# Patient Record
Sex: Female | Born: 1950 | Race: White | Hispanic: No | Marital: Married | Smoking: Never smoker
Health system: Southern US, Community
[De-identification: ages and names within clinical notes are randomized; demographics above are authoritative.]

## PROBLEM LIST (undated history)

## (undated) DIAGNOSIS — F329 Major depressive disorder, single episode, unspecified: Secondary | ICD-10-CM

## (undated) DIAGNOSIS — J45909 Unspecified asthma, uncomplicated: Secondary | ICD-10-CM

## (undated) DIAGNOSIS — N952 Postmenopausal atrophic vaginitis: Secondary | ICD-10-CM

## (undated) DIAGNOSIS — M5136 Other intervertebral disc degeneration, lumbar region: Secondary | ICD-10-CM

## (undated) DIAGNOSIS — F32A Depression, unspecified: Secondary | ICD-10-CM

## (undated) DIAGNOSIS — R32 Unspecified urinary incontinence: Secondary | ICD-10-CM

## (undated) DIAGNOSIS — J309 Allergic rhinitis, unspecified: Secondary | ICD-10-CM

## (undated) DIAGNOSIS — N289 Disorder of kidney and ureter, unspecified: Secondary | ICD-10-CM

## (undated) DIAGNOSIS — M5412 Radiculopathy, cervical region: Secondary | ICD-10-CM

## (undated) DIAGNOSIS — F419 Anxiety disorder, unspecified: Secondary | ICD-10-CM

## (undated) DIAGNOSIS — S83249A Other tear of medial meniscus, current injury, unspecified knee, initial encounter: Secondary | ICD-10-CM

## (undated) DIAGNOSIS — G4700 Insomnia, unspecified: Secondary | ICD-10-CM

## (undated) DIAGNOSIS — M51369 Other intervertebral disc degeneration, lumbar region without mention of lumbar back pain or lower extremity pain: Secondary | ICD-10-CM

## (undated) DIAGNOSIS — H269 Unspecified cataract: Secondary | ICD-10-CM

## (undated) HISTORY — DX: Unspecified cataract: H26.9

## (undated) HISTORY — DX: Insomnia, unspecified: G47.00

## (undated) HISTORY — PX: HAMMER TOE SURGERY: SHX385

## (undated) HISTORY — PX: BUNIONECTOMY: SHX129

## (undated) HISTORY — DX: Radiculopathy, cervical region: M54.12

## (undated) HISTORY — PX: CATARACT EXTRACTION: SUR2

## (undated) HISTORY — DX: Unspecified asthma, uncomplicated: J45.909

## (undated) HISTORY — DX: Major depressive disorder, single episode, unspecified: F32.9

## (undated) HISTORY — DX: Other tear of medial meniscus, current injury, unspecified knee, initial encounter: S83.249A

## (undated) HISTORY — DX: Postmenopausal atrophic vaginitis: N95.2

## (undated) HISTORY — PX: LAMINECTOMY: SHX219

## (undated) HISTORY — PX: PARTIAL HYSTERECTOMY: SHX80

## (undated) HISTORY — DX: Depression, unspecified: F32.A

## (undated) HISTORY — DX: Unspecified urinary incontinence: R32

## (undated) HISTORY — PX: MANDIBLE FRACTURE SURGERY: SHX706

## (undated) HISTORY — DX: Allergic rhinitis, unspecified: J30.9

## (undated) HISTORY — DX: Anxiety disorder, unspecified: F41.9

## (undated) HISTORY — PX: BLADDER SUSPENSION: SHX72

---

## 2004-04-01 ENCOUNTER — Other Ambulatory Visit: Payer: Self-pay

## 2004-04-25 ENCOUNTER — Other Ambulatory Visit: Payer: Self-pay

## 2005-03-10 ENCOUNTER — Ambulatory Visit: Payer: Self-pay | Admitting: Unknown Physician Specialty

## 2005-03-11 ENCOUNTER — Ambulatory Visit: Payer: Self-pay | Admitting: Unknown Physician Specialty

## 2005-06-30 ENCOUNTER — Emergency Department: Payer: Self-pay | Admitting: Emergency Medicine

## 2006-06-08 ENCOUNTER — Ambulatory Visit: Payer: Self-pay | Admitting: Unknown Physician Specialty

## 2006-10-22 ENCOUNTER — Other Ambulatory Visit: Payer: Self-pay

## 2006-10-22 ENCOUNTER — Inpatient Hospital Stay: Payer: Self-pay

## 2010-04-17 ENCOUNTER — Ambulatory Visit: Payer: Self-pay | Admitting: Family Medicine

## 2010-04-24 ENCOUNTER — Ambulatory Visit: Payer: Self-pay | Admitting: Nephrology

## 2011-07-11 ENCOUNTER — Ambulatory Visit: Payer: Self-pay | Admitting: Internal Medicine

## 2011-07-11 ENCOUNTER — Observation Stay: Payer: Self-pay | Admitting: Internal Medicine

## 2011-08-13 LAB — HM PAP SMEAR

## 2013-08-26 LAB — HM COLONOSCOPY

## 2013-09-04 ENCOUNTER — Ambulatory Visit: Payer: Self-pay | Admitting: Gastroenterology

## 2013-10-30 LAB — HM MAMMOGRAPHY

## 2014-01-10 ENCOUNTER — Ambulatory Visit: Payer: Self-pay | Admitting: Family Medicine

## 2014-01-16 ENCOUNTER — Ambulatory Visit: Payer: Self-pay | Admitting: Family Medicine

## 2014-02-05 ENCOUNTER — Ambulatory Visit: Payer: Self-pay | Admitting: Family Medicine

## 2014-03-01 ENCOUNTER — Other Ambulatory Visit: Payer: Self-pay | Admitting: Podiatry

## 2014-05-04 ENCOUNTER — Emergency Department: Payer: Self-pay | Admitting: Emergency Medicine

## 2014-05-04 LAB — COMPREHENSIVE METABOLIC PANEL
ALT: 17 U/L (ref 12–78)
Albumin: 4.1 g/dL (ref 3.4–5.0)
Alkaline Phosphatase: 58 U/L
Anion Gap: 8 (ref 7–16)
BUN: 13 mg/dL (ref 7–18)
Bilirubin,Total: 0.6 mg/dL (ref 0.2–1.0)
CHLORIDE: 101 mmol/L (ref 98–107)
CO2: 29 mmol/L (ref 21–32)
Calcium, Total: 9 mg/dL (ref 8.5–10.1)
Creatinine: 0.92 mg/dL (ref 0.60–1.30)
EGFR (Non-African Amer.): >60
Glucose: 88 mg/dL (ref 65–99)
OSMOLALITY: 275 (ref 275–301)
POTASSIUM: 3.9 mmol/L (ref 3.5–5.1)
SGOT(AST): 22 U/L (ref 15–37)
SODIUM: 138 mmol/L (ref 136–145)
TOTAL PROTEIN: 7.3 g/dL (ref 6.4–8.2)

## 2014-05-04 LAB — SALICYLATE LEVEL: Salicylates, Serum: <1.7 mg/dL

## 2014-05-04 LAB — TSH: Thyroid Stimulating Horm: 2.99 u[IU]/mL

## 2014-05-04 LAB — DRUG SCREEN, URINE

## 2014-05-04 LAB — CBC
HCT: 44.6 % (ref 35.0–47.0)
HGB: 14.2 g/dL (ref 12.0–16.0)
MCH: 26.9 pg (ref 26.0–34.0)
MCHC: 31.8 g/dL — AB (ref 32.0–36.0)
MCV: 85 fL (ref 80–100)
Platelet: 273 10*3/uL (ref 150–440)
RBC: 5.25 10*6/uL — AB (ref 3.80–5.20)
RDW: 14.3 % (ref 11.5–14.5)
WBC: 7 10*3/uL (ref 3.6–11.0)

## 2014-05-04 LAB — ETHANOL: Ethanol: <3 mg/dL

## 2014-05-04 LAB — ACETAMINOPHEN LEVEL

## 2014-09-13 ENCOUNTER — Emergency Department: Payer: Self-pay | Admitting: Emergency Medicine

## 2015-02-16 NOTE — Consult Note (Signed)
PATIENT NAME:  Sharon Collins, Sharon Collins MR#:  [id number] DATE OF BIRTH:  1950/12/28  DATE OF CONSULTATION:  05/04/2014  REFERRING PHYSICIAN:   CONSULTING PHYSICIAN:  Gonzella Lex, MD  IDENTIFYING INFORMATION AND REASON FOR CONSULTATION: A 64 year old woman came to the Emergency Room directly from her doctor's office after having a panic attack. Consultation for assessment of need of psychiatric intervention.   HISTORY OF PRESENT ILLNESS:  Patient interviewed in the Emergency Room. Apparently, she had gone to her primary care doctor that day and while in the office had a panic attack. Evidently, it was pretty dramatic and the doctor was disturbed and sent her to the Emergency Room. The patient says that she is having panic attacks maybe about 1 time a week, which is more frequent than usual. Describes them as being a feeling that she cannot breathe, extreme anxiety, go on for several minutes. Of note, the patient says that she has been out of her Klonopin for about 3-6 days before having this panic attack. Otherwise, she has been continuing to take her usual medicine. The patient reports she has no specific new stress. Chronically worries and feels anxious about her family in a general way. She is denying any suicidal or homicidal ideation whatsoever when I saw her.   PAST PSYCHIATRIC HISTORY: No previous hospitalizations. No history of suicide attempts. She has been treated with medication through Primary Care largely for her anxiety and mood problems and has been fairly stable on them.   FAMILY HISTORY: Father committed suicide.   PAST MEDICAL HISTORY: The patient has seasonal allergies, has some chronic arthritic pain.   SOCIAL HISTORY: Lives with her family. Seems to have a fairly large extended family, including adult children and her brother in the area.   CURRENT MEDICATIONS: Clonazepam 0.5 mg 1 to 2 a day p.r.n., but she has been out of it for a few days; Lamictal 200 mg per day; estradiol 1 mg  per day; Zoloft 50 mg per day; Singulair 10 mg at night; p.r.n. oxycodone for her chronic pain.   SUBSTANCE ABUSE HISTORY: Denies drinking. Denies any abuse of her prescription medicine.   ALLERGIES: CODEINE.   REVIEW OF SYSTEMS:  Currently states she is mildly anxious.  Denies suicidal ideation. Denies hallucinations.  Denies having a panic attack currently. No specific physical problems.  Chronic back and joint pain.   MENTAL STATUS EXAMINATION:  A patient who looks her stated age, cooperative and pleasant with the interview. Good eye contact. Psychomotor activity normal. Speech normal rate, tone, and volume. Affect mildly anxious but reactive. Mood stated as being okay. Thoughts are lucid. No loosening of associations or delusions noted. Denies auditory or visual hallucinations. Denies suicidal or homicidal ideation. Able to recall 3/3 objects immediately and at 1 minute. Alert and oriented x 4. Judgment and insight appear to be adequate.   VITAL SIGNS: In the Emergency Room, blood pressure 114/47, respirations 18, pulse 55, temperature 97.9.   LABORATORY RESULTS: TSH normal. Salicylates negative.  CBC: Slightly elevated red count but otherwise normal. Alcohol level negative. Chemistry panel all normal. Acetaminophen negative. Drug screen negative.   ASSESSMENT:  A 64 year old woman who has panic attacks occasionally on top of what sounds like probably generalized anxiety disorder. Has been getting medication from her primary care doctor. Some increased frequency recently part of which might be due to having run out of her clonazepam, not acutely dangerous, not requiring hospital level treatment. Has an appropriate and safe place to live.  TREATMENT PLAN: Psychiatric education completed with the patient. Suggest that she consider seeking out referral to a psychiatrist for therapy and more frequent management of her anxiety if she wishes.  Discussed the case with the Emergency Room doctor. The  patient can be discharged from the Emergency Room.  No new medication. No change to medication plan.   DIAGNOSIS, PRINCIPAL AND PRIMARY:  AXIS I: Panic disorder without agoraphobia.   SECONDARY DIAGNOSES:  AXIS I: Generalized anxiety disorder.  AXIS II: Deferred.  AXIS III: Chronic pain.  AXIS IV: Moderate from some family conflict.   AXIS V: Functioning at time of discharge, 55.   ____________________________ Gonzella Lex, MD jtc:dd D: 05/08/2014 00:52:30 ET T: 05/08/2014 04:29:01 ET JOB#: [id number]  cc: Gonzella Lex, MD, <Dictator> Gonzella Lex MD ELECTRONICALLY SIGNED 05/13/2014 12:39

## 2015-02-20 ENCOUNTER — Encounter: Payer: Self-pay | Admitting: *Deleted

## 2015-02-21 ENCOUNTER — Encounter: Payer: Self-pay | Admitting: *Deleted

## 2015-02-21 ENCOUNTER — Ambulatory Visit: Payer: Self-pay | Admitting: Cardiovascular Disease

## 2015-02-22 ENCOUNTER — Encounter: Payer: Self-pay | Admitting: *Deleted

## 2015-05-27 ENCOUNTER — Other Ambulatory Visit: Payer: Self-pay | Admitting: Unknown Physician Specialty

## 2015-05-27 ENCOUNTER — Telehealth: Payer: Self-pay | Admitting: Family Medicine

## 2015-05-27 MED ORDER — MONTELUKAST SODIUM 10 MG PO TABS: 10.0000 mg | ORAL_TABLET | Freq: Every day | ORAL | Status: DC

## 2015-05-27 NOTE — Telephone Encounter (Signed)
She had a years supply in [facility] sent to her local pharmacy, but she needs a new rx for a 90 day supply sent to her mail order pharmacy.

## 2015-05-27 NOTE — Telephone Encounter (Signed)
E-fax came through from [facility] for refill on: Rx: montelukast (SINGULAIR) 10 MG tablet Copy in basket.

## 2015-08-07 ENCOUNTER — Other Ambulatory Visit: Payer: Self-pay | Admitting: Family Medicine

## 2015-12-17 ENCOUNTER — Ambulatory Visit: Payer: Self-pay | Admitting: Family Medicine

## 2015-12-18 ENCOUNTER — Encounter: Payer: Self-pay | Admitting: Unknown Physician Specialty

## 2015-12-18 ENCOUNTER — Ambulatory Visit (INDEPENDENT_AMBULATORY_CARE_PROVIDER_SITE_OTHER): Payer: Self-pay | Admitting: Unknown Physician Specialty

## 2015-12-18 VITALS — BP 102/72 | HR 70 | Temp 98.6°F | Ht 63.0 in | Wt 160.6 lb

## 2015-12-18 DIAGNOSIS — J0100 Acute maxillary sinusitis, unspecified: Secondary | ICD-10-CM

## 2015-12-18 DIAGNOSIS — Z23 Encounter for immunization: Secondary | ICD-10-CM

## 2015-12-18 MED ORDER — AMOXICILLIN-POT CLAVULANATE 875-125 MG PO TABS: 1.0000 | ORAL_TABLET | Freq: Two times a day (BID) | ORAL | Status: DC

## 2015-12-18 MED ORDER — ETODOLAC 500 MG PO TABS: 500.0000 mg | ORAL_TABLET | Freq: Two times a day (BID) | ORAL | Status: DC

## 2015-12-18 NOTE — Progress Notes (Signed)
BP 102/72 mmHg  Pulse 70  Temp(Src) 98.6 F (37 C)  Ht 5\' 3"  (1.6 m)  Wt 160 lb 9.6 oz (72.848 kg)  BMI 28.46 kg/m2  SpO2 97%  LMP  (LMP Unknown)   Subjective:    Patient ID: [name], female    DOB: Aug 18, 1951, 65 y.o.   MRN: [id number]  HPI: [name] is a 65 y.o. female  Chief Complaint  Patient presents with  . URI    pt states she has had nasal congestion, sinus pressure, headache, cough, and teeth pressure. States symptoms started 3 weeks ago.    Sinusitis This is a new (x 3 weeks and noticed left with green mucous,pressure, and tooth pain) problem. The current episode started 1 to 4 weeks ago. The problem has been gradually worsening since onset. There has been no fever. The fever has been present for less than 1 day. The pain is moderate. Associated symptoms include congestion, coughing, ear pain, headaches, sneezing and a sore throat. Pertinent negatives include no chills. Past treatments include saline sprays. The treatment provided mild relief.   Back pain Needs a refill of Lodine. She is also taking Gabapentin.     Relevant past medical, surgical, family and social history reviewed and updated as indicated. Interim medical history since our last visit reviewed. Allergies and medications reviewed and updated.  Review of Systems  Constitutional: Negative for chills.  HENT: Positive for congestion, ear pain, sneezing and sore throat.   Respiratory: Positive for cough.   Neurological: Positive for headaches.    Per HPI unless specifically indicated above     Objective:    BP 102/72 mmHg  Pulse 70  Temp(Src) 98.6 F (37 C)  Ht 5\' 3"  (1.6 m)  Wt 160 lb 9.6 oz (72.848 kg)  BMI 28.46 kg/m2  SpO2 97%  LMP  (LMP Unknown)  Wt Readings from Last 3 Encounters:  12/18/15 160 lb 9.6 oz (72.848 kg)  01/30/15 160 lb (72.576 kg)    Physical Exam  Constitutional: She is oriented to person, place, and time. She appears well-developed and  well-nourished. No distress.  HENT:  Head: Normocephalic and atraumatic.  Right Ear: Tympanic membrane and ear canal normal.  Left Ear: Tympanic membrane and ear canal normal.  Nose: No rhinorrhea. Right sinus exhibits maxillary sinus tenderness. Right sinus exhibits no frontal sinus tenderness. Left sinus exhibits maxillary sinus tenderness. Left sinus exhibits no frontal sinus tenderness.  Eyes: Conjunctivae and lids are normal. Right eye exhibits no discharge. Left eye exhibits no discharge. No scleral icterus.  Cardiovascular: Normal rate and regular rhythm.   Pulmonary/Chest: Effort normal and breath sounds normal. No respiratory distress.  Abdominal: Normal appearance. There is no splenomegaly or hepatomegaly.  Musculoskeletal: Normal range of motion.  Neurological: She is alert and oriented to person, place, and time.  Skin: Skin is intact. No rash noted. No pallor.  Psychiatric: She has a normal mood and affect. Her behavior is normal. Judgment and thought content normal.    Results for orders placed or performed in visit on 12/17/15  HM MAMMOGRAPHY  Result Value Ref Range   [facility] Mammogram [facility]   HM COLONOSCOPY  Result Value Ref Range   HM Colonoscopy [facility]       Assessment & Plan:   Problem List Items Addressed This Visit    None    Visit Diagnoses    Acute maxillary sinusitis, recurrence not specified    -  Primary  Relevant Medications    amoxicillin-clavulanate (AUGMENTIN) 875-125 MG tablet        Follow up plan: Return if symptoms worsen or fail to improve.

## 2016-05-07 ENCOUNTER — Other Ambulatory Visit: Payer: Self-pay | Admitting: Unknown Physician Specialty

## 2016-05-11 ENCOUNTER — Other Ambulatory Visit: Payer: Self-pay | Admitting: Unknown Physician Specialty

## 2016-05-11 MED ORDER — ETODOLAC 500 MG PO TABS: 500.0000 mg | ORAL_TABLET | Freq: Two times a day (BID) | ORAL | Status: DC

## 2016-05-11 NOTE — Telephone Encounter (Signed)
Pt has appt scheduled for 05/18/16 and would like to know if she could have enough etodolac (LODINE) 500 MG tablet called in to her pharmacy to las until her appt.

## 2016-05-11 NOTE — Telephone Encounter (Signed)
Routing to provider  

## 2016-05-18 ENCOUNTER — Encounter: Payer: Self-pay | Admitting: Unknown Physician Specialty

## 2016-05-18 ENCOUNTER — Ambulatory Visit (INDEPENDENT_AMBULATORY_CARE_PROVIDER_SITE_OTHER): Payer: Self-pay | Admitting: Unknown Physician Specialty

## 2016-05-18 VITALS — BP 110/70 | Temp 98.0°F | Ht 63.3 in | Wt 164.6 lb

## 2016-05-18 DIAGNOSIS — Z5181 Encounter for therapeutic drug level monitoring: Secondary | ICD-10-CM

## 2016-05-18 DIAGNOSIS — M545 Low back pain: Secondary | ICD-10-CM

## 2016-05-18 MED ORDER — ETODOLAC 500 MG PO TABS: 500.0000 mg | ORAL_TABLET | Freq: Two times a day (BID) | ORAL | 3 refills | Status: DC

## 2016-05-18 MED ORDER — ONDANSETRON 4 MG PO TBDP: 4.0000 mg | ORAL_TABLET | Freq: Three times a day (TID) | ORAL | 3 refills | Status: DC | PRN

## 2016-05-18 NOTE — Progress Notes (Signed)
BP 110/70 (BP Location: Left Arm, Patient Position: Sitting, Cuff Size: Large)   Temp 98 F (36.7 C)   Ht 5' 3.3" (1.608 m)   Wt 164 lb 9.6 oz (74.7 kg)   LMP  (LMP Unknown)   SpO2 98%   BMI 28.88 kg/m    Subjective:    Patient ID: Sharon Collins, female    DOB: 12-Feb-1951, 65 y.o.   MRN: [id number]  HPI: Sharon Collins is a 65 y.o. female  Chief Complaint  Patient presents with  . Medication Refill    pt states she needs refill on etodolac and ondansetron   Pain Pt is here for follow up of her back pain.  She reports no changes.  She takes the Meyers for pain pretty much all over.  She has DDD and feels like bees are stinging in her hands and feet.  She takes Oxycodone every 2-3 weeks.  She would like a refill of Ondansetron that she needs to take in case she takes the Oxycodone.  She currently has no insurance and will turn 29 in November.    Relevant past medical, surgical, family and social history reviewed and updated as indicated. Interim medical history since our last visit reviewed. Allergies and medications reviewed and updated.  Review of Systems  Per HPI unless specifically indicated above     Objective:    BP 110/70 (BP Location: Left Arm, Patient Position: Sitting, Cuff Size: Large)   Temp 98 F (36.7 C)   Ht 5' 3.3" (1.608 m)   Wt 164 lb 9.6 oz (74.7 kg)   LMP  (LMP Unknown)   SpO2 98%   BMI 28.88 kg/m   Wt Readings from Last 3 Encounters:  05/18/16 164 lb 9.6 oz (74.7 kg)  12/18/15 160 lb 9.6 oz (72.8 kg)  01/30/15 160 lb (72.6 kg)    Physical Exam  Constitutional: She is oriented to person, place, and time. She appears well-developed and well-nourished.  HENT:  Head: Normocephalic and atraumatic.  Eyes: Pupils are equal, round, and reactive to light. Right eye exhibits no discharge. Left eye exhibits no discharge. No scleral icterus.  Neck: Normal range of motion. Neck supple. Carotid bruit is not present. No thyromegaly present.    Cardiovascular: Normal rate, regular rhythm and normal heart sounds.  Exam reveals no gallop and no friction rub.   No murmur heard. Pulmonary/Chest: Effort normal and breath sounds normal. No respiratory distress. She has no wheezes. She has no rales.  Abdominal: Soft. Bowel sounds are normal. There is no tenderness. There is no rebound.  Genitourinary: No breast swelling, tenderness or discharge.  Musculoskeletal: Normal range of motion.  Lymphadenopathy:    She has no cervical adenopathy.  Neurological: She is alert and oriented to person, place, and time.  Skin: Skin is warm, dry and intact. No rash noted.  Psychiatric: She has a normal mood and affect. Her speech is normal and behavior is normal. Judgment and thought content normal. Cognition and memory are normal.    Results for orders placed or performed in visit on 05/18/16  HM PAP SMEAR  Result Value Ref Range   HM Pap smear [facility]       Assessment & Plan:   Problem List Items Addressed This Visit    None    Visit Diagnoses    Low back pain without sciatica, unspecified back pain laterality    -  Primary   Relevant Medications   etodolac (LODINE) 500  MG tablet   Medication monitoring encounter       Relevant Orders   Comprehensive metabolic panel       Follow up plan: Return for physical in November.

## 2016-05-19 ENCOUNTER — Encounter: Payer: Self-pay | Admitting: Unknown Physician Specialty

## 2016-05-19 LAB — COMPREHENSIVE METABOLIC PANEL
A/G RATIO: 2.2 (ref 1.2–2.2)
ALBUMIN: 4.2 g/dL (ref 3.6–4.8)
ALT: 14 IU/L (ref 0–32)
AST: 19 IU/L (ref 0–40)
Alkaline Phosphatase: 64 IU/L (ref 39–117)
BILIRUBIN TOTAL: 0.2 mg/dL (ref 0.0–1.2)
BUN / CREAT RATIO: 11 — AB (ref 12–28)
BUN: 9 mg/dL (ref 8–27)
CHLORIDE: 100 mmol/L (ref 96–106)
CO2: 25 mmol/L (ref 18–29)
Calcium: 9.2 mg/dL (ref 8.7–10.3)
Creatinine, Ser: 0.83 mg/dL (ref 0.57–1.00)
GFR calc non Af Amer: 75 mL/min/{1.73_m2} (ref >59)
GFR, EST AFRICAN AMERICAN: 86 mL/min/{1.73_m2} (ref >59)
Globulin, Total: 1.9 g/dL (ref 1.5–4.5)
Glucose: 77 mg/dL (ref 65–99)
POTASSIUM: 4.1 mmol/L (ref 3.5–5.2)
Sodium: 141 mmol/L (ref 134–144)
TOTAL PROTEIN: 6.1 g/dL (ref 6.0–8.5)

## 2016-05-19 NOTE — Progress Notes (Signed)
Normal labs.  Patient notified by letter.

## 2016-09-01 DIAGNOSIS — H25043 Posterior subcapsular polar age-related cataract, bilateral: Secondary | ICD-10-CM | POA: Diagnosis not present

## 2016-09-01 DIAGNOSIS — H2512 Age-related nuclear cataract, left eye: Secondary | ICD-10-CM | POA: Diagnosis not present

## 2016-09-01 DIAGNOSIS — H18413 Arcus senilis, bilateral: Secondary | ICD-10-CM | POA: Diagnosis not present

## 2016-09-01 DIAGNOSIS — H02839 Dermatochalasis of unspecified eye, unspecified eyelid: Secondary | ICD-10-CM | POA: Diagnosis not present

## 2016-09-01 DIAGNOSIS — H2513 Age-related nuclear cataract, bilateral: Secondary | ICD-10-CM | POA: Diagnosis not present

## 2016-09-03 DIAGNOSIS — M47816 Spondylosis without myelopathy or radiculopathy, lumbar region: Secondary | ICD-10-CM | POA: Diagnosis not present

## 2016-09-03 DIAGNOSIS — M533 Sacrococcygeal disorders, not elsewhere classified: Secondary | ICD-10-CM | POA: Diagnosis not present

## 2016-09-03 DIAGNOSIS — Z79891 Long term (current) use of opiate analgesic: Secondary | ICD-10-CM | POA: Diagnosis not present

## 2016-09-03 DIAGNOSIS — M4722 Other spondylosis with radiculopathy, cervical region: Secondary | ICD-10-CM | POA: Diagnosis not present

## 2016-09-03 DIAGNOSIS — M961 Postlaminectomy syndrome, not elsewhere classified: Secondary | ICD-10-CM | POA: Diagnosis not present

## 2016-09-03 DIAGNOSIS — G894 Chronic pain syndrome: Secondary | ICD-10-CM | POA: Diagnosis not present

## 2016-09-03 DIAGNOSIS — M25562 Pain in left knee: Secondary | ICD-10-CM | POA: Diagnosis not present

## 2016-09-03 DIAGNOSIS — G8929 Other chronic pain: Secondary | ICD-10-CM | POA: Diagnosis not present

## 2016-09-04 ENCOUNTER — Ambulatory Visit: Payer: Self-pay | Admitting: Unknown Physician Specialty

## 2016-09-08 ENCOUNTER — Encounter (INDEPENDENT_AMBULATORY_CARE_PROVIDER_SITE_OTHER): Payer: Self-pay

## 2016-09-10 ENCOUNTER — Other Ambulatory Visit: Payer: Self-pay | Admitting: Unknown Physician Specialty

## 2016-09-10 NOTE — Telephone Encounter (Signed)
Routing to provider  

## 2016-09-10 NOTE — Telephone Encounter (Signed)
Pt would like a refill for albuterol (PROVENTIL HFA;VENTOLIN HFA) 108 (90 BASE) MCG/ACT inhaler sent to [facility] [address].

## 2016-09-11 MED ORDER — ALBUTEROL SULFATE HFA 108 (90 BASE) MCG/ACT IN AERS: 2.0000 | INHALATION_SPRAY | Freq: Four times a day (QID) | RESPIRATORY_TRACT | 1 refills | Status: DC | PRN

## 2016-09-13 DIAGNOSIS — H2511 Age-related nuclear cataract, right eye: Secondary | ICD-10-CM | POA: Diagnosis not present

## 2016-09-14 ENCOUNTER — Encounter: Payer: Self-pay | Admitting: Unknown Physician Specialty

## 2016-09-14 DIAGNOSIS — H2511 Age-related nuclear cataract, right eye: Secondary | ICD-10-CM | POA: Diagnosis not present

## 2016-09-15 DIAGNOSIS — H2512 Age-related nuclear cataract, left eye: Secondary | ICD-10-CM | POA: Diagnosis not present

## 2016-09-22 ENCOUNTER — Encounter: Payer: Self-pay | Admitting: Family Medicine

## 2016-09-22 ENCOUNTER — Ambulatory Visit (INDEPENDENT_AMBULATORY_CARE_PROVIDER_SITE_OTHER): Payer: PPO | Admitting: Family Medicine

## 2016-09-22 VITALS — BP 121/70 | HR 63 | Temp 98.8°F | Ht 63.25 in | Wt 158.0 lb

## 2016-09-22 DIAGNOSIS — Z131 Encounter for screening for diabetes mellitus: Secondary | ICD-10-CM | POA: Diagnosis not present

## 2016-09-22 DIAGNOSIS — Z23 Encounter for immunization: Secondary | ICD-10-CM

## 2016-09-22 DIAGNOSIS — Z1159 Encounter for screening for other viral diseases: Secondary | ICD-10-CM

## 2016-09-22 DIAGNOSIS — Z1231 Encounter for screening mammogram for malignant neoplasm of breast: Secondary | ICD-10-CM | POA: Diagnosis not present

## 2016-09-22 DIAGNOSIS — Z780 Asymptomatic menopausal state: Secondary | ICD-10-CM

## 2016-09-22 DIAGNOSIS — Z0000 Encounter for general adult medical examination without abnormal findings: Secondary | ICD-10-CM | POA: Diagnosis not present

## 2016-09-22 DIAGNOSIS — Z114 Encounter for screening for human immunodeficiency virus [HIV]: Secondary | ICD-10-CM | POA: Diagnosis not present

## 2016-09-22 MED ORDER — MONTELUKAST SODIUM 10 MG PO TABS: 10.0000 mg | ORAL_TABLET | Freq: Every day | ORAL | 3 refills | Status: DC

## 2016-09-22 MED ORDER — ALBUTEROL SULFATE HFA 108 (90 BASE) MCG/ACT IN AERS: 2.0000 | INHALATION_SPRAY | Freq: Four times a day (QID) | RESPIRATORY_TRACT | 12 refills | Status: DC | PRN

## 2016-09-22 NOTE — Progress Notes (Signed)
Subjective:    Sharon Collins is a 65 y.o. female who presents for a Welcome to Medicare exam.   Medicare metrics met. Would like EKG and screening labs today.   Review of Systems Negative         Objective:    Today's Vitals   09/22/16 0818  BP: 121/70  Pulse: 63  Temp: 98.8 F (37.1 C)  SpO2: 98%  Weight: 158 lb (71.7 kg)  Height: 5' 3.25" (1.607 m)  Body mass index is 27.77 kg/m.  Medications Outpatient Encounter Prescriptions as of 09/22/2016  Medication Sig  . albuterol (PROVENTIL HFA;VENTOLIN HFA) 108 (90 Base) MCG/ACT inhaler Inhale 2 puffs into the lungs every 6 (six) hours as needed for wheezing or shortness of breath.  . DUREZOL 0.05 % EMUL   . etodolac (LODINE) 500 MG tablet Take 1 tablet (500 mg total) by mouth 2 (two) times daily.  . fexofenadine (ALLEGRA) 180 MG tablet Take 180 mg by mouth daily.  Marland Kitchen gabapentin (NEURONTIN) 300 MG capsule Take 600 mg by mouth 3 (three) times daily.   Marland Kitchen lamoTRIgine (LAMICTAL) 100 MG tablet Take 100 mg by mouth daily.  . mometasone (NASONEX) 50 MCG/ACT nasal spray Place 2 sprays into the nose daily.  . montelukast (SINGULAIR) 10 MG tablet Take 1 tablet (10 mg total) by mouth at bedtime.  . ondansetron (ZOFRAN-ODT) 4 MG disintegrating tablet Take 1 tablet (4 mg total) by mouth every 8 (eight) hours as needed for nausea or vomiting.  Marland Kitchen oxyCODONE-acetaminophen (PERCOCET/ROXICET) 5-325 MG tablet Take by mouth 2 (two) times daily as needed.  Marland Kitchen PROLENSA 0.07 % SOLN   . sertraline (ZOLOFT) 100 MG tablet Take 100 mg by mouth daily.  . [DISCONTINUED] albuterol (PROVENTIL HFA;VENTOLIN HFA) 108 (90 Base) MCG/ACT inhaler Inhale 2 puffs into the lungs every 6 (six) hours as needed for wheezing or shortness of breath.  . [DISCONTINUED] montelukast (SINGULAIR) 10 MG tablet Take 1 tablet (10 mg total) by mouth at bedtime.  . [DISCONTINUED] oxycodone (OXY-IR) 5 MG capsule Take 5 mg by mouth every 6 (six) hours as needed.  . [DISCONTINUED]  estradiol (ESTRACE) 1 MG tablet Take 1 mg by mouth daily.   No facility-administered encounter medications on file as of 09/22/2016.      History: Past Medical History:  Diagnosis Date  . Acute medial meniscus tear    left  . Allergic rhinitis   . Anxiety   . Anxiety and depression   . Asthma   . Atrophic vaginitis   . Cervical radiculopathy   . Insomnia   . Urinary incontinence    Past Surgical History:  Procedure Laterality Date  . BUNIONECTOMY    . CATARACT EXTRACTION Right 08/2017  . HAMMER TOE SURGERY    . LAMINECTOMY    . MANDIBLE FRACTURE SURGERY    . MANDIBLE FRACTURE SURGERY    . TOTAL ABDOMINAL HYSTERECTOMY      Family History  Problem Relation Age of Onset  . Stroke Mother   . Hypertension Mother   . Thyroid disease Mother   . Cancer Father     Brain Tumor  . Hypertension Sister   . Thyroid disease Sister   . Hypertension Brother   . Mental illness Brother   . Thyroid disease Brother   . COPD Brother   . Heart disease Brother   . Cancer Maternal Aunt   . Cancer Maternal Grandmother   . Diabetes Neg Hx    Social History  Occupational History  . Not on file.   Social History Main Topics  . Smoking status: Never Smoker  . Smokeless tobacco: Never Used  . Alcohol use No  . Drug use: No  . Sexual activity: Not on file    Tobacco Counseling Counseling given: Not Answered   Immunizations and Health Maintenance Immunization History  Administered Date(s) Administered  . Influenza, High Dose Seasonal PF 09/22/2016  . Influenza,inj,Quad PF,36+ Mos 12/18/2015  . Influenza-Unspecified 07/19/2014  . Pneumococcal Conjugate-13 09/22/2016  . Td 10/26/2008  . Zoster 07/09/2014   Health Maintenance Due  Topic Date Due  . PAP SMEAR  08/12/2014  . MAMMOGRAM  10/31/2015  . DEXA SCAN  09/03/2016   Is the patient deaf or have difficulty hearing?: No Does the patient have difficulty seeing, even when wearing glasses/contacts?: Yes (had 1 cataract  removed in Nov., next in Dec,) Does the patient have difficulty concentrating, remembering, or making decisions?: No Does the patient have difficulty walking or climbing stairs?: No Does the patient have difficulty dressing or bathing?: No Does the patient have difficulty doing errands alone such as visiting a doctor's office or shopping?: No  Activities of Daily Living In your present state of health, do you have any difficulty performing the following activities: 09/22/2016 05/18/2016  Hearing? N Y  Vision? Y Y  Difficulty concentrating or making decisions? N N  Walking or climbing stairs? N N  Dressing or bathing? N N  Doing errands, shopping? N N  Some recent data might be hidden   Advanced Directives: does not have, will look into it      Assessment:    This is a routine wellness examination for this patient . Preventative services discussed and administered. Patient will schedule a general medical physical to address medical concerns.   Vision/Hearing screen  Visual Acuity Screening   Right eye Left eye Both eyes  Without correction:     With correction: 20/20 20/50    - Just had both eyes operated on this month d/t cataracts, being followed closely by Opthalmology currently  Dietary issues and exercise activities discussed:   Continue good diet and exercise regimen  Goals    None     Depression Screen PHQ 2/9 Scores 09/22/2016  PHQ - 2 Score 0  PHQ- 9 Score 5     Fall Risk Fall Risk  09/22/2016  Falls in the past year? No    Cognitive Function: MMSE - Mini Mental State Exam 09/22/2016  Orientation to time 5  Orientation to Place 5  Registration 3  Attention/ Calculation 5  Recall 3  Language- name 2 objects 2  Language- repeat 1  Language- follow 3 step command 3  Language- read & follow direction 1  Write a sentence 1  Copy design 1  Total score 30        Patient Care Team: Kathrine Haddock, NP as PCP - General (Nurse Practitioner)     Plan:       During the course of the visit the patient was educated and counseled about the following appropriate screening and preventive services:   Vaccines to include Pneumoccal, Influenza, Hepatitis B, Td, Zostavax, HCV  Electrocardiogram  Cardiovascular Disease  Colorectal cancer screening  Bone density screening  Diabetes screening  Glaucoma screening  Mammography/PAP  Nutrition counseling  Her current medications and allergies were reviewed and needed refills of her chronic medications were ordered. The plan for yearly health maintenance was discussed and all orders and referrals  were made as appropriate.  Patient Instructions (the written plan) was given to the patient.   Volney American, [place] 09/22/2016

## 2016-09-22 NOTE — Patient Instructions (Signed)
Follow-up for physical exam

## 2016-09-23 ENCOUNTER — Encounter: Payer: Self-pay | Admitting: Family Medicine

## 2016-09-23 LAB — HEPATITIS C ANTIBODY: Hep C Virus Ab: 0.1 s/co ratio (ref 0.0–0.9)

## 2016-09-23 LAB — HIV ANTIBODY (ROUTINE TESTING W REFLEX): HIV Screen 4th Generation wRfx: NONREACTIVE

## 2016-09-23 LAB — HEMOGLOBIN A1C
Est. average glucose Bld gHb Est-mCnc: 114 mg/dL
Hgb A1c MFr Bld: 5.6 % (ref 4.8–5.6)

## 2016-10-04 DIAGNOSIS — H25012 Cortical age-related cataract, left eye: Secondary | ICD-10-CM | POA: Diagnosis not present

## 2016-10-05 DIAGNOSIS — H2512 Age-related nuclear cataract, left eye: Secondary | ICD-10-CM | POA: Diagnosis not present

## 2016-10-20 ENCOUNTER — Telehealth: Payer: Self-pay | Admitting: Family Medicine

## 2016-10-20 ENCOUNTER — Ambulatory Visit (INDEPENDENT_AMBULATORY_CARE_PROVIDER_SITE_OTHER): Payer: PPO | Admitting: Family Medicine

## 2016-10-20 ENCOUNTER — Encounter: Payer: Self-pay | Admitting: Family Medicine

## 2016-10-20 VITALS — BP 110/72 | HR 74 | Temp 98.5°F | Wt 151.0 lb

## 2016-10-20 DIAGNOSIS — R399 Unspecified symptoms and signs involving the genitourinary system: Secondary | ICD-10-CM | POA: Diagnosis not present

## 2016-10-20 DIAGNOSIS — R339 Retention of urine, unspecified: Secondary | ICD-10-CM

## 2016-10-20 DIAGNOSIS — R059 Cough, unspecified: Secondary | ICD-10-CM

## 2016-10-20 DIAGNOSIS — R05 Cough: Secondary | ICD-10-CM

## 2016-10-20 MED ORDER — BENZONATATE 100 MG PO CAPS: 200.0000 mg | ORAL_CAPSULE | Freq: Three times a day (TID) | ORAL | 0 refills | Status: DC | PRN

## 2016-10-20 MED ORDER — SULFAMETHOXAZOLE-TRIMETHOPRIM 800-160 MG PO TABS: 1.0000 | ORAL_TABLET | Freq: Two times a day (BID) | ORAL | 0 refills | Status: DC

## 2016-10-20 NOTE — Progress Notes (Signed)
BP 110/72   Pulse 74   Temp 98.5 F (36.9 C)   Wt 151 lb (68.5 kg)   LMP  (LMP Unknown)   SpO2 100%   BMI 26.54 kg/m    Subjective:    Patient ID: Sharon Collins, female    DOB: 1951-08-08, 65 y.o.   MRN: [id number]  HPI: [name] is a 65 y.o. female  Chief Complaint  Patient presents with  . Urinary Tract Infection    lower back pain x 2 days, feels bad. States she never has urinary symptoms.    Patient presents with low back pain and malaise x 2 days. States she never has urinary symptoms with her UTIs, and that they always present like this for her. Denies urinating since Sunday evening (over 36 hours ago). Having lower abdominal pressure and discomfort. States she used to have to catheterize herself frequently at home following a hysterectomy in 2000 but has not had to in a while and does not have any catheters at home. No fever, but does feel chills and sweats intermittently. Not currently taking anything for sxs.  Also c/o productive cough x 2-3 weeks without other URI sxs. Has not been taking anything OTC for this. Denies SOB, CP, or wheezing.   Relevant past medical, surgical, family and social history reviewed and updated as indicated. Interim medical history since our last visit reviewed. Allergies and medications reviewed and updated.  Review of Systems  Constitutional: Negative.   HENT: Negative.   Respiratory: Positive for cough.   Cardiovascular: Negative.   Gastrointestinal: Negative.   Genitourinary: Positive for difficulty urinating.  Musculoskeletal: Positive for back pain.  Skin: Negative.   Neurological: Negative.   Psychiatric/Behavioral: Negative.     Per HPI unless specifically indicated above     Objective:    BP 110/72   Pulse 74   Temp 98.5 F (36.9 C)   Wt 151 lb (68.5 kg)   LMP  (LMP Unknown)   SpO2 100%   BMI 26.54 kg/m   Wt Readings from Last 3 Encounters:  10/20/16 151 lb (68.5 kg)  09/22/16 158 lb (71.7 kg)    05/18/16 164 lb 9.6 oz (74.7 kg)    Physical Exam  Constitutional: She is oriented to person, place, and time. She appears well-developed and well-nourished.  HENT:  Head: Atraumatic.  Eyes: Conjunctivae are normal. Pupils are equal, round, and reactive to light.  Neck: Normal range of motion. Neck supple.  Cardiovascular: Normal rate.   Pulmonary/Chest: Effort normal and breath sounds normal. No respiratory distress. She has no wheezes. She has no rales.  Abdominal: Soft. Bowel sounds are normal.  Some lower abdominal fullness to palpation from bladder distention. Mild ttp in this area  Musculoskeletal: Normal range of motion.  No CVA tenderness  Neurological: She is alert and oriented to person, place, and time.  Skin: Skin is warm and dry.  Psychiatric: She has a normal mood and affect. Her behavior is normal.  Nursing note and vitals reviewed.      Assessment & Plan:   Problem List Items Addressed This Visit    None    Visit Diagnoses    Urinary retention    -  Primary   Unable to leave specimen after 3 water glasses, will drop one off once she is able.    Relevant Orders   UA/M w/rflx Culture, Routine (STAT)   Cough       Tessalon perles sent for cough. Can  also take mucinex or delsym OTC. Follow up if worsening or no improvement.     Long discussion with patient about my recommendation of going to the ER for a catheterization. Patient states she does not want to sit and wait all day there and will just go home and continue drinking water until she is able to go. Reiterated my recommendation. Await specimen.   Follow up plan: Return if symptoms worsen or fail to improve.

## 2016-10-20 NOTE — Telephone Encounter (Signed)
Please call pt and let her know that her urine did show a UTI. Will call in some abx for her. Follow up if no relief.

## 2016-10-20 NOTE — Patient Instructions (Signed)
Go to the ER for further evaluation of your urinary retention

## 2016-10-20 NOTE — Telephone Encounter (Signed)
Patient notified

## 2016-10-23 LAB — UA/M W/RFLX CULTURE, ROUTINE
BILIRUBIN UA: NEGATIVE
Glucose, UA: NEGATIVE
Nitrite, UA: POSITIVE — AB
PH UA: 5.5 (ref 5.0–7.5)
Protein, UA: NEGATIVE
Specific Gravity, UA: 1.015 (ref 1.005–1.030)
Urobilinogen, Ur: 0.2 mg/dL (ref 0.2–1.0)

## 2016-10-23 LAB — URINE CULTURE, REFLEX

## 2016-10-23 LAB — MICROSCOPIC EXAMINATION: RBC MICROSCOPIC, UA: NONE SEEN /HPF (ref 0–?)

## 2016-11-23 ENCOUNTER — Encounter: Payer: Self-pay | Admitting: Primary Care

## 2016-11-23 ENCOUNTER — Ambulatory Visit (INDEPENDENT_AMBULATORY_CARE_PROVIDER_SITE_OTHER): Payer: PPO | Admitting: Primary Care

## 2016-11-23 VITALS — BP 116/82 | HR 60 | Temp 98.1°F | Ht 63.0 in | Wt 156.8 lb

## 2016-11-23 DIAGNOSIS — F32A Depression, unspecified: Secondary | ICD-10-CM | POA: Insufficient documentation

## 2016-11-23 DIAGNOSIS — J3089 Other allergic rhinitis: Secondary | ICD-10-CM | POA: Diagnosis not present

## 2016-11-23 DIAGNOSIS — F418 Other specified anxiety disorders: Secondary | ICD-10-CM

## 2016-11-23 DIAGNOSIS — F419 Anxiety disorder, unspecified: Principal | ICD-10-CM

## 2016-11-23 DIAGNOSIS — G8929 Other chronic pain: Secondary | ICD-10-CM | POA: Diagnosis not present

## 2016-11-23 DIAGNOSIS — M549 Dorsalgia, unspecified: Secondary | ICD-10-CM | POA: Diagnosis not present

## 2016-11-23 DIAGNOSIS — F329 Major depressive disorder, single episode, unspecified: Secondary | ICD-10-CM | POA: Insufficient documentation

## 2016-11-23 DIAGNOSIS — J309 Allergic rhinitis, unspecified: Secondary | ICD-10-CM | POA: Insufficient documentation

## 2016-11-23 MED ORDER — SERTRALINE HCL 100 MG PO TABS: 100.0000 mg | ORAL_TABLET | Freq: Every day | ORAL | 0 refills | Status: DC

## 2016-11-23 MED ORDER — LAMOTRIGINE 100 MG PO TABS: 100.0000 mg | ORAL_TABLET | Freq: Every day | ORAL | 0 refills | Status: DC

## 2016-11-23 NOTE — Patient Instructions (Addendum)
You will be contacted regarding your referral to psychiatry.  Please let us know if you have not heard back within one week.   I sent refills of Lamictal and Zoloft to your pharmacy. Your new psychiatrist will refill this medication moving forward.  Please schedule a physical with me at your convenience. You may also schedule a lab only appointment 3-4 days prior. We will discuss your lab results in detail during your physical.  It was a pleasure to meet you today! Please don't hesitate to call me with any questions. Welcome to [facility]!

## 2016-11-23 NOTE — Assessment & Plan Note (Signed)
Long history of, also with asthma. Discussed treatment options, she is not yet ready to see an allergist I will notify when ready. She requests to see Laren Boom when ready. Continue Singulair, Allegra, Nasonex. Exam today stable.

## 2016-11-23 NOTE — Assessment & Plan Note (Signed)
Stable on Zoloft and Lamictal, short-term refill provided today. Referral to psychiatry placed for further management. Denies SI/HI.

## 2016-11-23 NOTE — Assessment & Plan Note (Signed)
History of degenerative disc disease, chronic knee pain. Following with pain management through [facility].

## 2016-11-23 NOTE — Progress Notes (Signed)
Subjective:    Patient ID: Sharon Collins, female    DOB: Jun 09, 1951, 66 y.o.   MRN: [id number]  HPI  Sharon Collins is a 66 year old female who presents today to establish care and discuss the problems mentioned below. Will obtain old records.  1) Anxiety and Depression: Currently managed on Lamictal 100 mg and Zoloft 100 mg. Previously following with a psychiatrist through [facility], but has not seen the psychiatrist since August 2017. She is nearly out of her medications. She is requesting a referral to a local psychiatrist as [facility] is a far drive. She feels well managed overall, denies SI/HI.  2) Chronic Pain: Mostly located to her back, history of degenerative disc disease and torn meniscus to her left knee and chronic right knee pain. Currently managed on Percocet, etodolac, and gabapentin. She requires ondansetron as she experiences nausea with Percocet. She is currently following with pain management through [facility].   3) Allergic Rhinitis: Chronic. Managed on Singulair, Nasonex, Allegra, albuterol inhaler. She would like to see an allergist as she doesn't feel as she's well managed. She wakes up every morning with moderate accumulation of mucous in her chest which takes her a while to cough up. She does have a history of acid reflux but denies symptoms recently. She does have a history of Asthma and was managed on Advair, prednisone, and nebulized treatments at 1.. Her asthma has not bothered her in years. She is not quite ready to see allergist this time, but would like to see one within the next several months.  Review of Systems  Constitutional: Negative for fever.  HENT: Positive for congestion and postnasal drip. Negative for sore throat.   Respiratory: Negative for cough, shortness of breath and wheezing.   Cardiovascular: Negative for chest pain.  Musculoskeletal: Positive for back pain.       Chronic pain  Allergic/Immunologic: Positive for  environmental allergies.  Psychiatric/Behavioral: Negative for sleep disturbance and suicidal ideas. The patient is not nervous/anxious.        Feels well managed       Past Medical History:  Diagnosis Date  . Acute medial meniscus tear    left  . Allergic rhinitis   . Anxiety   . Anxiety and depression   . Asthma   . Atrophic vaginitis   . Cataracts, bilateral   . Cervical radiculopathy   . Insomnia   . Urinary incontinence      Social History   Social History  . Marital status: Married    Spouse name: N/A  . Number of children: N/A  . Years of education: N/A   Occupational History  . Not on file.   Social History Main Topics  . Smoking status: Never Smoker  . Smokeless tobacco: Never Used  . Alcohol use No  . Drug use: No  . Sexual activity: Not on file   Other Topics Concern  . Not on file   Social History Narrative   Married.   3 children, 11 grandchildren, 1 great grand child.   Retired. Worked at [employer], [place], [place], [place].   Enjoys spending time with her family.    Past Surgical History:  Procedure Laterality Date  . BUNIONECTOMY    . CATARACT EXTRACTION Right 08/2017  . HAMMER TOE SURGERY    . LAMINECTOMY    . MANDIBLE FRACTURE SURGERY    . MANDIBLE FRACTURE SURGERY    . TOTAL ABDOMINAL HYSTERECTOMY  Family History  Problem Relation Age of Onset  . Stroke Mother   . Hypertension Mother   . Thyroid disease Mother   . Cancer Father     Brain Tumor  . Hypertension Sister   . Thyroid disease Sister   . Hypertension Brother   . Mental illness Brother   . Thyroid disease Brother   . COPD Brother   . Heart disease Brother   . Cancer Maternal Aunt   . Cancer Maternal Grandmother   . Diabetes Neg Hx     Allergies  Allergen Reactions  . Codeine   . Hydrocodone Nausea And Vomiting    Vicodin causes vomiting, but when takes Phenergan 0.25 mg with it, she can tolerate it.  . Tramadol Nausea And Vomiting    Causes  vomiting & affects her heart rhythm.    Current Outpatient Prescriptions on File Prior to Visit  Medication Sig Dispense Refill  . albuterol (PROVENTIL HFA;VENTOLIN HFA) 108 (90 Base) MCG/ACT inhaler Inhale 2 puffs into the lungs every 6 (six) hours as needed for wheezing or shortness of breath. 1 Inhaler 12  . etodolac (LODINE) 500 MG tablet Take 1 tablet (500 mg total) by mouth 2 (two) times daily. 60 tablet 3  . fexofenadine (ALLEGRA) 180 MG tablet Take 180 mg by mouth daily.    Marland Kitchen gabapentin (NEURONTIN) 300 MG capsule Take 600 mg by mouth 3 (three) times daily.     . mometasone (NASONEX) 50 MCG/ACT nasal spray Place 2 sprays into the nose daily.    . montelukast (SINGULAIR) 10 MG tablet Take 1 tablet (10 mg total) by mouth at bedtime. 90 tablet 3  . PROLENSA 0.07 % SOLN     . benzonatate (TESSALON) 100 MG capsule Take 2 capsules (200 mg total) by mouth 3 (three) times daily as needed. (Patient not taking: Reported on 11/23/2016) 45 capsule 0  . ondansetron (ZOFRAN-ODT) 4 MG disintegrating tablet Take 1 tablet (4 mg total) by mouth every 8 (eight) hours as needed for nausea or vomiting. (Patient not taking: Reported on 11/23/2016) 20 tablet 3  . oxyCODONE-acetaminophen (PERCOCET/ROXICET) 5-325 MG tablet Take by mouth 2 (two) times daily as needed.     No current facility-administered medications on file prior to visit.     BP 116/82   Pulse 60   Temp 98.1 F (36.7 C) (Oral)   Ht 5\' 3"  (1.6 m)   Wt 156 lb 12.8 oz (71.1 kg)   LMP  (LMP Unknown)   SpO2 97%   BMI 27.78 kg/m    Objective:   Physical Exam  Constitutional: She appears well-nourished.  HENT:  Nose: No mucosal edema. Right sinus exhibits no maxillary sinus tenderness and no frontal sinus tenderness. Left sinus exhibits no maxillary sinus tenderness and no frontal sinus tenderness.  Mouth/Throat: No posterior oropharyngeal erythema.  Neck: Neck supple.  Cardiovascular: Normal rate and regular rhythm.   Pulmonary/Chest:  Effort normal and breath sounds normal.  Skin: Skin is warm and dry.  Psychiatric: She has a normal mood and affect.          Assessment & Plan:

## 2016-11-23 NOTE — Progress Notes (Signed)
Pre visit review using our clinic review tool, if applicable. No additional management support is needed unless otherwise documented below in the visit note. 

## 2016-12-01 ENCOUNTER — Encounter: Payer: PPO | Admitting: Unknown Physician Specialty

## 2016-12-02 DIAGNOSIS — M533 Sacrococcygeal disorders, not elsewhere classified: Secondary | ICD-10-CM | POA: Diagnosis not present

## 2016-12-02 DIAGNOSIS — Z7951 Long term (current) use of inhaled steroids: Secondary | ICD-10-CM | POA: Diagnosis not present

## 2016-12-02 DIAGNOSIS — G894 Chronic pain syndrome: Secondary | ICD-10-CM | POA: Diagnosis not present

## 2016-12-02 DIAGNOSIS — M542 Cervicalgia: Secondary | ICD-10-CM | POA: Diagnosis not present

## 2016-12-02 DIAGNOSIS — G8929 Other chronic pain: Secondary | ICD-10-CM | POA: Diagnosis not present

## 2016-12-02 DIAGNOSIS — F419 Anxiety disorder, unspecified: Secondary | ICD-10-CM | POA: Diagnosis not present

## 2016-12-02 DIAGNOSIS — M4722 Other spondylosis with radiculopathy, cervical region: Secondary | ICD-10-CM | POA: Diagnosis not present

## 2016-12-02 DIAGNOSIS — M961 Postlaminectomy syndrome, not elsewhere classified: Secondary | ICD-10-CM | POA: Diagnosis not present

## 2016-12-02 DIAGNOSIS — M25562 Pain in left knee: Secondary | ICD-10-CM | POA: Diagnosis not present

## 2016-12-02 DIAGNOSIS — Z79891 Long term (current) use of opiate analgesic: Secondary | ICD-10-CM | POA: Diagnosis not present

## 2016-12-02 DIAGNOSIS — M47816 Spondylosis without myelopathy or radiculopathy, lumbar region: Secondary | ICD-10-CM | POA: Diagnosis not present

## 2016-12-02 DIAGNOSIS — M545 Low back pain: Secondary | ICD-10-CM | POA: Diagnosis not present

## 2016-12-02 DIAGNOSIS — Z8739 Personal history of other diseases of the musculoskeletal system and connective tissue: Secondary | ICD-10-CM | POA: Diagnosis not present

## 2016-12-02 DIAGNOSIS — Z79899 Other long term (current) drug therapy: Secondary | ICD-10-CM | POA: Diagnosis not present

## 2016-12-02 DIAGNOSIS — M532X8 Spinal instabilities, sacral and sacrococcygeal region: Secondary | ICD-10-CM | POA: Diagnosis not present

## 2016-12-02 DIAGNOSIS — Z791 Long term (current) use of non-steroidal anti-inflammatories (NSAID): Secondary | ICD-10-CM | POA: Diagnosis not present

## 2016-12-02 DIAGNOSIS — Z6828 Body mass index (BMI) 28.0-28.9, adult: Secondary | ICD-10-CM | POA: Diagnosis not present

## 2016-12-11 ENCOUNTER — Telehealth: Payer: Self-pay

## 2016-12-11 NOTE — Telephone Encounter (Signed)
Noted  

## 2016-12-11 NOTE — Telephone Encounter (Signed)
Left msg on patient's mobile phone advising her that AWV and CPE have been cancelled. Pt recently had a Welcome to Medicare Exam on 09/22/2016.

## 2016-12-14 ENCOUNTER — Ambulatory Visit: Payer: PPO

## 2016-12-16 ENCOUNTER — Encounter: Payer: PPO | Admitting: Primary Care

## 2017-01-06 ENCOUNTER — Ambulatory Visit (INDEPENDENT_AMBULATORY_CARE_PROVIDER_SITE_OTHER): Payer: PPO | Admitting: Primary Care

## 2017-01-06 ENCOUNTER — Encounter: Payer: Self-pay | Admitting: Primary Care

## 2017-01-06 ENCOUNTER — Encounter (INDEPENDENT_AMBULATORY_CARE_PROVIDER_SITE_OTHER): Payer: Self-pay

## 2017-01-06 VITALS — BP 116/72 | HR 71 | Temp 98.0°F | Ht 63.25 in | Wt 160.4 lb

## 2017-01-06 DIAGNOSIS — J3089 Other allergic rhinitis: Secondary | ICD-10-CM | POA: Diagnosis not present

## 2017-01-06 DIAGNOSIS — R109 Unspecified abdominal pain: Secondary | ICD-10-CM

## 2017-01-06 DIAGNOSIS — J069 Acute upper respiratory infection, unspecified: Secondary | ICD-10-CM

## 2017-01-06 LAB — POC URINALSYSI DIPSTICK (AUTOMATED)
Bilirubin, UA: NEGATIVE
Blood, UA: NEGATIVE
GLUCOSE UA: NEGATIVE
Ketones, UA: NEGATIVE
LEUKOCYTES UA: NEGATIVE
NITRITE UA: NEGATIVE
Protein, UA: NEGATIVE
Spec Grav, UA: 1.03
Urobilinogen, UA: NEGATIVE
pH, UA: 5.5

## 2017-01-06 MED ORDER — BENZONATATE 200 MG PO CAPS: 200.0000 mg | ORAL_CAPSULE | Freq: Three times a day (TID) | ORAL | 0 refills | Status: DC | PRN

## 2017-01-06 MED ORDER — MONTELUKAST SODIUM 10 MG PO TABS: 10.0000 mg | ORAL_TABLET | Freq: Every day | ORAL | 3 refills | Status: DC

## 2017-01-06 MED ORDER — MOMETASONE FUROATE 50 MCG/ACT NA SUSP: NASAL | 3 refills | Status: DC

## 2017-01-06 NOTE — Patient Instructions (Signed)
Your symptoms are representative of a viral illness which will resolve on its own over time. Our goal is to treat your symptoms in order to aid your body in the healing process and to make you more comfortable.   You may take Benzonatate capsules for cough. Take 1 capsule by mouth three times daily as needed for cough.  Cough/Congestion: Try taking Mucinex DM. This will help loosen up the mucous in your chest. Ensure you take this medication with a full glass of water.  Continue Singulair, Allegra, and Nasonex.   Please notify me if you develop persistent fevers of 101, start coughing up green mucous, notice increased fatigue or weakness, or feel worse after next Monday.  Increase consumption of water intake and rest.  It was a pleasure to see you today!   Upper Respiratory Infection, Adult Most upper respiratory infections (URIs) are a viral infection of the air passages leading to the lungs. A URI affects the nose, throat, and upper air passages. The most common type of URI is nasopharyngitis and is typically referred to as "the common cold." URIs run their course and usually go away on their own. Most of the time, a URI does not require medical attention, but sometimes a bacterial infection in the upper airways can follow a viral infection. This is called a secondary infection. Sinus and middle ear infections are common types of secondary upper respiratory infections. Bacterial pneumonia can also complicate a URI. A URI can worsen asthma and chronic obstructive pulmonary disease (COPD). Sometimes, these complications can require emergency medical care and may be life threatening. What are the causes? Almost all URIs are caused by viruses. A virus is a type of germ and can spread from one person to another. What increases the risk? You may be at risk for a URI if:  You smoke.  You have chronic heart or lung disease.  You have a weakened defense (immune) system.  You are very young or  very old.  You have nasal allergies or asthma.  You work in crowded or poorly ventilated areas.  You work in health care facilities or schools. What are the signs or symptoms? Symptoms typically develop 2-3 days after you come in contact with a cold virus. Most viral URIs last 7-10 days. However, viral URIs from the influenza virus (flu virus) can last 14-18 days and are typically more severe. Symptoms may include:  Runny or stuffy (congested) nose.  Sneezing.  Cough.  Sore throat.  Headache.  Fatigue.  Fever.  Loss of appetite.  Pain in your forehead, behind your eyes, and over your cheekbones (sinus pain).  Muscle aches. How is this diagnosed? Your health care provider may diagnose a URI by:  Physical exam.  Tests to check that your symptoms are not due to another condition such as:  Strep throat.  Sinusitis.  Pneumonia.  Asthma. How is this treated? A URI goes away on its own with time. It cannot be cured with medicines, but medicines may be prescribed or recommended to relieve symptoms. Medicines may help:  Reduce your fever.  Reduce your cough.  Relieve nasal congestion. Follow these instructions at home:  Take medicines only as directed by your health care provider.  Gargle warm saltwater or take cough drops to comfort your throat as directed by your health care provider.  Use a warm mist humidifier or inhale steam from a shower to increase air moisture. This may make it easier to breathe.  Drink enough fluid to  keep your urine clear or pale yellow.  Eat soups and other clear broths and maintain good nutrition.  Rest as needed.  Return to work when your temperature has returned to normal or as your health care provider advises. You may need to stay home longer to avoid infecting others. You can also use a face mask and careful hand washing to prevent spread of the virus.  Increase the usage of your inhaler if you have asthma.  Do not use any  tobacco products, including cigarettes, chewing tobacco, or electronic cigarettes. If you need help quitting, ask your health care provider. How is this prevented? The best way to protect yourself from getting a cold is to practice good hygiene.  Avoid oral or hand contact with people with cold symptoms.  Wash your hands often if contact occurs. There is no clear evidence that vitamin C, vitamin E, echinacea, or exercise reduces the chance of developing a cold. However, it is always recommended to get plenty of rest, exercise, and practice good nutrition. Contact a health care provider if:  You are getting worse rather than better.  Your symptoms are not controlled by medicine.  You have chills.  You have worsening shortness of breath.  You have brown or red mucus.  You have yellow or brown nasal discharge.  You have pain in your face, especially when you bend forward.  You have a fever.  You have swollen neck glands.  You have pain while swallowing.  You have white areas in the back of your throat. Get help right away if:  You have severe or persistent:  Headache.  Ear pain.  Sinus pain.  Chest pain.  You have chronic lung disease and any of the following:  Wheezing.  Prolonged cough.  Coughing up blood.  A change in your usual mucus.  You have a stiff neck.  You have changes in your:  Vision.  Hearing.  Thinking.  Mood. This information is not intended to replace advice given to you by your health care provider. Make sure you discuss any questions you have with your health care provider. Document Released: 04/07/2001 Document Revised: 06/14/2016 Document Reviewed: 01/17/2014 Elsevier Interactive Patient Education  2017 [employer].

## 2017-01-06 NOTE — Progress Notes (Signed)
Pre visit review using our clinic review tool, if applicable. No additional management support is needed unless otherwise documented below in the visit note. 

## 2017-01-06 NOTE — Progress Notes (Signed)
Subjective:    Patient ID: Sharon Collins, female    DOB: 06/05/1951, 66 y.o.   MRN: [id number]  HPI  Ms. Sharon Collins is a 66 year old female with a history of allergic rhinits who presents today with multiple complaints.   1) Cough: Also with chest congestion, ear pain, ear fullness. Her cough is chronic from allergies but this is different. She denies nasal congestion, SOB, headaches. She's been taking Singulair, Allegra, and Nasonex. She's not used her albuterol inhaler as she's not felt as though she needs it. Her cough is productive with yellow sputum. Her symptoms have been present for the past 2 days.   2) Low Back Pain: Located to right buttocks and lower back with radiation to her right groin. She denies hematuria, dysuria, nausea, vomiting. She has noticed nocturia. The pain is worse with urinating. She's taken Percocet for her pain with improvement. She does have history of UTI and renal stones.  Review of Systems  Constitutional: Negative for chills and fever.  HENT: Positive for congestion and ear pain. Negative for sinus pressure and sore throat.   Respiratory: Positive for cough. Negative for shortness of breath and wheezing.   Cardiovascular: Negative for chest pain.  Genitourinary: Positive for frequency. Negative for dysuria, hematuria and vaginal discharge.  Musculoskeletal: Positive for back pain.  Neurological: Negative for numbness.       Past Medical History:  Diagnosis Date  . Acute medial meniscus tear    left  . Allergic rhinitis   . Anxiety   . Anxiety and depression   . Asthma   . Atrophic vaginitis   . Cataracts, bilateral   . Cervical radiculopathy   . Insomnia   . Urinary incontinence      Social History   Social History  . Marital status: Married    Spouse name: N/A  . Number of children: N/A  . Years of education: N/A   Occupational History  . Not on file.   Social History Main Topics  . Smoking status: Never Smoker  . Smokeless  tobacco: Never Used  . Alcohol use No  . Drug use: No  . Sexual activity: Not on file   Other Topics Concern  . Not on file   Social History Narrative   Married.   3 children, 11 grandchildren, 1 great grand child.   Retired. Worked at [employer], [place], [place], [place].   Enjoys spending time with her family.    Past Surgical History:  Procedure Laterality Date  . BUNIONECTOMY    . CATARACT EXTRACTION Right 08/2017  . HAMMER TOE SURGERY    . LAMINECTOMY    . MANDIBLE FRACTURE SURGERY    . MANDIBLE FRACTURE SURGERY    . TOTAL ABDOMINAL HYSTERECTOMY      Family History  Problem Relation Age of Onset  . Stroke Mother   . Hypertension Mother   . Thyroid disease Mother   . Cancer Father     Brain Tumor  . Hypertension Sister   . Thyroid disease Sister   . Hypertension Brother   . Mental illness Brother   . Thyroid disease Brother   . COPD Brother   . Heart disease Brother   . Cancer Maternal Aunt   . Cancer Maternal Grandmother   . Diabetes Neg Hx     Allergies  Allergen Reactions  . Codeine   . Hydrocodone Nausea And Vomiting    Vicodin causes vomiting, but when takes Phenergan 0.25 mg  with it, she can tolerate it.  . Tramadol Nausea And Vomiting    Causes vomiting & affects her heart rhythm.    Current Outpatient Prescriptions on File Prior to Visit  Medication Sig Dispense Refill  . albuterol (PROVENTIL HFA;VENTOLIN HFA) 108 (90 Base) MCG/ACT inhaler Inhale 2 puffs into the lungs every 6 (six) hours as needed for wheezing or shortness of breath. 1 Inhaler 12  . etodolac (LODINE) 500 MG tablet Take 1 tablet (500 mg total) by mouth 2 (two) times daily. 60 tablet 3  . fexofenadine (ALLEGRA) 180 MG tablet Take 180 mg by mouth daily.    Marland Kitchen gabapentin (NEURONTIN) 300 MG capsule Take 600 mg by mouth 3 (three) times daily.     Marland Kitchen lamoTRIgine (LAMICTAL) 100 MG tablet Take 1 tablet (100 mg total) by mouth daily. 90 tablet 0  . ondansetron (ZOFRAN-ODT) 4 MG  disintegrating tablet Take 1 tablet (4 mg total) by mouth every 8 (eight) hours as needed for nausea or vomiting. 20 tablet 3  . oxyCODONE-acetaminophen (PERCOCET/ROXICET) 5-325 MG tablet Take by mouth 2 (two) times daily as needed.    . sertraline (ZOLOFT) 100 MG tablet Take 1 tablet (100 mg total) by mouth daily. 90 tablet 0   No current facility-administered medications on file prior to visit.     BP 116/72   Pulse 71   Temp 98 F (36.7 C) (Oral)   Ht 5' 3.25" (1.607 m)   Wt 160 lb 6.4 oz (72.8 kg)   LMP  (LMP Unknown)   SpO2 98%   BMI 28.19 kg/m    Objective:   Physical Exam  Constitutional: She appears well-nourished.  HENT:  Right Ear: Tympanic membrane and ear canal normal.  Left Ear: Tympanic membrane and ear canal normal.  Nose: Right sinus exhibits no maxillary sinus tenderness and no frontal sinus tenderness. Left sinus exhibits no maxillary sinus tenderness and no frontal sinus tenderness.  Mouth/Throat: Oropharynx is clear and moist.  Eyes: Conjunctivae are normal.  Neck: Neck supple.  Cardiovascular: Normal rate and regular rhythm.   Pulmonary/Chest: Effort normal. She has wheezes in the left lower field. She has no rales.  Mild wheezing  Abdominal: There is no CVA tenderness.  Lymphadenopathy:    She has no cervical adenopathy.  Skin: Skin is warm and dry.          Assessment & Plan:  URI:  Cough, ear fullness, chest congestion x 2 days. No fevers, no use of albuterol inhaler (no SOB). Exam today with very mild wheezing noted to LLL, otherwise does not appear ill, exam and vitals unremarkable. Suspect viral involvement on top of existing allergies. Treat with Tessalon Pearls, Albuterol PRN, Mucinex. Fluids, rest, follow up PRN.  Sheral Flow, NP

## 2017-01-20 DIAGNOSIS — F5105 Insomnia due to other mental disorder: Secondary | ICD-10-CM | POA: Diagnosis not present

## 2017-01-20 DIAGNOSIS — F319 Bipolar disorder, unspecified: Secondary | ICD-10-CM | POA: Diagnosis not present

## 2017-02-08 DIAGNOSIS — F319 Bipolar disorder, unspecified: Secondary | ICD-10-CM | POA: Diagnosis not present

## 2017-02-08 DIAGNOSIS — F5105 Insomnia due to other mental disorder: Secondary | ICD-10-CM | POA: Diagnosis not present

## 2017-02-24 DIAGNOSIS — M47816 Spondylosis without myelopathy or radiculopathy, lumbar region: Secondary | ICD-10-CM | POA: Diagnosis not present

## 2017-02-24 DIAGNOSIS — G8929 Other chronic pain: Secondary | ICD-10-CM | POA: Diagnosis not present

## 2017-02-24 DIAGNOSIS — M961 Postlaminectomy syndrome, not elsewhere classified: Secondary | ICD-10-CM | POA: Diagnosis not present

## 2017-02-24 DIAGNOSIS — M533 Sacrococcygeal disorders, not elsewhere classified: Secondary | ICD-10-CM | POA: Diagnosis not present

## 2017-02-24 DIAGNOSIS — M25562 Pain in left knee: Secondary | ICD-10-CM | POA: Diagnosis not present

## 2017-02-24 DIAGNOSIS — Z79899 Other long term (current) drug therapy: Secondary | ICD-10-CM | POA: Diagnosis not present

## 2017-02-24 DIAGNOSIS — M4722 Other spondylosis with radiculopathy, cervical region: Secondary | ICD-10-CM | POA: Diagnosis not present

## 2017-02-24 DIAGNOSIS — Z6829 Body mass index (BMI) 29.0-29.9, adult: Secondary | ICD-10-CM | POA: Diagnosis not present

## 2017-02-24 DIAGNOSIS — G894 Chronic pain syndrome: Secondary | ICD-10-CM | POA: Diagnosis not present

## 2017-02-24 DIAGNOSIS — Z885 Allergy status to narcotic agent status: Secondary | ICD-10-CM | POA: Diagnosis not present

## 2017-02-24 DIAGNOSIS — Z7951 Long term (current) use of inhaled steroids: Secondary | ICD-10-CM | POA: Diagnosis not present

## 2017-03-08 DIAGNOSIS — F319 Bipolar disorder, unspecified: Secondary | ICD-10-CM | POA: Diagnosis not present

## 2017-03-08 DIAGNOSIS — F5105 Insomnia due to other mental disorder: Secondary | ICD-10-CM | POA: Diagnosis not present

## 2017-05-21 DIAGNOSIS — M25562 Pain in left knee: Secondary | ICD-10-CM | POA: Diagnosis not present

## 2017-05-21 DIAGNOSIS — M4722 Other spondylosis with radiculopathy, cervical region: Secondary | ICD-10-CM | POA: Diagnosis not present

## 2017-05-21 DIAGNOSIS — G8929 Other chronic pain: Secondary | ICD-10-CM | POA: Diagnosis not present

## 2017-05-21 DIAGNOSIS — M533 Sacrococcygeal disorders, not elsewhere classified: Secondary | ICD-10-CM | POA: Diagnosis not present

## 2017-05-21 DIAGNOSIS — Z6829 Body mass index (BMI) 29.0-29.9, adult: Secondary | ICD-10-CM | POA: Diagnosis not present

## 2017-05-21 DIAGNOSIS — G894 Chronic pain syndrome: Secondary | ICD-10-CM | POA: Diagnosis not present

## 2017-05-21 DIAGNOSIS — M961 Postlaminectomy syndrome, not elsewhere classified: Secondary | ICD-10-CM | POA: Diagnosis not present

## 2017-05-21 DIAGNOSIS — M47816 Spondylosis without myelopathy or radiculopathy, lumbar region: Secondary | ICD-10-CM | POA: Diagnosis not present

## 2017-06-16 ENCOUNTER — Telehealth: Payer: Self-pay | Admitting: Primary Care

## 2017-06-16 NOTE — Telephone Encounter (Signed)
Please notify patient that I was notified that she has no showed several psychiatry visits. Did she find another psychiatrist to refill her medications?

## 2017-06-17 NOTE — Telephone Encounter (Signed)
Spoken to patient. She stated that she only missed one visit and had to re-schedule another visit. Patient stated that she should have the medication refill but she going to check with the pharmacy.

## 2017-06-17 NOTE — Telephone Encounter (Signed)
Noted, please have her reschedule with her psychiatrist for follow up as recommended.

## 2017-06-17 NOTE — Telephone Encounter (Signed)
Message left for patient to return my call.  

## 2017-06-21 NOTE — Telephone Encounter (Signed)
Spoken and notified patient of Kate's comments. Patient verbalized understanding. 

## 2017-07-11 DIAGNOSIS — F5105 Insomnia due to other mental disorder: Secondary | ICD-10-CM | POA: Diagnosis not present

## 2017-07-11 DIAGNOSIS — F319 Bipolar disorder, unspecified: Secondary | ICD-10-CM | POA: Diagnosis not present

## 2017-08-09 DIAGNOSIS — F5105 Insomnia due to other mental disorder: Secondary | ICD-10-CM | POA: Diagnosis not present

## 2017-08-09 DIAGNOSIS — F319 Bipolar disorder, unspecified: Secondary | ICD-10-CM | POA: Diagnosis not present

## 2017-08-26 HISTORY — PX: CATARACT EXTRACTION: SUR2

## 2017-09-10 DIAGNOSIS — Z791 Long term (current) use of non-steroidal anti-inflammatories (NSAID): Secondary | ICD-10-CM | POA: Diagnosis not present

## 2017-09-10 DIAGNOSIS — M4722 Other spondylosis with radiculopathy, cervical region: Secondary | ICD-10-CM | POA: Diagnosis not present

## 2017-09-10 DIAGNOSIS — M533 Sacrococcygeal disorders, not elsewhere classified: Secondary | ICD-10-CM | POA: Diagnosis not present

## 2017-09-10 DIAGNOSIS — G8929 Other chronic pain: Secondary | ICD-10-CM | POA: Diagnosis not present

## 2017-09-10 DIAGNOSIS — M961 Postlaminectomy syndrome, not elsewhere classified: Secondary | ICD-10-CM | POA: Diagnosis not present

## 2017-09-10 DIAGNOSIS — M5412 Radiculopathy, cervical region: Secondary | ICD-10-CM | POA: Diagnosis not present

## 2017-09-10 DIAGNOSIS — G894 Chronic pain syndrome: Secondary | ICD-10-CM | POA: Diagnosis not present

## 2017-09-10 DIAGNOSIS — M25562 Pain in left knee: Secondary | ICD-10-CM | POA: Diagnosis not present

## 2017-09-10 DIAGNOSIS — M158 Other polyosteoarthritis: Secondary | ICD-10-CM | POA: Diagnosis not present

## 2017-09-10 DIAGNOSIS — M47816 Spondylosis without myelopathy or radiculopathy, lumbar region: Secondary | ICD-10-CM | POA: Diagnosis not present

## 2017-10-22 DIAGNOSIS — M533 Sacrococcygeal disorders, not elsewhere classified: Secondary | ICD-10-CM | POA: Diagnosis not present

## 2017-11-25 DIAGNOSIS — H5203 Hypermetropia, bilateral: Secondary | ICD-10-CM | POA: Diagnosis not present

## 2017-11-25 DIAGNOSIS — H43313 Vitreous membranes and strands, bilateral: Secondary | ICD-10-CM | POA: Diagnosis not present

## 2017-11-25 DIAGNOSIS — H43819 Vitreous degeneration, unspecified eye: Secondary | ICD-10-CM | POA: Diagnosis not present

## 2017-12-01 DIAGNOSIS — M961 Postlaminectomy syndrome, not elsewhere classified: Secondary | ICD-10-CM | POA: Diagnosis not present

## 2017-12-01 DIAGNOSIS — G894 Chronic pain syndrome: Secondary | ICD-10-CM | POA: Diagnosis not present

## 2017-12-01 DIAGNOSIS — M25562 Pain in left knee: Secondary | ICD-10-CM | POA: Diagnosis not present

## 2017-12-01 DIAGNOSIS — M533 Sacrococcygeal disorders, not elsewhere classified: Secondary | ICD-10-CM | POA: Diagnosis not present

## 2017-12-01 DIAGNOSIS — G8929 Other chronic pain: Secondary | ICD-10-CM | POA: Diagnosis not present

## 2017-12-01 DIAGNOSIS — M4722 Other spondylosis with radiculopathy, cervical region: Secondary | ICD-10-CM | POA: Diagnosis not present

## 2017-12-01 DIAGNOSIS — M47816 Spondylosis without myelopathy or radiculopathy, lumbar region: Secondary | ICD-10-CM | POA: Diagnosis not present

## 2017-12-07 DIAGNOSIS — F319 Bipolar disorder, unspecified: Secondary | ICD-10-CM | POA: Diagnosis not present

## 2017-12-07 DIAGNOSIS — F5105 Insomnia due to other mental disorder: Secondary | ICD-10-CM | POA: Diagnosis not present

## 2017-12-17 ENCOUNTER — Encounter: Payer: Self-pay | Admitting: Primary Care

## 2017-12-17 ENCOUNTER — Ambulatory Visit: Payer: PPO | Admitting: Primary Care

## 2017-12-17 ENCOUNTER — Ambulatory Visit (INDEPENDENT_AMBULATORY_CARE_PROVIDER_SITE_OTHER): Payer: PPO | Admitting: Primary Care

## 2017-12-17 ENCOUNTER — Ambulatory Visit (INDEPENDENT_AMBULATORY_CARE_PROVIDER_SITE_OTHER)
Admission: RE | Admit: 2017-12-17 | Discharge: 2017-12-17 | Disposition: A | Discharge status: Home / Self Care | Payer: PPO | Source: Ambulatory Visit | Attending: Primary Care | Admitting: Primary Care

## 2017-12-17 ENCOUNTER — Ambulatory Visit: Payer: Self-pay

## 2017-12-17 VITALS — BP 116/70 | HR 75 | Temp 98.0°F | Ht 63.25 in | Wt 166.5 lb

## 2017-12-17 DIAGNOSIS — J3089 Other allergic rhinitis: Secondary | ICD-10-CM | POA: Diagnosis not present

## 2017-12-17 DIAGNOSIS — M25562 Pain in left knee: Secondary | ICD-10-CM

## 2017-12-17 DIAGNOSIS — Z23 Encounter for immunization: Secondary | ICD-10-CM | POA: Diagnosis not present

## 2017-12-17 DIAGNOSIS — M179 Osteoarthritis of knee, unspecified: Secondary | ICD-10-CM | POA: Diagnosis not present

## 2017-12-17 MED ORDER — MOMETASONE FUROATE 50 MCG/ACT NA SUSP: NASAL | 3 refills | Status: DC

## 2017-12-17 MED ORDER — ETODOLAC 500 MG PO TABS: ORAL_TABLET | ORAL | 0 refills | Status: DC

## 2017-12-17 NOTE — Patient Instructions (Signed)
Complete xray(s) prior to leaving today. I will notify you of your results once received.  You will be contacted regarding your referral to orthopedics.  Please let us know if you have not been contacted within one week.   It was a pleasure to see you today!  

## 2017-12-17 NOTE — Progress Notes (Signed)
Subjective:    Patient ID: Sharon Collins, female    DOB: January 16, 1951, 67 y.o.   MRN: [id number]  HPI  Ms. Shiller is a 67 year old female with a history of chronic back pain who presents today with a chief complaint of knee pain. She is also needing medication refills.   1) Knee Pain: She is currently following with pain management through [facility] for chronic back pain, last visit in 2019. She's managed on Percocet, etodolac, and gabapentin. History of torn meniscus to left knee 3-4 years ago, with surgical repair. She's experiencing left lateral knee pain with radiation of pain down her left lower extremity which is constant. She's also feeling symptoms of the knee "giving way". She does better when wearing her orthoticss. Knee pain has been present for the past 1-2 months, worse when being on her knees during the day.   She's tried wearing her knee brace which causes her pain to become worse. She's been rubbing her knee in OTC topical agents and taking her prescription medications without much improvement. She does a lot of PT for her back, stretching at home. She denies injury/trauma, redness. She's noticed mild swelling.   2) Anxiety and Depression: Currently managed on Zoloft 100 mg and Lamictal 100 mg. She is following with psychiatry. Doing well, feels well managed.   3) Allergic Rhinitis: Currently managed on Albuterol inhaler, Nasonex, Singulair 10 mg and Allegra 180 mg. She is needing a refill of her Nasonex.   Review of Systems  HENT: Positive for congestion and postnasal drip.   Musculoskeletal: Positive for arthralgias and joint swelling.       Chronic left knee pain  Skin: Negative for color change.  Allergic/Immunologic: Positive for environmental allergies.  Psychiatric/Behavioral:       Feels well managed       Past Medical History:  Diagnosis Date  . Acute medial meniscus tear    left  . Allergic rhinitis   . Anxiety   . Anxiety and depression   . Asthma   .  Atrophic vaginitis   . Cataracts, bilateral   . Cervical radiculopathy   . Insomnia   . Urinary incontinence      Social History   Socioeconomic History  . Marital status: Married    Spouse name: Not on file  . Number of children: Not on file  . Years of education: Not on file  . Highest education level: Not on file  Social Needs  . Financial resource strain: Not on file  . Food insecurity - worry: Not on file  . Food insecurity - inability: Not on file  . Transportation needs - medical: Not on file  . Transportation needs - non-medical: Not on file  Occupational History  . Not on file  Tobacco Use  . Smoking status: Never Smoker  . Smokeless tobacco: Never Used  Substance and Sexual Activity  . Alcohol use: No  . Drug use: No  . Sexual activity: Not on file  Other Topics Concern  . Not on file  Social History Narrative   Married.   3 children, 11 grandchildren, 1 great grand child.   Retired. Worked at [employer], [place], [place], [place].   Enjoys spending time with her family.    Past Surgical History:  Procedure Laterality Date  . BUNIONECTOMY    . CATARACT EXTRACTION Right 08/2017  . HAMMER TOE SURGERY    . LAMINECTOMY    . MANDIBLE FRACTURE SURGERY    .  MANDIBLE FRACTURE SURGERY    . TOTAL ABDOMINAL HYSTERECTOMY      Family History  Problem Relation Age of Onset  . Stroke Mother   . Hypertension Mother   . Thyroid disease Mother   . Cancer Father        Brain Tumor  . Hypertension Sister   . Thyroid disease Sister   . Hypertension Brother   . Mental illness Brother   . Thyroid disease Brother   . COPD Brother   . Heart disease Brother   . Cancer Maternal Aunt   . Cancer Maternal Grandmother   . Diabetes Neg Hx     Allergies  Allergen Reactions  . Codeine   . Hydrocodone Nausea And Vomiting    Vicodin causes vomiting, but when takes Phenergan 0.25 mg with it, she can tolerate it.  . Tramadol Nausea And Vomiting    Causes vomiting &  affects her heart rhythm.    Current Outpatient Medications on File Prior to Visit  Medication Sig Dispense Refill  . albuterol (PROVENTIL HFA;VENTOLIN HFA) 108 (90 Base) MCG/ACT inhaler Inhale 2 puffs into the lungs every 6 (six) hours as needed for wheezing or shortness of breath. 1 Inhaler 12  . fexofenadine (ALLEGRA) 180 MG tablet Take 180 mg by mouth daily.    Marland Kitchen gabapentin (NEURONTIN) 300 MG capsule Take 600 mg by mouth 3 (three) times daily.     Marland Kitchen lamoTRIgine (LAMICTAL) 100 MG tablet Take 1 tablet (100 mg total) by mouth daily. 90 tablet 0  . montelukast (SINGULAIR) 10 MG tablet Take 1 tablet (10 mg total) by mouth at bedtime. 90 tablet 3  . oxyCODONE-acetaminophen (PERCOCET/ROXICET) 5-325 MG tablet Take by mouth 2 (two) times daily as needed.    . sertraline (ZOLOFT) 100 MG tablet Take 1 tablet (100 mg total) by mouth daily. 90 tablet 0  . ondansetron (ZOFRAN-ODT) 4 MG disintegrating tablet Take 1 tablet (4 mg total) by mouth every 8 (eight) hours as needed for nausea or vomiting. (Patient not taking: Reported on 12/17/2017) 20 tablet 3   No current facility-administered medications on file prior to visit.     BP 116/70   Pulse 75   Temp 98 F (36.7 C) (Oral)   Ht 5' 3.25" (1.607 m)   Wt 166 lb 8 oz (75.5 kg)   LMP  (LMP Unknown)   SpO2 98%   BMI 29.26 kg/m    Objective:   Physical Exam  Constitutional: She appears well-nourished.  Neck: Neck supple.  Cardiovascular: Normal rate and regular rhythm.  Pulmonary/Chest: Effort normal and breath sounds normal.  Musculoskeletal:       Left knee: She exhibits normal range of motion. Tenderness found. No lateral joint line tenderness noted.       Legs: Tenderness to left lateral lower extremity distal to left lateral ligaments of knee. 5/5 strength to bilateral lower extremities.   Neurological:  Reflex Scores:      Patellar reflexes are 2+ on the right side and 2+ on the left side. Skin: Skin is warm and dry. No erythema.    Psychiatric: She has a normal mood and affect.          Assessment & Plan:  Acute Knee Pain:  Located to left knee x 1-2 months, no improvement.  Exam today without deformity, weakness, obvious abnormality. Mld swelling to left knee. Plain films of left knee pending.  She declines PT as she's already quite active with PT for her back and  completes home stretching exercises. Referral placed to orthopedics per her request. Continue Rx medications.  [name], NP

## 2017-12-17 NOTE — Addendum Note (Signed)
Addended by: Jacqualin Combes on: 12/17/2017 01:03 PM   Modules accepted: Orders

## 2017-12-17 NOTE — Telephone Encounter (Signed)
Results in result note, left VM for patient to call back for results.

## 2017-12-20 ENCOUNTER — Ambulatory Visit: Payer: Self-pay | Admitting: *Deleted

## 2017-12-20 NOTE — Telephone Encounter (Signed)
Please schedule an office visit, would like to examine her. We can work her in however, I just need to see her before we can treat.

## 2017-12-20 NOTE — Telephone Encounter (Signed)
Recommend she continue her current allergy treatment and come see [place] is sinus pressure has lasted longer than 10 days, is running fevers of 101 or greater, has been blowing thick/green mucous from nasal cavity for more than one week.

## 2017-12-20 NOTE — Telephone Encounter (Signed)
Pt  Denies    Any  sorethroat     She  Reports   Sinus  Drainage  Down  Back  Of  Throat      And    Some  Congestion    She  Denies  A  Cough .   She  Has  A  History   Of  Sinus problems  In  Past .  Attempted  To make an  Appointment for  The  Patient but  She  Refused.  Pt  Was  Also advised  Of  Her  Knee   X  Ray  Results  Last  Week    Done  Last  Week.   Pt declined  Appointment  And  Wishes  An  Anti biotic  To  Be  Called  In . Pt  Was  Seen   Last  Week  By PCP  For  Knee  Problems  -  But these  Current  Symptoms  She  States  Were not  Prominent  And  No  meds   Were RX  She  Stated   Pt Call back  Number  Is   [phone]      Reason for Disposition . [1] Sinus congestion (pressure, fullness) AND [2] present > 10 days  Answer Assessment - Initial Assessment Questions 1. LOCATION: "Where does it hurt?"        No  Pain   2. ONSET: "When did the sinus pain start?"  (e.g., hours, days)     pain 3. SEVERITY: "How bad is the pain?"   (Scale 1-10; mild, moderate or severe)   - MILD (1-3): doesn't interfere with normal activities    - MODERATE (4-7): interferes with normal activities (e.g., work or school) or awakens from sleep   - SEVERE (8-10): excruciating pain and patient unable to do any normal activities        No pain 4. RECURRENT SYMPTOM: "Have you ever had sinus problems before?" If so, ask: "When was the last time?" and "What happened that time?"      Yes     3  Months   Ago    -    Pt    Used   Singulair   And  Allegra   5. NASAL CONGESTION: "Is the nose blocked?" If so, ask, "Can you open it or must you breathe through the mouth?"       No   6. NASAL DISCHARGE: "Do you have discharge from your nose?" If so ask, "What color?"     No 7. FEVER: "Do you have a fever?" If so, ask: "What is it, how was it measured, and when did it start?"      No  fever 8. OTHER SYMPTOMS: "Do you have any other symptoms?" (e.g., sore throat, cough, earache, difficulty breathing)      Post   Nasal  Drainage    Causing  Throat  Congestion   9. PREGNANCY: "Is there any chance you are pregnant?" "When was your last menstrual period?"     n/a  Protocols used: SINUS PAIN OR CONGESTION-A-AH

## 2017-12-20 NOTE — Telephone Encounter (Signed)
Patient stated that she is going to have to let it go. Patient is tending to her mother in law and unable to schedule appointment.

## 2017-12-20 NOTE — Telephone Encounter (Signed)
Patient stated that she is upset of the triage call this morning. She felt like she had a run a round.  Patient stated that she has been having green thick mucus after she has seen Anda Kraft on  12/17/2017. Very asthmatic and very awful. She is currently in the ED with her mother who is being seen.  Please advise.

## 2018-01-06 ENCOUNTER — Other Ambulatory Visit: Payer: Self-pay | Admitting: Primary Care

## 2018-01-06 DIAGNOSIS — Z0000 Encounter for general adult medical examination without abnormal findings: Secondary | ICD-10-CM

## 2018-01-11 ENCOUNTER — Other Ambulatory Visit (INDEPENDENT_AMBULATORY_CARE_PROVIDER_SITE_OTHER): Payer: PPO

## 2018-01-11 DIAGNOSIS — Z0000 Encounter for general adult medical examination without abnormal findings: Secondary | ICD-10-CM | POA: Diagnosis not present

## 2018-01-11 LAB — COMPREHENSIVE METABOLIC PANEL
ALT: 10 U/L (ref 0–35)
AST: 15 U/L (ref 0–37)
Albumin: 4.6 g/dL (ref 3.5–5.2)
Alkaline Phosphatase: 68 U/L (ref 39–117)
BUN: 10 mg/dL (ref 6–23)
CHLORIDE: 101 meq/L (ref 96–112)
CO2: 30 meq/L (ref 19–32)
Calcium: 9.8 mg/dL (ref 8.4–10.5)
Creatinine, Ser: 0.92 mg/dL (ref 0.40–1.20)
GFR: 64.84 mL/min (ref >60.00)
GLUCOSE: 83 mg/dL (ref 70–99)
POTASSIUM: 4.3 meq/L (ref 3.5–5.1)
SODIUM: 138 meq/L (ref 135–145)
TOTAL PROTEIN: 7.1 g/dL (ref 6.0–8.3)
Total Bilirubin: 0.5 mg/dL (ref 0.2–1.2)

## 2018-01-11 LAB — LIPID PANEL
CHOL/HDL RATIO: 3
Cholesterol: 196 mg/dL (ref 0–200)
HDL: 64.2 mg/dL (ref >39.00)
LDL CALC: 99 mg/dL (ref 0–99)
NONHDL: 132.08
Triglycerides: 167 mg/dL — ABNORMAL HIGH (ref 0.0–149.0)
VLDL: 33.4 mg/dL (ref 0.0–40.0)

## 2018-01-12 DIAGNOSIS — M1712 Unilateral primary osteoarthritis, left knee: Secondary | ICD-10-CM | POA: Diagnosis not present

## 2018-01-12 DIAGNOSIS — M25562 Pain in left knee: Secondary | ICD-10-CM | POA: Diagnosis not present

## 2018-01-18 ENCOUNTER — Ambulatory Visit (INDEPENDENT_AMBULATORY_CARE_PROVIDER_SITE_OTHER): Payer: PPO | Admitting: Primary Care

## 2018-01-18 ENCOUNTER — Encounter: Payer: Self-pay | Admitting: Primary Care

## 2018-01-18 ENCOUNTER — Other Ambulatory Visit (HOSPITAL_COMMUNITY)
Admission: RE | Admit: 2018-01-18 | Discharge: 2018-01-18 | Disposition: A | Discharge status: Home / Self Care | Payer: PPO | Source: Ambulatory Visit | Attending: Primary Care | Admitting: Primary Care

## 2018-01-18 VITALS — BP 112/76 | HR 67 | Temp 98.0°F | Ht 63.25 in | Wt 160.0 lb

## 2018-01-18 DIAGNOSIS — M549 Dorsalgia, unspecified: Secondary | ICD-10-CM | POA: Diagnosis not present

## 2018-01-18 DIAGNOSIS — F329 Major depressive disorder, single episode, unspecified: Secondary | ICD-10-CM

## 2018-01-18 DIAGNOSIS — Z01419 Encounter for gynecological examination (general) (routine) without abnormal findings: Secondary | ICD-10-CM | POA: Diagnosis not present

## 2018-01-18 DIAGNOSIS — M25562 Pain in left knee: Secondary | ICD-10-CM | POA: Diagnosis not present

## 2018-01-18 DIAGNOSIS — F32A Depression, unspecified: Secondary | ICD-10-CM

## 2018-01-18 DIAGNOSIS — E2839 Other primary ovarian failure: Secondary | ICD-10-CM | POA: Diagnosis not present

## 2018-01-18 DIAGNOSIS — Z1231 Encounter for screening mammogram for malignant neoplasm of breast: Secondary | ICD-10-CM

## 2018-01-18 DIAGNOSIS — J3089 Other allergic rhinitis: Secondary | ICD-10-CM | POA: Diagnosis not present

## 2018-01-18 DIAGNOSIS — F419 Anxiety disorder, unspecified: Secondary | ICD-10-CM

## 2018-01-18 DIAGNOSIS — Z124 Encounter for screening for malignant neoplasm of cervix: Secondary | ICD-10-CM | POA: Insufficient documentation

## 2018-01-18 DIAGNOSIS — Z0000 Encounter for general adult medical examination without abnormal findings: Secondary | ICD-10-CM | POA: Diagnosis not present

## 2018-01-18 DIAGNOSIS — G8929 Other chronic pain: Secondary | ICD-10-CM

## 2018-01-18 DIAGNOSIS — Z1239 Encounter for other screening for malignant neoplasm of breast: Secondary | ICD-10-CM

## 2018-01-18 MED ORDER — MOMETASONE FUROATE 50 MCG/ACT NA SUSP: NASAL | 3 refills | Status: DC

## 2018-01-18 MED ORDER — MONTELUKAST SODIUM 10 MG PO TABS: 10.0000 mg | ORAL_TABLET | Freq: Every day | ORAL | 3 refills | Status: DC

## 2018-01-18 MED ORDER — ETODOLAC 500 MG PO TABS
ORAL_TABLET | ORAL | 0 refills | Status: DC
Start: 2018-01-18 — End: 2018-05-14

## 2018-01-18 NOTE — Progress Notes (Signed)
Subjective:    Patient ID: Sharon Collins, female    DOB: 20-Jan-1951, 67 y.o.   MRN: [id number]  HPI  Sharon Collins is a 67 year old female who presents today for complete physical.  Immunizations: -Tetanus: Completed in 2010 -Influenza: Completed this season -Pneumonia: Completed Prevnar in 2017 -Shingles: Completed Zostavax in 2015  Diet: She endorses a healthy diet. Breakfast: Yogurt, bacon, eggs Lunch: Salad, vegetables, some fast food Dinner: Meat, casseroles, vegetables, starch Snacks: Chips Desserts: Occasionally, ice cream sandwich, ice cream, cakes (3-5 nights weekly, sometimes less). Beverages: Hot tea, sweet tea, soda, water, occasional wine  Exercise: She is not exercising. She does stretch sometimes.  Eye exam: Completed in February 2019 Dental exam: Completes annually  Colonoscopy: Completed in 2014, due in 2024 Dexa: Due Mammogram: Completed years ago.  Hep C Screen: Negative in 2017   Review of Systems  Constitutional: Negative for unexpected weight change.  HENT: Negative for rhinorrhea.   Respiratory: Negative for cough and shortness of breath.   Cardiovascular: Negative for chest pain.  Gastrointestinal: Negative for constipation and diarrhea.  Genitourinary: Negative for difficulty urinating and menstrual problem.  Musculoskeletal: Negative for arthralgias and myalgias.  Skin: Negative for rash.  Allergic/Immunologic: Negative for environmental allergies.  Neurological: Negative for dizziness, numbness and headaches.  Psychiatric/Behavioral:       Follows with psychiatry, feels well managed.       Past Medical History:  Diagnosis Date  . Acute medial meniscus tear    left  . Allergic rhinitis   . Anxiety   . Anxiety and depression   . Asthma   . Atrophic vaginitis   . Cataracts, bilateral   . Cervical radiculopathy   . Insomnia   . Urinary incontinence      Social History   Socioeconomic History  . Marital status: Married   Spouse name: Not on file  . Number of children: Not on file  . Years of education: Not on file  . Highest education level: Not on file  Occupational History  . Not on file  Social Needs  . Financial resource strain: Not on file  . Food insecurity:    Worry: Not on file    Inability: Not on file  . Transportation needs:    Medical: Not on file    Non-medical: Not on file  Tobacco Use  . Smoking status: Never Smoker  . Smokeless tobacco: Never Used  Substance and Sexual Activity  . Alcohol use: No  . Drug use: No  . Sexual activity: Not on file  Lifestyle  . Physical activity:    Days per week: Not on file    Minutes per session: Not on file  . Stress: Not on file  Relationships  . Social connections:    Talks on phone: Not on file    Gets together: Not on file    Attends religious service: Not on file    Active member of club or organization: Not on file    Attends meetings of clubs or organizations: Not on file    Relationship status: Not on file  . Intimate partner violence:    Fear of current or ex partner: Not on file    Emotionally abused: Not on file    Physically abused: Not on file    Forced sexual activity: Not on file  Other Topics Concern  . Not on file  Social History Narrative   Married.   3 children, 11 grandchildren, 1 great  grand child.   Retired. Worked at [employer], [place], [place], [place].   Enjoys spending time with her family.    Past Surgical History:  Procedure Laterality Date  . BUNIONECTOMY    . CATARACT EXTRACTION Right 08/2017  . HAMMER TOE SURGERY    . LAMINECTOMY    . MANDIBLE FRACTURE SURGERY    . MANDIBLE FRACTURE SURGERY    . TOTAL ABDOMINAL HYSTERECTOMY      Family History  Problem Relation Age of Onset  . Stroke Mother   . Hypertension Mother   . Thyroid disease Mother   . Cancer Father        Brain Tumor  . Hypertension Sister   . Thyroid disease Sister   . Hypertension Brother   . Mental illness Brother   .  Thyroid disease Brother   . COPD Brother   . Heart disease Brother   . Cancer Maternal Aunt   . Cancer Maternal Grandmother   . Diabetes Neg Hx     Allergies  Allergen Reactions  . Codeine   . Hydrocodone Nausea And Vomiting    Vicodin causes vomiting, but when takes Phenergan 0.25 mg with it, she can tolerate it.  . Tramadol Nausea And Vomiting    Causes vomiting & affects her heart rhythm.    Current Outpatient Medications on File Prior to Visit  Medication Sig Dispense Refill  . albuterol (PROVENTIL HFA;VENTOLIN HFA) 108 (90 Base) MCG/ACT inhaler Inhale 2 puffs into the lungs every 6 (six) hours as needed for wheezing or shortness of breath. 1 Inhaler 12  . fexofenadine (ALLEGRA) 180 MG tablet Take 180 mg by mouth daily.    Marland Kitchen gabapentin (NEURONTIN) 300 MG capsule Take 600 mg by mouth 3 (three) times daily.     Marland Kitchen lamoTRIgine (LAMICTAL) 100 MG tablet Take 1 tablet (100 mg total) by mouth daily. 90 tablet 0  . oxyCODONE-acetaminophen (PERCOCET/ROXICET) 5-325 MG tablet Take by mouth 2 (two) times daily as needed.    . sertraline (ZOLOFT) 100 MG tablet Take 1 tablet (100 mg total) by mouth daily. 90 tablet 0  . ondansetron (ZOFRAN-ODT) 4 MG disintegrating tablet Take 1 tablet (4 mg total) by mouth every 8 (eight) hours as needed for nausea or vomiting. (Patient not taking: Reported on 01/18/2018) 20 tablet 3   No current facility-administered medications on file prior to visit.     BP 112/76   Pulse 67   Temp 98 F (36.7 C) (Oral)   Ht 5' 3.25" (1.607 m)   Wt 160 lb (72.6 kg)   LMP  (LMP Unknown)   SpO2 97%   BMI 28.12 kg/m    Objective:   Physical Exam  Constitutional: She is oriented to person, place, and time. She appears well-nourished.  HENT:  Right Ear: Tympanic membrane and ear canal normal.  Left Ear: Tympanic membrane and ear canal normal.  Nose: Nose normal.  Mouth/Throat: Oropharynx is clear and moist.  Eyes: Pupils are equal, round, and reactive to light.  Conjunctivae and EOM are normal.  Neck: Neck supple. No thyromegaly present.  Cardiovascular: Normal rate and regular rhythm.  No murmur heard. Pulmonary/Chest: Effort normal and breath sounds normal. She has no rales.  Abdominal: Soft. Bowel sounds are normal. There is no tenderness.  Genitourinary: There is no tenderness or lesion on the right labia. There is no tenderness or lesion on the left labia. Cervix exhibits no motion tenderness and no discharge. No erythema in the vagina. No vaginal  discharge found.  Musculoskeletal: Normal range of motion.  Lymphadenopathy:    She has no cervical adenopathy.  Neurological: She is alert and oriented to person, place, and time. She has normal reflexes. No cranial nerve deficit.  Skin: Skin is warm and dry. No rash noted.  Psychiatric: She has a normal mood and affect.          Assessment & Plan:

## 2018-01-18 NOTE — Assessment & Plan Note (Addendum)
Mammogram and bone density due, ordered and pending. Patient prefers pap smear given history of benign fibroid tumors, discussed that insurance may not cover given her age. She'd like to proceed. Colonoscopy UTD, due in 2024.  Discussed the importance of a healthy diet and regular exercise in order for weight loss, and to reduce the risk of any potential medical problems. Exam unremarkable. Labs stable. Follow up in 1 year.

## 2018-01-18 NOTE — Assessment & Plan Note (Addendum)
Currently following with pain management through [facility] Continue Percocet for which she takes infrequently, gabapentin. Refill sent for PRN etodolac for which she also takes infrequently.

## 2018-01-18 NOTE — Patient Instructions (Addendum)
Start exercising. You should be getting 150 minutes of moderate intensity exercise weekly.  Increase consumption of vegetables, fruit, whole grains, lean protein.  Ensure you are consuming 64 ounces of water daily.  Call the [facility] to schedule your mammogram and bone density tests.  We will notify you of your pap smear results once received.  Follow up in 1 year for your annual exam or sooner if needed.  It was a pleasure to see you today!   Preventive Care 67 Years and Older, Female Preventive care refers to lifestyle choices and visits with your health care provider that can promote health and wellness. What does preventive care include?  A yearly physical exam. This is also called an annual well check.  Dental exams once or twice a year.  Routine eye exams. Ask your health care provider how often you should have your eyes checked.  Personal lifestyle choices, including: ? Daily care of your teeth and gums. ? Regular physical activity. ? Eating a healthy diet. ? Avoiding tobacco and drug use. ? Limiting alcohol use. ? Practicing safe sex. ? Taking low-dose aspirin every day. ? Taking vitamin and mineral supplements as recommended by your health care provider. What happens during an annual well check? The services and screenings done by your health care provider during your annual well check will depend on your age, overall health, lifestyle risk factors, and family history of disease. Counseling Your health care provider may ask you questions about your:  Alcohol use.  Tobacco use.  Drug use.  Emotional well-being.  Home and relationship well-being.  Sexual activity.  Eating habits.  History of falls.  Memory and ability to understand (cognition).  Work and work Statistician.  Reproductive health.  Screening You may have the following tests or measurements:  Height, weight, and BMI.  Blood pressure.  Lipid and cholesterol levels.  These may be checked every 5 years, or more frequently if you are over 10 years old.  Skin check.  Lung cancer screening. You may have this screening every year starting at age 67 if you have a 30-pack-year history of smoking and currently smoke or have quit within the past 15 years.  Fecal occult blood test (FOBT) of the stool. You may have this test every year starting at age 67.  Flexible sigmoidoscopy or colonoscopy. You may have a sigmoidoscopy every 5 years or a colonoscopy every 10 years starting at age 67.  Hepatitis C blood test.  Hepatitis B blood test.  Sexually transmitted disease (STD) testing.  Diabetes screening. This is done by checking your blood sugar (glucose) after you have not eaten for a while (fasting). You may have this done every 1-3 years.  Bone density scan. This is done to screen for osteoporosis. You may have this done starting at age 67.  Mammogram. This may be done every 1-2 years. Talk to your health care provider about how often you should have regular mammograms.  Talk with your health care provider about your test results, treatment options, and if necessary, the need for more tests. Vaccines Your health care provider may recommend certain vaccines, such as:  Influenza vaccine. This is recommended every year.  Tetanus, diphtheria, and acellular pertussis (Tdap, Td) vaccine. You may need a Td booster every 10 years.  Varicella vaccine. You may need this if you have not been vaccinated.  Zoster vaccine. You may need this after age 20.  Measles, mumps, and rubella (MMR) vaccine. You may need at least  one dose of MMR if you were born in 1957 or later. You may also need a second dose.  Pneumococcal 13-valent conjugate (PCV13) vaccine. One dose is recommended after age 67.  Pneumococcal polysaccharide (PPSV23) vaccine. One dose is recommended after age 67.  Meningococcal vaccine. You may need this if you have certain conditions.  Hepatitis A  vaccine. You may need this if you have certain conditions or if you travel or work in places where you may be exposed to hepatitis A.  Hepatitis B vaccine. You may need this if you have certain conditions or if you travel or work in places where you may be exposed to hepatitis B.  Haemophilus influenzae type b (Hib) vaccine. You may need this if you have certain conditions.  Talk to your health care provider about which screenings and vaccines you need and how often you need them. This information is not intended to replace advice given to you by your health care provider. Make sure you discuss any questions you have with your health care provider. Document Released: 11/08/2015 Document Revised: 07/01/2016 Document Reviewed: 08/13/2015 Elsevier Interactive Patient Education  [employer].

## 2018-01-18 NOTE — Assessment & Plan Note (Signed)
Currently following with psychiatry. Doing well on Zoloft and Lamictal. Continue current regimen.

## 2018-01-18 NOTE — Assessment & Plan Note (Addendum)
Stable on Allegra, Singulair, and Nasonex. Continue same. Patient requesting to see Laren Boom through Allergy clinic, referral placed.

## 2018-01-19 ENCOUNTER — Encounter: Payer: Self-pay | Admitting: Primary Care

## 2018-01-21 LAB — CYTOLOGY - PAP: Diagnosis: NEGATIVE

## 2018-01-26 ENCOUNTER — Telehealth: Payer: Self-pay

## 2018-01-26 NOTE — Telephone Encounter (Signed)
Okay to move forward with regular mammogram.

## 2018-01-26 NOTE — Telephone Encounter (Signed)
Gave the verbal okay to the regular mammogram to [facility]

## 2018-01-26 NOTE — Telephone Encounter (Signed)
Copied from [place]. Topic: Referral - Question >> Jan 26, 2018 10:46 AM Scherrie Gerlach wrote: Feliciana Rossetti with [facility] calling to ask if ok to do a regular mammogram because they do not do 3 D. They way the order was put in was for a 3D. Feliciana Rossetti says ok to just call back, and if she is not there, Jackelyn Poling is there to take a verbal.

## 2018-02-16 DIAGNOSIS — M238X2 Other internal derangements of left knee: Secondary | ICD-10-CM | POA: Diagnosis not present

## 2018-02-16 DIAGNOSIS — M1712 Unilateral primary osteoarthritis, left knee: Secondary | ICD-10-CM | POA: Diagnosis not present

## 2018-02-16 DIAGNOSIS — M25562 Pain in left knee: Secondary | ICD-10-CM | POA: Diagnosis not present

## 2018-02-23 DIAGNOSIS — M1712 Unilateral primary osteoarthritis, left knee: Secondary | ICD-10-CM | POA: Diagnosis not present

## 2018-03-08 DIAGNOSIS — J3089 Other allergic rhinitis: Secondary | ICD-10-CM | POA: Diagnosis not present

## 2018-03-08 DIAGNOSIS — J3081 Allergic rhinitis due to animal (cat) (dog) hair and dander: Secondary | ICD-10-CM | POA: Diagnosis not present

## 2018-03-08 DIAGNOSIS — J301 Allergic rhinitis due to pollen: Secondary | ICD-10-CM | POA: Diagnosis not present

## 2018-03-08 DIAGNOSIS — R05 Cough: Secondary | ICD-10-CM | POA: Diagnosis not present

## 2018-03-11 DIAGNOSIS — M25562 Pain in left knee: Secondary | ICD-10-CM | POA: Diagnosis not present

## 2018-03-11 DIAGNOSIS — S83242A Other tear of medial meniscus, current injury, left knee, initial encounter: Secondary | ICD-10-CM | POA: Diagnosis not present

## 2018-03-25 ENCOUNTER — Telehealth: Payer: Self-pay | Admitting: Primary Care

## 2018-03-25 NOTE — Telephone Encounter (Signed)
If we need to cancel that's fine, she can always call back when ready. Let me know what we need to do.

## 2018-03-25 NOTE — Telephone Encounter (Signed)
FYI Pt's bone density and mammogram order was faxed to [facility] 01/26/18 per pt request.  I spoke with Jackelyn Poling # [facility] they have left 3 message asking pt to call the office to schedule appointment I have left 2 message asking pt to call the office here so I can get this schedule for her no response.  Do you want order canceled?

## 2018-04-04 DIAGNOSIS — F319 Bipolar disorder, unspecified: Secondary | ICD-10-CM | POA: Diagnosis not present

## 2018-04-04 DIAGNOSIS — F5105 Insomnia due to other mental disorder: Secondary | ICD-10-CM | POA: Diagnosis not present

## 2018-04-20 DIAGNOSIS — G894 Chronic pain syndrome: Secondary | ICD-10-CM | POA: Diagnosis not present

## 2018-04-20 DIAGNOSIS — M533 Sacrococcygeal disorders, not elsewhere classified: Secondary | ICD-10-CM | POA: Diagnosis not present

## 2018-04-20 DIAGNOSIS — M47816 Spondylosis without myelopathy or radiculopathy, lumbar region: Secondary | ICD-10-CM | POA: Diagnosis not present

## 2018-04-20 DIAGNOSIS — Z6830 Body mass index (BMI) 30.0-30.9, adult: Secondary | ICD-10-CM | POA: Diagnosis not present

## 2018-04-20 DIAGNOSIS — Z79891 Long term (current) use of opiate analgesic: Secondary | ICD-10-CM | POA: Diagnosis not present

## 2018-04-20 DIAGNOSIS — M25562 Pain in left knee: Secondary | ICD-10-CM | POA: Diagnosis not present

## 2018-04-20 DIAGNOSIS — G8929 Other chronic pain: Secondary | ICD-10-CM | POA: Diagnosis not present

## 2018-04-20 DIAGNOSIS — M4722 Other spondylosis with radiculopathy, cervical region: Secondary | ICD-10-CM | POA: Diagnosis not present

## 2018-04-20 DIAGNOSIS — M961 Postlaminectomy syndrome, not elsewhere classified: Secondary | ICD-10-CM | POA: Diagnosis not present

## 2018-05-14 ENCOUNTER — Other Ambulatory Visit: Payer: Self-pay | Admitting: Primary Care

## 2018-05-14 DIAGNOSIS — M25562 Pain in left knee: Secondary | ICD-10-CM

## 2018-05-23 DIAGNOSIS — M6752 Plica syndrome, left knee: Secondary | ICD-10-CM | POA: Diagnosis not present

## 2018-05-23 DIAGNOSIS — M23222 Derangement of posterior horn of medial meniscus due to old tear or injury, left knee: Secondary | ICD-10-CM | POA: Diagnosis not present

## 2018-05-23 DIAGNOSIS — G8918 Other acute postprocedural pain: Secondary | ICD-10-CM | POA: Diagnosis not present

## 2018-05-23 DIAGNOSIS — M94262 Chondromalacia, left knee: Secondary | ICD-10-CM | POA: Diagnosis not present

## 2018-05-23 DIAGNOSIS — M23322 Other meniscus derangements, posterior horn of medial meniscus, left knee: Secondary | ICD-10-CM | POA: Diagnosis not present

## 2018-07-06 DIAGNOSIS — M25562 Pain in left knee: Secondary | ICD-10-CM | POA: Diagnosis not present

## 2018-07-06 DIAGNOSIS — M1712 Unilateral primary osteoarthritis, left knee: Secondary | ICD-10-CM | POA: Diagnosis not present

## 2018-07-06 DIAGNOSIS — Z4889 Encounter for other specified surgical aftercare: Secondary | ICD-10-CM | POA: Diagnosis not present

## 2018-07-19 DIAGNOSIS — M4722 Other spondylosis with radiculopathy, cervical region: Secondary | ICD-10-CM | POA: Diagnosis not present

## 2018-07-19 DIAGNOSIS — G8929 Other chronic pain: Secondary | ICD-10-CM | POA: Diagnosis not present

## 2018-07-19 DIAGNOSIS — M47816 Spondylosis without myelopathy or radiculopathy, lumbar region: Secondary | ICD-10-CM | POA: Diagnosis not present

## 2018-07-19 DIAGNOSIS — M533 Sacrococcygeal disorders, not elsewhere classified: Secondary | ICD-10-CM | POA: Diagnosis not present

## 2018-07-19 DIAGNOSIS — G894 Chronic pain syndrome: Secondary | ICD-10-CM | POA: Diagnosis not present

## 2018-07-19 DIAGNOSIS — M961 Postlaminectomy syndrome, not elsewhere classified: Secondary | ICD-10-CM | POA: Diagnosis not present

## 2018-07-19 DIAGNOSIS — M25562 Pain in left knee: Secondary | ICD-10-CM | POA: Diagnosis not present

## 2018-07-20 DIAGNOSIS — F5105 Insomnia due to other mental disorder: Secondary | ICD-10-CM | POA: Diagnosis not present

## 2018-07-20 DIAGNOSIS — F319 Bipolar disorder, unspecified: Secondary | ICD-10-CM | POA: Diagnosis not present

## 2018-07-21 DIAGNOSIS — H353130 Nonexudative age-related macular degeneration, bilateral, stage unspecified: Secondary | ICD-10-CM | POA: Diagnosis not present

## 2018-07-22 ENCOUNTER — Ambulatory Visit (INDEPENDENT_AMBULATORY_CARE_PROVIDER_SITE_OTHER): Payer: PPO | Admitting: Primary Care

## 2018-07-22 VITALS — BP 116/72 | HR 80 | Temp 98.2°F | Ht 63.25 in | Wt 163.8 lb

## 2018-07-22 DIAGNOSIS — K1379 Other lesions of oral mucosa: Secondary | ICD-10-CM | POA: Diagnosis not present

## 2018-07-22 DIAGNOSIS — R109 Unspecified abdominal pain: Secondary | ICD-10-CM

## 2018-07-22 DIAGNOSIS — M25562 Pain in left knee: Secondary | ICD-10-CM | POA: Diagnosis not present

## 2018-07-22 DIAGNOSIS — J3089 Other allergic rhinitis: Secondary | ICD-10-CM | POA: Diagnosis not present

## 2018-07-22 LAB — POC URINALSYSI DIPSTICK (AUTOMATED)
BILIRUBIN UA: NEGATIVE
Blood, UA: NEGATIVE
GLUCOSE UA: NEGATIVE
KETONES UA: NEGATIVE
Nitrite, UA: NEGATIVE
Protein, UA: POSITIVE — AB
Spec Grav, UA: >=1.03 — AB (ref 1.010–1.025)
Urobilinogen, UA: 0.2 E.U./dL
pH, UA: 6 (ref 5.0–8.0)

## 2018-07-22 LAB — CBC WITH DIFFERENTIAL/PLATELET
BASOS PCT: 0.7 % (ref 0.0–3.0)
Basophils Absolute: 0.1 10*3/uL (ref 0.0–0.1)
EOS PCT: 2.2 % (ref 0.0–5.0)
Eosinophils Absolute: 0.2 10*3/uL (ref 0.0–0.7)
HCT: 37.8 % (ref 36.0–46.0)
HEMOGLOBIN: 12.3 g/dL (ref 12.0–15.0)
Lymphocytes Relative: 19.6 % (ref 12.0–46.0)
Lymphs Abs: 1.7 10*3/uL (ref 0.7–4.0)
MCHC: 32.6 g/dL (ref 30.0–36.0)
MCV: 81.4 fl (ref 78.0–100.0)
Monocytes Absolute: 0.8 10*3/uL (ref 0.1–1.0)
Monocytes Relative: 9.1 % (ref 3.0–12.0)
NEUTROS ABS: 6.1 10*3/uL (ref 1.4–7.7)
Neutrophils Relative %: 68.4 % (ref 43.0–77.0)
PLATELETS: 336 10*3/uL (ref 150.0–400.0)
RBC: 4.64 Mil/uL (ref 3.87–5.11)
RDW: 14.4 % (ref 11.5–15.5)
WBC: 8.9 10*3/uL (ref 4.0–10.5)

## 2018-07-22 LAB — BASIC METABOLIC PANEL
BUN: 10 mg/dL (ref 6–23)
CALCIUM: 9.1 mg/dL (ref 8.4–10.5)
CO2: 32 mEq/L (ref 19–32)
Chloride: 101 mEq/L (ref 96–112)
Creatinine, Ser: 0.91 mg/dL (ref 0.40–1.20)
GFR: 65.56 mL/min (ref >60.00)
GLUCOSE: 82 mg/dL (ref 70–99)
POTASSIUM: 4.3 meq/L (ref 3.5–5.1)
SODIUM: 138 meq/L (ref 135–145)

## 2018-07-22 MED ORDER — ETODOLAC 500 MG PO TABS: ORAL_TABLET | ORAL | 0 refills | Status: DC

## 2018-07-22 MED ORDER — MAGIC MOUTHWASH W/LIDOCAINE: 10.0000 mL | Freq: Three times a day (TID) | ORAL | 0 refills | Status: DC | PRN

## 2018-07-22 MED ORDER — MONTELUKAST SODIUM 10 MG PO TABS: 10.0000 mg | ORAL_TABLET | Freq: Every day | ORAL | 3 refills | Status: DC

## 2018-07-22 MED ORDER — SULFAMETHOXAZOLE-TRIMETHOPRIM 800-160 MG PO TABS: 1.0000 | ORAL_TABLET | Freq: Two times a day (BID) | ORAL | 0 refills | Status: DC

## 2018-07-22 NOTE — Assessment & Plan Note (Signed)
Refill provided of Singulair. Also provided refill of Magic Mouth wash for which she uses for oral pain associated to post nasal drip from allergies.

## 2018-07-22 NOTE — Progress Notes (Signed)
Subjective:    Patient ID: Sharon Collins, female    DOB: 1950-11-24, 67 y.o.   MRN: [id number]  HPI  Sharon Collins is a 67 year old female with a history of allergic rhinitis, urinary incontinence, and chronic back pain who presents today with a chief complaint of flank pain. She is also requesting refills of etodolac, magic mouthwash, and montelukast 10 mg.   She also reports bilateral lower back pain that feels different from her typical pain, left lower quadrant abdominal pain, low grade fevers intermittently of 99, urinary frequency, diarrhea.  Her diarrhea is intermittent and has been chronic since being on Lamictal. Her symptoms began one month ago and have been intermittent. She's been helping to take care of her sister in law who had terminal cancer. she has since passed away.   She denies hematuria, dysuria, nausea, vomiting, decrease in appetite, bloody stools.   She uses the magic mouthwash for throat irritation and oral pain due to post nasal drip secondary to allergies.   Review of Systems  Constitutional: Positive for fatigue and fever. Negative for appetite change.  Respiratory: Negative for cough and shortness of breath.   Cardiovascular: Negative for chest pain.  Gastrointestinal: Positive for abdominal pain. Negative for nausea and vomiting.  Genitourinary: Positive for frequency. Negative for difficulty urinating, dysuria, hematuria, urgency and vaginal discharge.       Past Medical History:  Diagnosis Date  . Acute medial meniscus tear    left  . Allergic rhinitis   . Anxiety   . Anxiety and depression   . Asthma   . Atrophic vaginitis   . Cataracts, bilateral   . Cervical radiculopathy   . Insomnia   . Urinary incontinence      Social History   Socioeconomic History  . Marital status: Married    Spouse name: Not on file  . Number of children: Not on file  . Years of education: Not on file  . Highest education level: Not on file  Occupational  History  . Not on file  Social Needs  . Financial resource strain: Not on file  . Food insecurity:    Worry: Not on file    Inability: Not on file  . Transportation needs:    Medical: Not on file    Non-medical: Not on file  Tobacco Use  . Smoking status: Never Smoker  . Smokeless tobacco: Never Used  Substance and Sexual Activity  . Alcohol use: No  . Drug use: No  . Sexual activity: Not on file  Lifestyle  . Physical activity:    Days per week: Not on file    Minutes per session: Not on file  . Stress: Not on file  Relationships  . Social connections:    Talks on phone: Not on file    Gets together: Not on file    Attends religious service: Not on file    Active member of club or organization: Not on file    Attends meetings of clubs or organizations: Not on file    Relationship status: Not on file  . Intimate partner violence:    Fear of current or ex partner: Not on file    Emotionally abused: Not on file    Physically abused: Not on file    Forced sexual activity: Not on file  Other Topics Concern  . Not on file  Social History Narrative   Married.   3 children, 11 grandchildren, 1 great grand child.  Retired. Worked at [employer], [place], [place], [place].   Enjoys spending time with her family.    Past Surgical History:  Procedure Laterality Date  . BUNIONECTOMY    . CATARACT EXTRACTION Right 08/2017  . HAMMER TOE SURGERY    . LAMINECTOMY    . MANDIBLE FRACTURE SURGERY    . MANDIBLE FRACTURE SURGERY      Family History  Problem Relation Age of Onset  . Stroke Mother   . Hypertension Mother   . Thyroid disease Mother   . Cancer Father        Brain Tumor  . Hypertension Sister   . Thyroid disease Sister   . Hypertension Brother   . Mental illness Brother   . Thyroid disease Brother   . COPD Brother   . Heart disease Brother   . Cancer Maternal Aunt   . Cancer Maternal Grandmother   . Diabetes Neg Hx     Allergies  Allergen Reactions    . Codeine   . Hydrocodone Nausea And Vomiting    Vicodin causes vomiting, but when takes Phenergan 0.25 mg with it, she can tolerate it.  . Tramadol Nausea And Vomiting    Causes vomiting & affects her heart rhythm.    Current Outpatient Medications on File Prior to Visit  Medication Sig Dispense Refill  . albuterol (PROVENTIL HFA;VENTOLIN HFA) 108 (90 Base) MCG/ACT inhaler Inhale 2 puffs into the lungs every 6 (six) hours as needed for wheezing or shortness of breath. 1 Inhaler 12  . etodolac (LODINE) 500 MG tablet TAKE 1 TABLET BY MOUTH ONCE DAILY AS NEEDED FOR PAIN 90 tablet 0  . fexofenadine (ALLEGRA) 180 MG tablet Take 180 mg by mouth daily.    Marland Kitchen gabapentin (NEURONTIN) 300 MG capsule Take 600 mg by mouth 3 (three) times daily.     Marland Kitchen lamoTRIgine (LAMICTAL) 100 MG tablet Take 1 tablet (100 mg total) by mouth daily. 90 tablet 0  . mometasone (NASONEX) 50 MCG/ACT nasal spray Instill 1 spray into each nostril twice daily. 17 g 3  . montelukast (SINGULAIR) 10 MG tablet Take 1 tablet (10 mg total) by mouth at bedtime. 90 tablet 3  . ondansetron (ZOFRAN-ODT) 4 MG disintegrating tablet Take 1 tablet (4 mg total) by mouth every 8 (eight) hours as needed for nausea or vomiting. 20 tablet 3  . oxyCODONE-acetaminophen (PERCOCET/ROXICET) 5-325 MG tablet Take by mouth 2 (two) times daily as needed.    . sertraline (ZOLOFT) 100 MG tablet Take 1 tablet (100 mg total) by mouth daily. 90 tablet 0   No current facility-administered medications on file prior to visit.     BP 116/72   Pulse 80   Temp 98.2 F (36.8 C) (Oral)   Ht 5' 3.25" (1.607 m)   Wt 163 lb 12 oz (74.3 kg)   LMP  (LMP Unknown)   SpO2 98%   BMI 28.78 kg/m    Objective:   Physical Exam  Constitutional: She appears well-nourished.  Neck: Neck supple.  Cardiovascular: Normal rate and regular rhythm.  Respiratory: Effort normal and breath sounds normal.  GI: Soft. Normal appearance and bowel sounds are normal. There is  tenderness in the epigastric area and left lower quadrant. There is CVA tenderness.  Left CVA tenderness  Skin: Skin is warm and dry.           Assessment & Plan:  Flank Pain:  Also with low grade fevers, lower back pain, fatigue, LLQ abdominal pain. She  doesn't appear sickly but doesn't appear to be feeling well. Differentials include UTI. Lower suspicion for acute pyelonephritis but will keep on list. Doubt diverticulitis. UA with 2+ leuks, negative nitrites and blood. She could not provide a urine culture in the clinic during her appointment, she will return later.  Given symptoms and exam, will treat for presumed UTI while we await culture results. Also check CBC and BMP.  Push intake of water and rest. She will update.  [name], NP

## 2018-07-22 NOTE — Patient Instructions (Addendum)
Stop by the lab prior to leaving today. I will notify you of your results once received.   Start Bactrim DS (sulfamethoxazole/trimethoprim) tablets for urinary tract infection. Take 1 tablet by mouth twice daily for 5 days.  Return later today to provide [place] with a urine sample.   Makes sure to drink plenty of water.  It was a pleasure to see you today!

## 2018-07-23 LAB — URINE CULTURE
MICRO NUMBER:: 91164463
SPECIMEN QUALITY:: ADEQUATE

## 2018-09-21 ENCOUNTER — Encounter: Payer: Self-pay | Admitting: Primary Care

## 2018-09-21 ENCOUNTER — Ambulatory Visit (HOSPITAL_COMMUNITY)
Admission: RE | Admit: 2018-09-21 | Discharge: 2018-09-21 | Disposition: A | Discharge status: Home / Self Care | Payer: PPO | Source: Ambulatory Visit | Attending: Primary Care | Admitting: Primary Care

## 2018-09-21 ENCOUNTER — Ambulatory Visit (INDEPENDENT_AMBULATORY_CARE_PROVIDER_SITE_OTHER): Payer: PPO | Admitting: Primary Care

## 2018-09-21 ENCOUNTER — Telehealth: Payer: Self-pay

## 2018-09-21 VITALS — BP 118/80 | HR 74 | Temp 98.7°F | Ht 63.25 in | Wt 160.0 lb

## 2018-09-21 DIAGNOSIS — K7689 Other specified diseases of liver: Secondary | ICD-10-CM | POA: Diagnosis not present

## 2018-09-21 DIAGNOSIS — D1801 Hemangioma of skin and subcutaneous tissue: Secondary | ICD-10-CM | POA: Diagnosis not present

## 2018-09-21 DIAGNOSIS — R350 Frequency of micturition: Secondary | ICD-10-CM

## 2018-09-21 DIAGNOSIS — R1030 Lower abdominal pain, unspecified: Secondary | ICD-10-CM | POA: Insufficient documentation

## 2018-09-21 DIAGNOSIS — R109 Unspecified abdominal pain: Secondary | ICD-10-CM | POA: Insufficient documentation

## 2018-09-21 DIAGNOSIS — Z23 Encounter for immunization: Secondary | ICD-10-CM | POA: Diagnosis not present

## 2018-09-21 LAB — CBC WITH DIFFERENTIAL/PLATELET
BASOS ABS: 0 10*3/uL (ref 0.0–0.1)
Basophils Relative: 0.4 % (ref 0.0–3.0)
EOS ABS: 0.2 10*3/uL (ref 0.0–0.7)
Eosinophils Relative: 2 % (ref 0.0–5.0)
HCT: 40.1 % (ref 36.0–46.0)
Hemoglobin: 13.1 g/dL (ref 12.0–15.0)
LYMPHS ABS: 1.7 10*3/uL (ref 0.7–4.0)
Lymphocytes Relative: 16.7 % (ref 12.0–46.0)
MCHC: 32.6 g/dL (ref 30.0–36.0)
MCV: 80.3 fl (ref 78.0–100.0)
MONO ABS: 1 10*3/uL (ref 0.1–1.0)
Monocytes Relative: 9.7 % (ref 3.0–12.0)
NEUTROS ABS: 7.2 10*3/uL (ref 1.4–7.7)
NEUTROS PCT: 71.2 % (ref 43.0–77.0)
Platelets: 312 10*3/uL (ref 150.0–400.0)
RBC: 4.99 Mil/uL (ref 3.87–5.11)
RDW: 14.4 % (ref 11.5–15.5)
WBC: 10.1 10*3/uL (ref 4.0–10.5)

## 2018-09-21 LAB — COMPREHENSIVE METABOLIC PANEL
ALT: 9 U/L (ref 0–35)
AST: 13 U/L (ref 0–37)
Albumin: 4.4 g/dL (ref 3.5–5.2)
Alkaline Phosphatase: 68 U/L (ref 39–117)
BILIRUBIN TOTAL: 0.6 mg/dL (ref 0.2–1.2)
BUN: 7 mg/dL (ref 6–23)
CHLORIDE: 102 meq/L (ref 96–112)
CO2: 30 meq/L (ref 19–32)
CREATININE: 0.89 mg/dL (ref 0.40–1.20)
Calcium: 9.6 mg/dL (ref 8.4–10.5)
GFR: 67.23 mL/min (ref >60.00)
GLUCOSE: 97 mg/dL (ref 70–99)
Potassium: 4.2 mEq/L (ref 3.5–5.1)
Sodium: 138 mEq/L (ref 135–145)
Total Protein: 7 g/dL (ref 6.0–8.3)

## 2018-09-21 LAB — POC URINALSYSI DIPSTICK (AUTOMATED)
Glucose, UA: NEGATIVE
Ketones, UA: NEGATIVE
NITRITE UA: NEGATIVE
PH UA: 6 (ref 5.0–8.0)
Protein, UA: NEGATIVE
RBC UA: NEGATIVE
Spec Grav, UA: 1.02 (ref 1.010–1.025)
UROBILINOGEN UA: 0.2 U/dL

## 2018-09-21 NOTE — Patient Instructions (Addendum)
Stop by the lab prior to leaving today. I will notify you of your results once received.   Stop by the front desk and speak with either Rosaria Ferries or Anastasiya regarding your ultrasound.   Please go to the hospital if you develop fevers, increased pain with nausea/vomiting.   It was a pleasure to see you today!

## 2018-09-21 NOTE — Telephone Encounter (Signed)
Noted.  See result note.  

## 2018-09-21 NOTE — Progress Notes (Signed)
Subjective:    Patient ID: Sharon Collins, female    DOB: Nov 28, 1950, 67 y.o.   MRN: [id number]  HPI  Sharon Collins is a 67 year old female with a history of renal stones (years ago), chronic back pain who presents today with a chief complaint of urinary frequency.  She also reports pelvic pressure, bilateral flank pain with intermittent radiation around to her left hip, chills. Her symptoms began in August 2019 and have been intermittent since. Over the last two days her symptoms have progressed.  She was last evaluated on July 22, 2018 with symptoms of bilateral lower back pain, left lower quadrant abdominal pain, low-grade fevers, urinary frequency, chronic diarrhea.  Exam that day overall unremarkable.  UA with 2+ leuks, she was treated with oral antibiotics  Culture returned which was negative for infection, antibiotics were discontinued.  Other labs were unremarkable.  She denies hematuria, vaginal discharge, unexplained weight loss, constipation. She's not taken anything OTC, only her prescribed etodolac and a few of her Oxycodone tablets with some improvement to her symptoms.   Review of Systems  Constitutional: Positive for chills. Negative for fever and unexpected weight change.  Respiratory: Negative for shortness of breath.   Cardiovascular: Negative for chest pain.  Gastrointestinal: Negative for abdominal pain, constipation, nausea and vomiting.  Genitourinary: Positive for flank pain and frequency. Negative for dysuria, hematuria, pelvic pain and vaginal discharge.       Pelvic pressure       Past Medical History:  Diagnosis Date  . Acute medial meniscus tear    left  . Allergic rhinitis   . Anxiety   . Anxiety and depression   . Asthma   . Atrophic vaginitis   . Cataracts, bilateral   . Cervical radiculopathy   . Insomnia   . Urinary incontinence      Social History   Socioeconomic History  . Marital status: Married    Spouse name: Not on file  .  Number of children: Not on file  . Years of education: Not on file  . Highest education level: Not on file  Occupational History  . Not on file  Social Needs  . Financial resource strain: Not on file  . Food insecurity:    Worry: Not on file    Inability: Not on file  . Transportation needs:    Medical: Not on file    Non-medical: Not on file  Tobacco Use  . Smoking status: Never Smoker  . Smokeless tobacco: Never Used  Substance and Sexual Activity  . Alcohol use: No  . Drug use: No  . Sexual activity: Not on file  Lifestyle  . Physical activity:    Days per week: Not on file    Minutes per session: Not on file  . Stress: Not on file  Relationships  . Social connections:    Talks on phone: Not on file    Gets together: Not on file    Attends religious service: Not on file    Active member of club or organization: Not on file    Attends meetings of clubs or organizations: Not on file    Relationship status: Not on file  . Intimate partner violence:    Fear of current or ex partner: Not on file    Emotionally abused: Not on file    Physically abused: Not on file    Forced sexual activity: Not on file  Other Topics Concern  . Not on file  Social History Narrative   Married.   3 children, 11 grandchildren, 1 great grand child.   Retired. Worked at [employer], [place], [place], [place].   Enjoys spending time with her family.    Past Surgical History:  Procedure Laterality Date  . BUNIONECTOMY    . CATARACT EXTRACTION Right 08/2017  . HAMMER TOE SURGERY    . LAMINECTOMY    . MANDIBLE FRACTURE SURGERY    . MANDIBLE FRACTURE SURGERY      Family History  Problem Relation Age of Onset  . Stroke Mother   . Hypertension Mother   . Thyroid disease Mother   . Cancer Father        Brain Tumor  . Hypertension Sister   . Thyroid disease Sister   . Hypertension Brother   . Mental illness Brother   . Thyroid disease Brother   . COPD Brother   . Heart disease  Brother   . Cancer Maternal Aunt   . Cancer Maternal Grandmother   . Diabetes Neg Hx     Allergies  Allergen Reactions  . Codeine   . Hydrocodone Nausea And Vomiting    Vicodin causes vomiting, but when takes Phenergan 0.25 mg with it, she can tolerate it.  . Tramadol Nausea And Vomiting    Causes vomiting & affects her heart rhythm.    Current Outpatient Medications on File Prior to Visit  Medication Sig Dispense Refill  . albuterol (PROVENTIL HFA;VENTOLIN HFA) 108 (90 Base) MCG/ACT inhaler Inhale 2 puffs into the lungs every 6 (six) hours as needed for wheezing or shortness of breath. 1 Inhaler 12  . etodolac (LODINE) 500 MG tablet Take 1 tablet by mouth once daily as needed for pain. 90 tablet 0  . fexofenadine (ALLEGRA) 180 MG tablet Take 180 mg by mouth daily.    Marland Kitchen gabapentin (NEURONTIN) 300 MG capsule Take 600 mg by mouth 3 (three) times daily.     Marland Kitchen lamoTRIgine (LAMICTAL) 100 MG tablet Take 1 tablet (100 mg total) by mouth daily. 90 tablet 0  . magic mouthwash w/lidocaine SOLN Take 10 mLs by mouth 3 (three) times daily as needed for mouth pain. 240 mL 0  . mometasone (NASONEX) 50 MCG/ACT nasal spray Instill 1 spray into each nostril twice daily. 17 g 3  . montelukast (SINGULAIR) 10 MG tablet Take 1 tablet (10 mg total) by mouth at bedtime. 90 tablet 3  . ondansetron (ZOFRAN-ODT) 4 MG disintegrating tablet Take 1 tablet (4 mg total) by mouth every 8 (eight) hours as needed for nausea or vomiting. 20 tablet 3  . oxyCODONE-acetaminophen (PERCOCET/ROXICET) 5-325 MG tablet Take by mouth 2 (two) times daily as needed.    . sertraline (ZOLOFT) 100 MG tablet Take 1 tablet (100 mg total) by mouth daily. 90 tablet 0   No current facility-administered medications on file prior to visit.     BP 118/80   Pulse 74   Temp 98.7 F (37.1 C) (Oral)   Ht 5' 3.25" (1.607 m)   Wt 160 lb (72.6 kg)   LMP  (LMP Unknown)   SpO2 97%   BMI 28.12 kg/m    Objective:   Physical Exam    Constitutional: She appears well-nourished.  Cardiovascular: Normal rate and regular rhythm.  Respiratory: Effort normal and breath sounds normal.  GI: Soft. Normal appearance and bowel sounds are normal. There is tenderness in the suprapubic area. There is CVA tenderness. There is no rebound, no tenderness at McBurney's point and  negative Murphy's sign.    Skin: Skin is warm and dry.           Assessment & Plan:

## 2018-09-21 NOTE — Telephone Encounter (Signed)
No abnormalities in regards to kidneys. Increased size of hepatic cyst and hemangioma.   Report in Epic. Advised her she could send the pt home and we would call her with further information.

## 2018-09-21 NOTE — Assessment & Plan Note (Signed)
More suprapubic, but noted bilaterally.  This has been chronic for nearly 3 months, progressed over the last couple days.  UA today with 1+ leuks, negative blood, negative nitrites.  Culture sent.  Labs pending today including CBC with differential, CMP.  Given CVA tenderness coupled with chronicity and progression of symptoms, check a CT renal stone protocol.  She appears stable and is appropriate for outpatient treatment.  Strict emergency department precautions provided.

## 2018-09-22 LAB — URINE CULTURE
MICRO NUMBER:: 91431157
RESULT: NO GROWTH
SPECIMEN QUALITY:: ADEQUATE

## 2018-09-26 ENCOUNTER — Telehealth: Payer: Self-pay | Admitting: *Deleted

## 2018-09-26 NOTE — Telephone Encounter (Addendum)
Notes recorded by [name], NP on 09/26/2018 at 1:55 PM EST Please notify patient that given the grossly normal CT scan that she may want to speak with her pain management/orthopedic doctor regarding her symptoms. If they don't see an obvious cause then we can send her to Urology.  ----- Message from [name], NP sent at 09/24/2018  1:14 PM EST ----- Please notify patient that her urine culture is negative for bacteria.

## 2018-09-26 NOTE — Telephone Encounter (Signed)
Message left for patient to return my call.  

## 2018-09-28 ENCOUNTER — Other Ambulatory Visit: Payer: Self-pay | Admitting: Primary Care

## 2018-09-28 DIAGNOSIS — R109 Unspecified abdominal pain: Secondary | ICD-10-CM

## 2018-09-28 DIAGNOSIS — R350 Frequency of micturition: Secondary | ICD-10-CM

## 2018-09-28 NOTE — Telephone Encounter (Signed)
Addressed in result note.  

## 2018-10-12 DIAGNOSIS — G894 Chronic pain syndrome: Secondary | ICD-10-CM | POA: Diagnosis not present

## 2018-10-12 DIAGNOSIS — M961 Postlaminectomy syndrome, not elsewhere classified: Secondary | ICD-10-CM | POA: Diagnosis not present

## 2018-10-12 DIAGNOSIS — M47816 Spondylosis without myelopathy or radiculopathy, lumbar region: Secondary | ICD-10-CM | POA: Diagnosis not present

## 2018-10-12 DIAGNOSIS — M25562 Pain in left knee: Secondary | ICD-10-CM | POA: Diagnosis not present

## 2018-10-12 DIAGNOSIS — G8929 Other chronic pain: Secondary | ICD-10-CM | POA: Diagnosis not present

## 2018-10-12 DIAGNOSIS — M4722 Other spondylosis with radiculopathy, cervical region: Secondary | ICD-10-CM | POA: Diagnosis not present

## 2018-10-12 DIAGNOSIS — M533 Sacrococcygeal disorders, not elsewhere classified: Secondary | ICD-10-CM | POA: Diagnosis not present

## 2018-11-04 DIAGNOSIS — M25562 Pain in left knee: Secondary | ICD-10-CM | POA: Diagnosis not present

## 2018-11-04 DIAGNOSIS — M1712 Unilateral primary osteoarthritis, left knee: Secondary | ICD-10-CM | POA: Diagnosis not present

## 2018-11-14 ENCOUNTER — Encounter: Payer: Self-pay | Admitting: Urology

## 2018-11-14 ENCOUNTER — Ambulatory Visit: Payer: PPO | Admitting: Urology

## 2018-11-14 VITALS — BP 106/70 | HR 66 | Ht 63.25 in | Wt 157.0 lb

## 2018-11-14 DIAGNOSIS — F319 Bipolar disorder, unspecified: Secondary | ICD-10-CM | POA: Diagnosis not present

## 2018-11-14 DIAGNOSIS — R109 Unspecified abdominal pain: Secondary | ICD-10-CM

## 2018-11-14 DIAGNOSIS — F5105 Insomnia due to other mental disorder: Secondary | ICD-10-CM | POA: Diagnosis not present

## 2018-11-14 LAB — URINALYSIS, COMPLETE
Bilirubin, UA: NEGATIVE
Glucose, UA: NEGATIVE
Ketones, UA: NEGATIVE
LEUKOCYTES UA: NEGATIVE
Nitrite, UA: NEGATIVE
PH UA: 6 (ref 5.0–7.5)
PROTEIN UA: NEGATIVE
RBC, UA: NEGATIVE
Specific Gravity, UA: 1.02 (ref 1.005–1.030)
Urobilinogen, Ur: 0.2 mg/dL (ref 0.2–1.0)

## 2018-11-14 LAB — MICROSCOPIC EXAMINATION
EPITHELIAL CELLS (NON RENAL): NONE SEEN /HPF (ref 0–10)
WBC UA: NONE SEEN /HPF (ref 0–5)

## 2018-11-14 NOTE — Progress Notes (Signed)
11/14/2018 10:58 AM   Sharon Collins 1951-05-28 [id number]  Referring provider: [name], NP [place], [place] [address]  Chief Complaint  Patient presents with  . Urinary Frequency    HPI: I was consulted to assist the patient's urinary symptoms and recent CT scan.  She has bilateral flank pain that comes and goes and goes from one side to the other is the primary complaint and I do believe has been seen at a pain clinic in [facility].  She can hold urination for many hours almost all day and then leak a small amount as a trigger to go to the restroom.  She can leak with coughing sneezing during allergy season and she can have a little bit urge incontinence but generally does not wear a pad  She describes a hysterectomy and bladder suspension many years ago and perhaps a dilation afterwards for retention.  Some of the details were difficult to ascertain.  Currently she has hesitancy.  She will get up sometimes 5 or 6 times a night.  She denies ankle edema and does not take Lasix  She had a CT scan which showed a small hernia but perhaps a foreign body in her bladder.  The findings were on each side in the pelvis.  There was no hydronephrosis and her kidneys were normal  Modifying factors: There are no other modifying factors  Associated signs and symptoms: There are no other associated signs and symptoms Aggravating and relieving factors: There are no other aggravating or relieving factors Severity: Moderate Duration: Persistent     PMH: Past Medical History:  Diagnosis Date  . Acute medial meniscus tear    left  . Allergic rhinitis   . Anxiety   . Anxiety and depression   . Asthma   . Atrophic vaginitis   . Cataracts, bilateral   . Cervical radiculopathy   . Insomnia   . Urinary incontinence     Surgical History: Past Surgical History:  Procedure Laterality Date  . BUNIONECTOMY    . CATARACT EXTRACTION Right 08/2017  . HAMMER TOE  SURGERY    . LAMINECTOMY    . MANDIBLE FRACTURE SURGERY    . MANDIBLE FRACTURE SURGERY      Home Medications:  Allergies as of 11/14/2018      Reactions   Codeine    Hydrocodone Nausea And Vomiting   Vicodin causes vomiting, but when takes Phenergan 0.25 mg with it, she can tolerate it.   Tramadol Nausea And Vomiting   Causes vomiting & affects her heart rhythm.      Medication List       Accurate as of November 14, 2018 10:58 AM. Always use your most recent med list.        albuterol 108 (90 Base) MCG/ACT inhaler Commonly known as:  PROVENTIL HFA;VENTOLIN HFA Inhale 2 puffs into the lungs every 6 (six) hours as needed for wheezing or shortness of breath.   etodolac 500 MG tablet Commonly known as:  LODINE Take 1 tablet by mouth once daily as needed for pain.   fexofenadine 180 MG tablet Commonly known as:  ALLEGRA Take 180 mg by mouth daily.   gabapentin 300 MG capsule Commonly known as:  NEURONTIN Take 600 mg by mouth 3 (three) times daily.   lamoTRIgine 100 MG tablet Commonly known as:  LAMICTAL Take 1 tablet (100 mg total) by mouth daily.   magic mouthwash w/lidocaine Soln Take 10 mLs by mouth  3 (three) times daily as needed for mouth pain.   mometasone 50 MCG/ACT nasal spray Commonly known as:  NASONEX Instill 1 spray into each nostril twice daily.   montelukast 10 MG tablet Commonly known as:  SINGULAIR Take 1 tablet (10 mg total) by mouth at bedtime.   ondansetron 4 MG disintegrating tablet Commonly known as:  ZOFRAN-ODT Take 1 tablet (4 mg total) by mouth every 8 (eight) hours as needed for nausea or vomiting.   oxyCODONE-acetaminophen 5-325 MG tablet Commonly known as:  PERCOCET/ROXICET Take by mouth 2 (two) times daily as needed.   sertraline 100 MG tablet Commonly known as:  ZOLOFT Take 1 tablet (100 mg total) by mouth daily.       Allergies:  Allergies  Allergen Reactions  . Codeine   . Hydrocodone Nausea And Vomiting    Vicodin  causes vomiting, but when takes Phenergan 0.25 mg with it, she can tolerate it.  . Tramadol Nausea And Vomiting    Causes vomiting & affects her heart rhythm.    Family History: Family History  Problem Relation Age of Onset  . Stroke Mother   . Hypertension Mother   . Thyroid disease Mother   . Cancer Father        Brain Tumor  . Hypertension Sister   . Thyroid disease Sister   . Hypertension Brother   . Mental illness Brother   . Thyroid disease Brother   . COPD Brother   . Heart disease Brother   . Cancer Maternal Aunt   . Cancer Maternal Grandmother   . Diabetes Neg Hx     Social History:  reports that she has never smoked. She has never used smokeless tobacco. She reports that she does not drink alcohol or use drugs.  ROS: UROLOGY Frequent Urination?: No Hard to postpone urination?: No Burning/pain with urination?: No Get up at night to urinate?: Yes Leakage of urine?: Yes Urine stream starts and stops?: No Trouble starting stream?: Yes Do you have to strain to urinate?: No Blood in urine?: No Urinary tract infection?: No Sexually transmitted disease?: No Injury to kidneys or bladder?: No Painful intercourse?: No Weak stream?: No Currently pregnant?: No Vaginal bleeding?: No Last menstrual period?: n  Gastrointestinal Nausea?: No Vomiting?: No Indigestion/heartburn?: No Diarrhea?: No Constipation?: No  Constitutional Fever: No Night sweats?: No Weight loss?: No Fatigue?: No  Skin Skin rash/lesions?: No Itching?: No  Eyes Blurred vision?: No Double vision?: No  Ears/Nose/Throat Sore throat?: No Sinus problems?: No  Hematologic/Lymphatic Swollen glands?: No Easy bruising?: No  Cardiovascular Leg swelling?: No Chest pain?: No  Respiratory Cough?: No Shortness of breath?: No  Endocrine Excessive thirst?: No  Musculoskeletal Back pain?: Yes Joint pain?: Yes  Neurological Headaches?: No Dizziness?:  No  Psychologic Depression?: Yes Anxiety?: No  Physical Exam: BP 106/70 (BP Location: Left Arm, Patient Position: Sitting, Cuff Size: Normal)   Pulse 66   Ht 5' 3.25" (1.607 m)   Wt 157 lb (71.2 kg)   LMP  (LMP Unknown)   BMI 27.59 kg/m   Constitutional:  Alert and oriented, No acute distress. HEENT: [place] AT, moist mucus membranes.  Trachea midline, no masses. Cardiovascular: No clubbing, cyanosis, or edema. Respiratory: Normal respiratory effort, no increased work of breathing. GI: Abdomen is soft, nontender, nondistended, no abdominal masses GU: No prolapse or stress incontinence.  Good vaginal length.  No foreign body in vagina Skin: No rashes, bruises or suspicious lesions. Lymph: No cervical or inguinal adenopathy. Neurologic: Grossly  intact, no focal deficits, moving all 4 extremities. Psychiatric: Normal mood and affect.  Laboratory Data: Lab Results  Component Value Date   WBC 10.1 09/21/2018   HGB 13.1 09/21/2018   HCT 40.1 09/21/2018   MCV 80.3 09/21/2018   PLT 312.0 09/21/2018    Lab Results  Component Value Date   CREATININE 0.89 09/21/2018    No results found for: PSA  No results found for: TESTOSTERONE  Lab Results  Component Value Date   HGBA1C 5.6 09/22/2016    Urinalysis    Component Value Date/Time   APPEARANCEUR Cloudy (A) 10/20/2016 1317   GLUCOSEU Negative 10/20/2016 1317   BILIRUBINUR 1+ 09/21/2018 1138   BILIRUBINUR Negative 10/20/2016 1317   PROTEINUR Negative 09/21/2018 1138   PROTEINUR Negative 10/20/2016 1317   UROBILINOGEN 0.2 09/21/2018 1138   NITRITE Negative 09/21/2018 1138   NITRITE Positive (A) 10/20/2016 1317   LEUKOCYTESUR Small (1+) (A) 09/21/2018 1138   LEUKOCYTESUR 1+ (A) 10/20/2016 1317    Pertinent Imaging:   Assessment & Plan: The patient's flank pain is not from her kidneys.  She likely had a bladder suspension perhaps with bone anchors into the retro-symphysis.  The role of cystoscopy described.  She does  have some hesitancy but otherwise other than nocturia she does not have a lot of other bladder symptoms.  Her nighttime frequency seems to be small-volume.  She had no microscopic hematuria today  Picture drawn.  Likely the foreign body is outside her bladder but for safety reasons she will come back for elective cystoscopy.  She understands findings not related to back pain  1. Flank pain  - Urinalysis, Complete   No follow-ups on file.  Reece Packer, MD  [facility] [address] [place], [place] [address] [phone]

## 2019-01-12 ENCOUNTER — Other Ambulatory Visit: Payer: Self-pay

## 2019-01-12 ENCOUNTER — Encounter: Payer: Self-pay | Admitting: Internal Medicine

## 2019-01-12 ENCOUNTER — Ambulatory Visit (INDEPENDENT_AMBULATORY_CARE_PROVIDER_SITE_OTHER): Payer: PPO | Admitting: Internal Medicine

## 2019-01-12 VITALS — BP 108/72 | HR 73 | Temp 98.4°F | Wt 159.0 lb

## 2019-01-12 DIAGNOSIS — J301 Allergic rhinitis due to pollen: Secondary | ICD-10-CM | POA: Diagnosis not present

## 2019-01-12 DIAGNOSIS — J3089 Other allergic rhinitis: Secondary | ICD-10-CM | POA: Diagnosis not present

## 2019-01-12 NOTE — Patient Instructions (Signed)

## 2019-01-12 NOTE — Progress Notes (Signed)
HPI  Pt presents to the clinic today with c/o right ear pain, sores in her nose and sore throat. This started 2 weeks ago. She describes the ear pain as achy, without drainage or loss of hearing. She denies difficulty swallowing. She denies runny nose, nasal congestion, cough or SOB. She denies fever, chills or body aches. She has tried Xyzal and Singulair with minimal relief. She has a history of allergies and asthma. She had an appt with an allergist but missed the appt. She has not had sick contacts that she is aware of.   Review of Systems      Past Medical History:  Diagnosis Date  . Acute medial meniscus tear    left  . Allergic rhinitis   . Anxiety   . Anxiety and depression   . Asthma   . Atrophic vaginitis   . Cataracts, bilateral   . Cervical radiculopathy   . Insomnia   . Urinary incontinence     Family History  Problem Relation Age of Onset  . Stroke Mother   . Hypertension Mother   . Thyroid disease Mother   . Cancer Father        Brain Tumor  . Hypertension Sister   . Thyroid disease Sister   . Hypertension Brother   . Mental illness Brother   . Thyroid disease Brother   . COPD Brother   . Heart disease Brother   . Cancer Maternal Aunt   . Cancer Maternal Grandmother   . Diabetes Neg Hx     Social History   Socioeconomic History  . Marital status: Married    Spouse name: Not on file  . Number of children: Not on file  . Years of education: Not on file  . Highest education level: Not on file  Occupational History  . Not on file  Social Needs  . Financial resource strain: Not on file  . Food insecurity:    Worry: Not on file    Inability: Not on file  . Transportation needs:    Medical: Not on file    Non-medical: Not on file  Tobacco Use  . Smoking status: Never Smoker  . Smokeless tobacco: Never Used  Substance and Sexual Activity  . Alcohol use: No  . Drug use: No  . Sexual activity: Not on file  Lifestyle  . Physical activity:   Days per week: Not on file    Minutes per session: Not on file  . Stress: Not on file  Relationships  . Social connections:    Talks on phone: Not on file    Gets together: Not on file    Attends religious service: Not on file    Active member of club or organization: Not on file    Attends meetings of clubs or organizations: Not on file    Relationship status: Not on file  . Intimate partner violence:    Fear of current or ex partner: Not on file    Emotionally abused: Not on file    Physically abused: Not on file    Forced sexual activity: Not on file  Other Topics Concern  . Not on file  Social History Narrative   Married.   3 children, 11 grandchildren, 1 great grand child.   Retired. Worked at [employer], [place], [place], [place].   Enjoys spending time with her family.    Allergies  Allergen Reactions  . Codeine   . Hydrocodone Nausea And Vomiting  Vicodin causes vomiting, but when takes Phenergan 0.25 mg with it, she can tolerate it.  . Tramadol Nausea And Vomiting    Causes vomiting & affects her heart rhythm.     Constitutional: Denies headache, fatigue, fever or abrupt weight changes.  HEENT:  Positive ear pain, nasal sores, sore throat. Denies eye redness, eye pain, pressure behind the eyes, facial pain, nasal congestion, ringing in the ears, wax buildup, runny nose or bloody nose. Respiratory:  Denies cough, difficulty breathing or shortness of breath.  Cardiovascular: Denies chest pain, chest tightness, palpitations or swelling in the hands or feet.   No other specific complaints in a complete review of systems (except as listed in HPI above).  Objective:   BP 108/72   Pulse 73   Temp 98.4 F (36.9 C) (Oral)   Wt 159 lb (72.1 kg)   LMP  (LMP Unknown)   SpO2 99%   BMI 27.94 kg/m   Wt Readings from Last 3 Encounters:  11/14/18 157 lb (71.2 kg)  09/21/18 160 lb (72.6 kg)  07/22/18 163 lb 12 oz (74.3 kg)     General: Appears her stated age,  well developed, well nourished in NAD. HEENT: Head: normal shape and size, mild maxillary sinus tenderness noted; Ears: Tm's gray and intact, normal light reflex; Nose: mucosa pink and moist, septum midline; Throat/Mouth: + PND. Teeth present, mucosa erythematous and moist, no exudate noted, no lesions or ulcerations noted.  Neck: No cervical lymphadenopathy.  Cardiovascular: Normal rate and rhythm. S1,S2 noted.  No murmur, rubs or gallops noted.  Pulmonary/Chest: Normal effort and positive vesicular breath sounds. No respiratory distress. No wheezes, rales or ronchi noted.       Assessment & Plan:   Allergic Rhinitis:  Get some rest and drink plenty of water 80 mg Depo IM today Continue Xyzal, Singulair Astelin refilled Follow up with allergy as needed  RTC as needed or if symptoms persist.   Webb Silversmith, NP

## 2019-01-13 ENCOUNTER — Telehealth: Payer: Self-pay | Admitting: Primary Care

## 2019-01-13 MED ORDER — AZELASTINE HCL 0.1 % NA SOLN: 1.0000 | Freq: Two times a day (BID) | NASAL | 12 refills | Status: DC

## 2019-01-13 MED ORDER — MOMETASONE FUROATE 50 MCG/ACT NA SUSP: NASAL | 3 refills | Status: DC

## 2019-01-13 NOTE — Telephone Encounter (Signed)
[facility] Night - Client Nonclinical Telephone Record [facility] Night - Client Client Site [place] - Night Physician Alma Friendly - NP Contact Type Call Who Is Calling Patient / Member / Family / Caregiver Caller Name Sharon Collins Caller Phone Number [phone] Relationship To Patient Self Patient Name Sharon Collins Patient DOB 08-29-1951 Reason for Call Symptomatic / Request for [place] states had a 3:30pm appt today, prescription was suppose to have been called in to [facility] but they haven't got it and they close early. Received a steroid shot at the office. Paging DoctorName Phone DateTime Result/Outcome Message Type Notes Scarlette Calico - MD [phone] 01/12/2019 6:43:12 PM Paged On Call Back to Call Center Doctor Paged This message is from [place] in the call center. Please call us back at [phone] regarding a page. Scarlette Calico - MD 01/12/2019 6:46:48 PM Spoke with On Call - General Message Result Call Closed By: Corey Harold Transaction Date/Time: 01/12/2019 5:15:49 PM (ET)

## 2019-01-13 NOTE — Addendum Note (Signed)
Addended by: Lurlean Nanny on: 01/13/2019 10:32 AM   Modules accepted: Orders

## 2019-01-13 NOTE — Addendum Note (Signed)
Addended by: Jearld Fenton on: 01/13/2019 02:02 PM   Modules accepted: Orders

## 2019-01-13 NOTE — Telephone Encounter (Signed)
Astelin sent to pharmacy

## 2019-01-13 NOTE — Telephone Encounter (Signed)
Pt was seen 3/19 and did not receive the refill for Astelin. Please send to [facility] on [address].

## 2019-01-20 ENCOUNTER — Ambulatory Visit: Payer: PPO

## 2019-01-23 ENCOUNTER — Encounter: Payer: PPO | Admitting: Primary Care

## 2019-03-08 DIAGNOSIS — F5105 Insomnia due to other mental disorder: Secondary | ICD-10-CM | POA: Diagnosis not present

## 2019-03-08 DIAGNOSIS — F319 Bipolar disorder, unspecified: Secondary | ICD-10-CM | POA: Diagnosis not present

## 2019-03-28 ENCOUNTER — Ambulatory Visit (INDEPENDENT_AMBULATORY_CARE_PROVIDER_SITE_OTHER): Payer: PPO

## 2019-03-28 ENCOUNTER — Other Ambulatory Visit: Payer: Self-pay | Admitting: Primary Care

## 2019-03-28 DIAGNOSIS — E2839 Other primary ovarian failure: Secondary | ICD-10-CM | POA: Diagnosis not present

## 2019-03-28 DIAGNOSIS — Z0000 Encounter for general adult medical examination without abnormal findings: Secondary | ICD-10-CM

## 2019-03-28 DIAGNOSIS — Z1239 Encounter for other screening for malignant neoplasm of breast: Secondary | ICD-10-CM

## 2019-03-28 NOTE — Progress Notes (Signed)
PCP notes:   Health maintenance:  PPSV23- PCP follow-up requested Tetanus vaccine - postponed/insurance Mammogram - order generated for PCP approval Bone density - order generated for PCP approval   Abnormal screenings:   None  Patient concerns:   None  Nurse concerns:  None  Next PCP appt:   03/31/19 @ 1140

## 2019-03-28 NOTE — Progress Notes (Signed)
Subjective:   Sharon Collins is a 68 y.o. female who presents for Medicare Annual (Subsequent) preventive examination.  Review of Systems:  N/A Cardiac Risk Factors include: advanced age (>45men, >68 women)     Objective:     Vitals: LMP  (LMP Unknown)   There is no height or weight on file to calculate BMI.  Advanced Directives 03/28/2019  Does Patient Have a Medical Advance Directive? No  Would patient like information on creating a medical advance directive? No - Patient declined    Tobacco Social History   Tobacco Use  Smoking Status Never Smoker  Smokeless Tobacco Never Used     Counseling given: No   Clinical Intake:  Pre-visit preparation completed: Yes  Pain : No/denies pain Pain Score: 0-No pain     Nutritional Status: BMI 25 -29 Overweight Nutritional Risks: None Diabetes: No  How often do you need to have someone help you when you read instructions, pamphlets, or other written materials from your doctor or pharmacy?: 1 - Never What is the last grade level you completed in school?: 11th grade  Interpreter Needed?: No  Comments: pt lives with spouse Information entered by :: LPinson. LPN  Past Medical History:  Diagnosis Date  . Acute medial meniscus tear    left  . Allergic rhinitis   . Anxiety   . Anxiety and depression   . Asthma   . Atrophic vaginitis   . Cataracts, bilateral   . Cervical radiculopathy   . Insomnia   . Urinary incontinence    Past Surgical History:  Procedure Laterality Date  . BUNIONECTOMY    . CATARACT EXTRACTION Right 08/2017  . HAMMER TOE SURGERY    . LAMINECTOMY    . MANDIBLE FRACTURE SURGERY    . MANDIBLE FRACTURE SURGERY     Family History  Problem Relation Age of Onset  . Stroke Mother   . Hypertension Mother   . Thyroid disease Mother   . Cancer Father        Brain Tumor  . Hypertension Sister   . Thyroid disease Sister   . Hypertension Brother   . Mental illness Brother   . Thyroid disease  Brother   . COPD Brother   . Heart disease Brother   . Cancer Maternal Aunt   . Cancer Maternal Grandmother   . Diabetes Neg Hx    Social History   Socioeconomic History  . Marital status: Married    Spouse name: Not on file  . Number of children: Not on file  . Years of education: Not on file  . Highest education level: Not on file  Occupational History  . Not on file  Social Needs  . Financial resource strain: Not on file  . Food insecurity:    Worry: Not on file    Inability: Not on file  . Transportation needs:    Medical: Not on file    Non-medical: Not on file  Tobacco Use  . Smoking status: Never Smoker  . Smokeless tobacco: Never Used  Substance and Sexual Activity  . Alcohol use: No  . Drug use: No  . Sexual activity: Not on file  Lifestyle  . Physical activity:    Days per week: Not on file    Minutes per session: Not on file  . Stress: Not on file  Relationships  . Social connections:    Talks on phone: Not on file    Gets together: Not on file  Attends religious service: Not on file    Active member of club or organization: Not on file    Attends meetings of clubs or organizations: Not on file    Relationship status: Not on file  Other Topics Concern  . Not on file  Social History Narrative   Married.   3 children, 11 grandchildren, 1 great grand child.   Retired. Worked at [employer], [place], [place], [place].   Enjoys spending time with her family.    Outpatient Encounter Medications as of 03/28/2019  Medication Sig  . albuterol (PROVENTIL HFA;VENTOLIN HFA) 108 (90 Base) MCG/ACT inhaler Inhale 2 puffs into the lungs every 6 (six) hours as needed for wheezing or shortness of breath.  Marland Kitchen azelastine (ASTELIN) 0.1 % nasal spray Place 1 spray into both nostrils 2 (two) times daily. Use in each nostril as directed  . etodolac (LODINE) 500 MG tablet Take 1 tablet by mouth once daily as needed for pain.  . fexofenadine (ALLEGRA) 180 MG tablet Take  180 mg by mouth daily.  Marland Kitchen gabapentin (NEURONTIN) 300 MG capsule Take 600 mg by mouth 3 (three) times daily.   . hydrOXYzine (ATARAX/VISTARIL) 25 MG tablet   . lamoTRIgine (LAMICTAL) 100 MG tablet Take 1 tablet (100 mg total) by mouth daily.  . magic mouthwash w/lidocaine SOLN Take 10 mLs by mouth 3 (three) times daily as needed for mouth pain.  . mometasone (NASONEX) 50 MCG/ACT nasal spray Instill 1 spray into each nostril twice daily.  . montelukast (SINGULAIR) 10 MG tablet Take 1 tablet (10 mg total) by mouth at bedtime.  . ondansetron (ZOFRAN-ODT) 4 MG disintegrating tablet Take 1 tablet (4 mg total) by mouth every 8 (eight) hours as needed for nausea or vomiting.  Marland Kitchen oxyCODONE-acetaminophen (PERCOCET/ROXICET) 5-325 MG tablet Take by mouth 2 (two) times daily as needed.  . sertraline (ZOLOFT) 100 MG tablet Take 1 tablet (100 mg total) by mouth daily.   No facility-administered encounter medications on file as of 03/28/2019.     Activities of Daily Living In your present state of health, do you have any difficulty performing the following activities: 03/28/2019  Hearing? N  Vision? N  Difficulty concentrating or making decisions? N  Walking or climbing stairs? N  Dressing or bathing? N  Doing errands, shopping? N  Preparing Food and eating ? N  Using the Toilet? N  In the past six months, have you accidently leaked urine? N  Do you have problems with loss of bowel control? N  Managing your Medications? N  Managing your Finances? N  Housekeeping or managing your Housekeeping? N  Some recent data might be hidden    Patient Care Team: [name], NP as PCP - General (Internal Medicine)    Assessment:   This is a routine wellness examination for [name].  Vision Screening Comments: Vision exam in 2019 with Dr. Marvel Plan  Exercise Activities and Dietary recommendations Current Exercise Habits: Home exercise routine, Type of exercise: stretching, Time (Minutes): 15, Frequency  (Times/Week): 3, Weekly Exercise (Minutes/Week): 45, Intensity: Mild, Exercise limited by: None identified  Goals    . Patient Stated     Starting 03/28/2019, I will continue to stretch for 15-20 minutes 2-3 times per week.        Fall Risk Fall Risk  03/28/2019 01/18/2018 12/17/2017 09/22/2016  Falls in the past year? 0 No No No   Depression Screen PHQ 2/9 Scores 03/28/2019 01/18/2018 09/22/2016  PHQ - 2 Score 0 0 0  PHQ- 9 Score 0 - 5     Cognitive Function MMSE - Mini Mental State Exam 03/28/2019 09/22/2016  Orientation to time 5 5  Orientation to Place 5 5  Registration 3 3  Attention/ Calculation 0 5  Recall 3 3  Language- name 2 objects 0 2  Language- repeat 1 1  Language- follow 3 step command 0 3  Language- read & follow direction 0 1  Write a sentence 0 1  Copy design 0 1  Total score 17 30       PLEASE NOTE: A Mini-Cog screen was completed. Maximum score is 17. A value of 0 denotes this part of Folstein MMSE was not completed or the patient failed this part of the Mini-Cog screening.   Mini-Cog Screening Orientation to Time - Max 5 pts Orientation to Place - Max 5 pts Registration - Max 3 pts Recall - Max 3 pts Language Repeat - Max 1 pts    Immunization History  Administered Date(s) Administered  . Influenza, High Dose Seasonal PF 09/22/2016  . Influenza,inj,Quad PF,6+ Mos 12/18/2015, 12/17/2017, 09/21/2018  . Influenza,inj,quad, With Preservative 12/23/2017  . Influenza-Unspecified 07/19/2014  . Pneumococcal Conjugate-13 09/22/2016  . Td 10/26/2008  . Zoster 07/09/2014    Screening Tests Health Maintenance  Topic Date Due  . PNA vac Low Risk Adult (2 of 2 - PPSV23) 03/31/2019 (Originally 09/22/2017)  . MAMMOGRAM  10/26/2019 (Originally 10/31/2015)  . DEXA SCAN  10/26/2019 (Originally 09/03/2016)  . TETANUS/TDAP  10/25/2020 (Originally 10/26/2018)  . INFLUENZA VACCINE  05/27/2019  . COLONOSCOPY  08/27/2023  . Hepatitis C Screening  Completed      Plan:     I have personally reviewed, addressed, and noted the following in the patient's chart:  A. Medical and social history B. Use of alcohol, tobacco or illicit drugs  C. Current medications and supplements D. Functional ability and status E.  Nutritional status F.  Physical activity G. Advance directives H. List of other physicians I.  Hospitalizations, surgeries, and ER visits in previous 12 months J.  Vitals (unless it is a telemedicine encounter) K. Screenings to include cognitive, depression, hearing, vision (NOTE: hearing and vision screenings not completed in telemedicine encounter) L. Referrals and appointments   In addition, I have reviewed and discussed with patient certain preventive protocols, quality metrics, and best practice recommendations. A written personalized care plan for preventive services and recommendations were provided to patient.  With patient's permission, we connected on 03/28/19 at 12:00 PM EDT by a video enabled telemedicine application. Two patient identifiers were used to ensure the encounter occurred with the correct person.    Patient was in home and writer was in office.   Signed,   Lindell Noe, MHA, BS, LPN Health Coach

## 2019-03-28 NOTE — Patient Instructions (Signed)
Sharon Collins , Thank you for taking time to come for your Medicare Wellness Visit. I appreciate your ongoing commitment to your health goals. Please review the following plan we discussed and let me know if I can assist you in the future.   These are the goals we discussed: Goals    . Patient Stated     Starting 03/28/2019, I will continue to stretch for 15-20 minutes 2-3 times per week.        This is a list of the screening recommended for you and due dates:  Health Maintenance  Topic Date Due  . Pneumonia vaccines (2 of 2 - PPSV23) 03/31/2019*  . Mammogram  10/26/2019*  . DEXA scan (bone density measurement)  10/26/2019*  . Tetanus Vaccine  10/25/2020*  . Flu Shot  05/27/2019  . Colon Cancer Screening  08/27/2023  .  Hepatitis C: One time screening is recommended by Center for Disease Control  (CDC) for  adults born from [birth year] through 1965.   Completed  *Topic was postponed. The date shown is not the original due date.   Preventive Care for Adults  A healthy lifestyle and preventive care can promote health and wellness. Preventive health guidelines for adults include the following key practices.  . A routine yearly physical is a good way to check with your health care provider about your health and preventive screening. It is a chance to share any concerns and updates on your health and to receive a thorough exam.  . Visit your dentist for a routine exam and preventive care every 6 months. Brush your teeth twice a day and floss once a day. Good oral hygiene prevents tooth decay and gum disease.  . The frequency of eye exams is based on your age, health, family medical history, use  of contact lenses, and other factors. Follow your health care provider's recommendations for frequency of eye exams.  . Eat a healthy diet. Foods like vegetables, fruits, whole grains, low-fat dairy products, and lean protein foods contain the nutrients you need without too many calories. Decrease your  intake of foods high in solid fats, added sugars, and salt. Eat the right amount of calories for you. Get information about a proper diet from your health care provider, if necessary.  . Regular physical exercise is one of the most important things you can do for your health. Most adults should get at least 150 minutes of moderate-intensity exercise (any activity that increases your heart rate and causes you to sweat) each week. In addition, most adults need muscle-strengthening exercises on 2 or more days a week.  Silver Sneakers may be a benefit available to you. To determine eligibility, you may visit the website: www.silversneakers.com or contact program at 320-173-2577 Mon-Fri between 8AM-8PM.   . Maintain a healthy weight. The body mass index (BMI) is a screening tool to identify possible weight problems. It provides an estimate of body fat based on height and weight. Your health care provider can find your BMI and can help you achieve or maintain a healthy weight.   For adults 20 years and older: ? A BMI below 18.5 is considered underweight. ? A BMI of 18.5 to 24.9 is normal. ? A BMI of 25 to 29.9 is considered overweight. ? A BMI of 30 and above is considered obese.   . Maintain normal blood lipids and cholesterol levels by exercising and minimizing your intake of saturated fat. Eat a balanced diet with plenty of fruit and vegetables. Blood  tests for lipids and cholesterol should begin at age 51 and be repeated every 5 years. If your lipid or cholesterol levels are high, you are over 50, or you are at high risk for heart disease, you may need your cholesterol levels checked more frequently. Ongoing high lipid and cholesterol levels should be treated with medicines if diet and exercise are not working.  . If you smoke, find out from your health care provider how to quit. If you do not use tobacco, please do not start.  . If you choose to drink alcohol, please do not consume more than 2  drinks per day. One drink is considered to be 12 ounces (355 mL) of beer, 5 ounces (148 mL) of wine, or 1.5 ounces (44 mL) of liquor.  . If you are 49-67 years old, ask your health care provider if you should take aspirin to prevent strokes.  . Use sunscreen. Apply sunscreen liberally and repeatedly throughout the day. You should seek shade when your shadow is shorter than you. Protect yourself by wearing long sleeves, pants, a wide-brimmed hat, and sunglasses year round, whenever you are outdoors.  . Once a month, do a whole body skin exam, using a mirror to look at the skin on your back. Tell your health care provider of new moles, moles that have irregular borders, moles that are larger than a pencil eraser, or moles that have changed in shape or color.

## 2019-03-29 ENCOUNTER — Other Ambulatory Visit (INDEPENDENT_AMBULATORY_CARE_PROVIDER_SITE_OTHER): Payer: PPO

## 2019-03-29 DIAGNOSIS — Z0000 Encounter for general adult medical examination without abnormal findings: Secondary | ICD-10-CM

## 2019-03-29 LAB — LIPID PANEL
Cholesterol: 189 mg/dL (ref 0–200)
HDL: 64.2 mg/dL (ref >39.00)
LDL Cholesterol: 114 mg/dL — ABNORMAL HIGH (ref 0–99)
NonHDL: 124.65
Total CHOL/HDL Ratio: 3
Triglycerides: 55 mg/dL (ref 0.0–149.0)
VLDL: 11 mg/dL (ref 0.0–40.0)

## 2019-03-29 LAB — COMPREHENSIVE METABOLIC PANEL
ALT: 10 U/L (ref 0–35)
AST: 14 U/L (ref 0–37)
Albumin: 4.1 g/dL (ref 3.5–5.2)
Alkaline Phosphatase: 65 U/L (ref 39–117)
BUN: 13 mg/dL (ref 6–23)
CO2: 28 mEq/L (ref 19–32)
Calcium: 9.2 mg/dL (ref 8.4–10.5)
Chloride: 104 mEq/L (ref 96–112)
Creatinine, Ser: 0.93 mg/dL (ref 0.40–1.20)
GFR: 60.03 mL/min (ref >60.00)
Glucose, Bld: 118 mg/dL — ABNORMAL HIGH (ref 70–99)
Potassium: 3.9 mEq/L (ref 3.5–5.1)
Sodium: 140 mEq/L (ref 135–145)
Total Bilirubin: 0.4 mg/dL (ref 0.2–1.2)
Total Protein: 6.3 g/dL (ref 6.0–8.3)

## 2019-03-29 NOTE — Addendum Note (Signed)
Addended by: Eustace Pen on: 03/29/2019 01:15 PM   Modules accepted: Orders

## 2019-03-29 NOTE — Progress Notes (Signed)
Addendum created to amend mammogram order.

## 2019-03-29 NOTE — Progress Notes (Signed)
I reviewed health advisor's note, was available for consultation, and agree with documentation and plan.  

## 2019-03-31 ENCOUNTER — Encounter: Payer: Self-pay | Admitting: Primary Care

## 2019-03-31 ENCOUNTER — Ambulatory Visit (INDEPENDENT_AMBULATORY_CARE_PROVIDER_SITE_OTHER): Payer: PPO | Admitting: Primary Care

## 2019-03-31 ENCOUNTER — Other Ambulatory Visit: Payer: Self-pay

## 2019-03-31 VITALS — BP 122/74 | HR 79 | Temp 97.7°F | Ht 63.5 in | Wt 166.0 lb

## 2019-03-31 DIAGNOSIS — M549 Dorsalgia, unspecified: Secondary | ICD-10-CM

## 2019-03-31 DIAGNOSIS — Z23 Encounter for immunization: Secondary | ICD-10-CM

## 2019-03-31 DIAGNOSIS — J3089 Other allergic rhinitis: Secondary | ICD-10-CM | POA: Diagnosis not present

## 2019-03-31 DIAGNOSIS — R7303 Prediabetes: Secondary | ICD-10-CM | POA: Insufficient documentation

## 2019-03-31 DIAGNOSIS — R739 Hyperglycemia, unspecified: Secondary | ICD-10-CM | POA: Insufficient documentation

## 2019-03-31 DIAGNOSIS — F329 Major depressive disorder, single episode, unspecified: Secondary | ICD-10-CM

## 2019-03-31 DIAGNOSIS — G8929 Other chronic pain: Secondary | ICD-10-CM | POA: Diagnosis not present

## 2019-03-31 DIAGNOSIS — F419 Anxiety disorder, unspecified: Secondary | ICD-10-CM

## 2019-03-31 DIAGNOSIS — Z0000 Encounter for general adult medical examination without abnormal findings: Secondary | ICD-10-CM

## 2019-03-31 LAB — POCT GLYCOSYLATED HEMOGLOBIN (HGB A1C): Hemoglobin A1C: 5.7 % — AB (ref 4.0–5.6)

## 2019-03-31 MED ORDER — ONDANSETRON 4 MG PO TBDP: 4.0000 mg | ORAL_TABLET | Freq: Three times a day (TID) | ORAL | 0 refills | Status: DC | PRN

## 2019-03-31 MED ORDER — MONTELUKAST SODIUM 10 MG PO TABS: 10.0000 mg | ORAL_TABLET | Freq: Every day | ORAL | 3 refills | Status: DC

## 2019-03-31 MED ORDER — AZELASTINE HCL 0.1 % NA SOLN: 1.0000 | Freq: Two times a day (BID) | NASAL | 5 refills | Status: AC | PRN

## 2019-03-31 NOTE — Assessment & Plan Note (Signed)
Tetanus due, she will check with insurance regarding cough. Pneumovax due, provided today. Mammogram and bone density ordered and pending. Colonoscopy UTD, due in 2024. Exam stable. Labs reviewed. Follow up in 1 year for CPE.

## 2019-03-31 NOTE — Progress Notes (Signed)
Subjective:    Patient ID: Sharon Collins, female    DOB: 05/14/1951, 68 y.o.   MRN: [id number]  HPI  Sharon Collins is a 68 year old female who presents today for complete physical.  Immunizations: -Tetanus: Completed in 2010 -Influenza: Completes annually  -Pneumonia: Completed Prevnar in 2017 -Shingles: Completed in 2015   Diet: She endorses a healthy diet with vegetables, fruit, lean protein. She is doing take out food weekly, also some pizza. She is drinking mostly water, sweet tea, soda.  Exercise: She walking some, no recent exercise. Active in her home.   Eye exam: Completed in 2019 Dental exam: Completes annually  Colonoscopy: Completed in 2014, due in 2024 Mammogram: Ordered and pending Dexa: Ordered and pending Hep C Screen: Negative    Review of Systems  Constitutional: Negative for unexpected weight change.  HENT: Positive for postnasal drip and rhinorrhea.   Respiratory: Negative for cough and shortness of breath.   Cardiovascular: Negative for chest pain.  Gastrointestinal: Negative for constipation and diarrhea.  Genitourinary: Negative for difficulty urinating.  Musculoskeletal: Negative for arthralgias.       Chronic arthritis pain, no new pain  Skin: Negative for rash.  Allergic/Immunologic: Positive for environmental allergies.  Neurological: Negative for dizziness, numbness and headaches.  Psychiatric/Behavioral:       Feels well managed on current regimen.        Past Medical History:  Diagnosis Date  . Acute medial meniscus tear    left  . Allergic rhinitis   . Anxiety   . Anxiety and depression   . Asthma   . Atrophic vaginitis   . Cataracts, bilateral   . Cervical radiculopathy   . Insomnia   . Urinary incontinence      Social History   Socioeconomic History  . Marital status: Married    Spouse name: Not on file  . Number of children: Not on file  . Years of education: Not on file  . Highest education level: Not on file   Occupational History  . Not on file  Social Needs  . Financial resource strain: Not on file  . Food insecurity:    Worry: Not on file    Inability: Not on file  . Transportation needs:    Medical: Not on file    Non-medical: Not on file  Tobacco Use  . Smoking status: Never Smoker  . Smokeless tobacco: Never Used  Substance and Sexual Activity  . Alcohol use: No  . Drug use: No  . Sexual activity: Not on file  Lifestyle  . Physical activity:    Days per week: Not on file    Minutes per session: Not on file  . Stress: Not on file  Relationships  . Social connections:    Talks on phone: Not on file    Gets together: Not on file    Attends religious service: Not on file    Active member of club or organization: Not on file    Attends meetings of clubs or organizations: Not on file    Relationship status: Not on file  . Intimate partner violence:    Fear of current or ex partner: Not on file    Emotionally abused: Not on file    Physically abused: Not on file    Forced sexual activity: Not on file  Other Topics Concern  . Not on file  Social History Narrative   Married.   3 children, 11 grandchildren, 1 great grand  child.   Retired. Worked at [employer], [place], [place], [place].   Enjoys spending time with her family.    Past Surgical History:  Procedure Laterality Date  . BUNIONECTOMY    . CATARACT EXTRACTION Right 08/2017  . HAMMER TOE SURGERY    . LAMINECTOMY    . MANDIBLE FRACTURE SURGERY    . MANDIBLE FRACTURE SURGERY      Family History  Problem Relation Age of Onset  . Stroke Mother   . Hypertension Mother   . Thyroid disease Mother   . Cancer Father        Brain Tumor  . Hypertension Sister   . Thyroid disease Sister   . Hypertension Brother   . Mental illness Brother   . Thyroid disease Brother   . COPD Brother   . Heart disease Brother   . Cancer Maternal Aunt   . Cancer Maternal Grandmother   . Diabetes Neg Hx     Allergies   Allergen Reactions  . Codeine   . Hydrocodone Nausea And Vomiting    Vicodin causes vomiting, but when takes Phenergan 0.25 mg with it, she can tolerate it.  . Tramadol Nausea And Vomiting    Causes vomiting & affects her heart rhythm.    Current Outpatient Medications on File Prior to Visit  Medication Sig Dispense Refill  . albuterol (PROVENTIL HFA;VENTOLIN HFA) 108 (90 Base) MCG/ACT inhaler Inhale 2 puffs into the lungs every 6 (six) hours as needed for wheezing or shortness of breath. 1 Inhaler 12  . etodolac (LODINE) 500 MG tablet Take 1 tablet by mouth once daily as needed for pain. 90 tablet 0  . fexofenadine (ALLEGRA) 180 MG tablet Take 180 mg by mouth daily.    Marland Kitchen gabapentin (NEURONTIN) 300 MG capsule Take 600 mg by mouth 3 (three) times daily.     . hydrOXYzine (ATARAX/VISTARIL) 25 MG tablet     . lamoTRIgine (LAMICTAL) 100 MG tablet Take 1 tablet (100 mg total) by mouth daily. 90 tablet 0  . mometasone (NASONEX) 50 MCG/ACT nasal spray Instill 1 spray into each nostril twice daily. 17 g 3  . oxyCODONE-acetaminophen (PERCOCET/ROXICET) 5-325 MG tablet Take by mouth 2 (two) times daily as needed.    . sertraline (ZOLOFT) 100 MG tablet Take 1 tablet (100 mg total) by mouth daily. 90 tablet 0   No current facility-administered medications on file prior to visit.     BP 122/74   Pulse 79   Temp 97.7 F (36.5 C) (Tympanic)   Ht 5' 3.5" (1.613 m)   Wt 166 lb (75.3 kg)   LMP  (LMP Unknown)   SpO2 97%   BMI 28.94 kg/m    Objective:   Physical Exam  Constitutional: She is oriented to person, place, and time. She appears well-nourished.  HENT:  Mouth/Throat: No oropharyngeal exudate.  Eyes: Pupils are equal, round, and reactive to light. EOM are normal.  Neck: Neck supple.  Cardiovascular: Normal rate and regular rhythm.  Respiratory: Effort normal and breath sounds normal.  GI: Soft. Bowel sounds are normal. There is no abdominal tenderness.  Musculoskeletal: Normal range  of motion.  Neurological: She is alert and oriented to person, place, and time.  Skin: Skin is warm and dry.  Psychiatric: She has a normal mood and affect.           Assessment & Plan:

## 2019-03-31 NOTE — Assessment & Plan Note (Signed)
Following with psychiatry and is doing well overall.  Continue current regimen.

## 2019-03-31 NOTE — Patient Instructions (Addendum)
Stop by the lab prior to leaving today. I will notify you of your results once received.   Make sure to schedule your mammogram and bone density test.  Start exercising. You should be getting 150 minutes of moderate intensity exercise weekly.  It's important to improve your diet by reducing consumption of fast food, fried food, processed snack foods, sugary drinks. Increase consumption of fresh vegetables and fruits, whole grains, water.  Ensure you are drinking 64 ounces of water daily.  It was a pleasure to see you today!   Preventive Care 26 Years and Older, Female Preventive care refers to lifestyle choices and visits with your health care provider that can promote health and wellness. What does preventive care include?  A yearly physical exam. This is also called an annual well check.  Dental exams once or twice a year.  Routine eye exams. Ask your health care provider how often you should have your eyes checked.  Personal lifestyle choices, including: ? Daily care of your teeth and gums. ? Regular physical activity. ? Eating a healthy diet. ? Avoiding tobacco and drug use. ? Limiting alcohol use. ? Practicing safe sex. ? Taking low-dose aspirin every day. ? Taking vitamin and mineral supplements as recommended by your health care provider. What happens during an annual well check? The services and screenings done by your health care provider during your annual well check will depend on your age, overall health, lifestyle risk factors, and family history of disease. Counseling Your health care provider may ask you questions about your:  Alcohol use.  Tobacco use.  Drug use.  Emotional well-being.  Home and relationship well-being.  Sexual activity.  Eating habits.  History of falls.  Memory and ability to understand (cognition).  Work and work Statistician.  Reproductive health.  Screening You may have the following tests or measurements:  Height,  weight, and BMI.  Blood pressure.  Lipid and cholesterol levels. These may be checked every 5 years, or more frequently if you are over 68 years old.  Skin check.  Lung cancer screening. You may have this screening every year starting at age 68 if you have a 30-pack-year history of smoking and currently smoke or have quit within the past 15 years. and currently smoke or have quit within the past 15 years.  Colorectal cancer screening. All adults should have this screening starting at age 68 and continuing until age 4.. You will have tests every 1-10 years, depending on your results and the type of screening test. People at increased risk should start screening at an earlier age. Screening tests may include: ? Guaiac-based fecal occult blood testing. ? Fecal immunochemical test (FIT). ? Stool DNA test. ? Virtual colonoscopy. ? Sigmoidoscopy. During this test, a flexible tube with a tiny camera (sigmoidoscope) is used to examine your rectum and lower colon. The sigmoidoscope is inserted through your anus into your rectum and lower colon. ? Colonoscopy. During this test, a long, thin, flexible tube with a tiny camera (colonoscope) is used to examine your entire colon and rectum.  Hepatitis C blood test.  Hepatitis B blood test.  Sexually transmitted disease (STD) testing.  Diabetes screening. This is done by checking your blood sugar (glucose) after you have not eaten for a while (fasting). You may have this done every 1-3 years.  Bone density scan. This is done to screen for osteoporosis. You may have this done starting at age 68.  Mammogram. This may be done every 1-2 years. Talk to your health care provider about how often you should have  regular mammograms. Talk with your health care provider about your test results, treatment options, and if necessary, the need for more tests. Vaccines Your health care provider may recommend certain vaccines, such as:  Influenza vaccine. This is recommended every year.  Tetanus, diphtheria, and acellular  pertussis (Tdap, Td) vaccine. You may need a Td booster every 10 years.  Varicella vaccine. You may need this if you have not been vaccinated.  Zoster vaccine. You may need this after age 68.  Measles, mumps, and rubella (MMR) vaccine. You may need at least one dose of MMR if you were born in 1957 or later. You may also need a second dose.  Pneumococcal 13-valent conjugate (PCV13) vaccine. One dose is recommended after age 68.  Pneumococcal polysaccharide (PPSV23) vaccine. One dose is recommended after age 68.  Meningococcal vaccine. You may need this if you have certain conditions.  Hepatitis A vaccine. You may need this if you have certain conditions or if you travel or work in places where you may be exposed to hepatitis A.  Hepatitis B vaccine. You may need this if you have certain conditions or if you travel or work in places where you may be exposed to hepatitis B.  Haemophilus influenzae type b (Hib) vaccine. You may need this if you have certain conditions. Talk to your health care provider about which screenings and vaccines you need and how often you need them. This information is not intended to replace advice given to you by your health care provider. Make sure you discuss any questions you have with your health care provider. Document Released: 11/08/2015 Document Revised: 12/02/2017 Document Reviewed: 08/13/2015 Elsevier Interactive Patient Education  2019 [employer].

## 2019-03-31 NOTE — Assessment & Plan Note (Addendum)
Chronic, worse during seasonal changes. Continue Allegra, Nasonex. Astelin, Singulair.

## 2019-03-31 NOTE — Assessment & Plan Note (Signed)
Noted on recent labs, suspect that she was not fasting. A1C pending.

## 2019-03-31 NOTE — Assessment & Plan Note (Addendum)
Chronic and following with pain management through [facility]. Will undergo lumbar spine injection this Fall. Taking Percocet PRN, sometimes twice daily, sometimes none during the week. She will also experience nausea when taking Percocet, would like a refill of her Zofran. Refill provided and discussed to use sparingly. Continue current regimen.

## 2019-04-04 ENCOUNTER — Encounter: Payer: PPO | Admitting: Primary Care

## 2019-05-15 ENCOUNTER — Other Ambulatory Visit: Payer: PPO

## 2019-05-19 DIAGNOSIS — M542 Cervicalgia: Secondary | ICD-10-CM | POA: Diagnosis not present

## 2019-05-19 DIAGNOSIS — G8929 Other chronic pain: Secondary | ICD-10-CM | POA: Diagnosis not present

## 2019-05-19 DIAGNOSIS — M533 Sacrococcygeal disorders, not elsewhere classified: Secondary | ICD-10-CM | POA: Diagnosis not present

## 2019-05-19 DIAGNOSIS — M961 Postlaminectomy syndrome, not elsewhere classified: Secondary | ICD-10-CM | POA: Diagnosis not present

## 2019-05-19 DIAGNOSIS — M47816 Spondylosis without myelopathy or radiculopathy, lumbar region: Secondary | ICD-10-CM | POA: Diagnosis not present

## 2019-05-26 DIAGNOSIS — F5105 Insomnia due to other mental disorder: Secondary | ICD-10-CM | POA: Diagnosis not present

## 2019-05-26 DIAGNOSIS — F319 Bipolar disorder, unspecified: Secondary | ICD-10-CM | POA: Diagnosis not present

## 2019-06-22 ENCOUNTER — Ambulatory Visit
Admission: RE | Admit: 2019-06-22 | Discharge: 2019-06-22 | Disposition: A | Discharge status: Home / Self Care | Payer: PPO | Source: Ambulatory Visit | Attending: Primary Care | Admitting: Primary Care

## 2019-06-22 DIAGNOSIS — Z1239 Encounter for other screening for malignant neoplasm of breast: Secondary | ICD-10-CM

## 2019-06-22 DIAGNOSIS — M8589 Other specified disorders of bone density and structure, multiple sites: Secondary | ICD-10-CM | POA: Diagnosis not present

## 2019-06-22 DIAGNOSIS — Z1231 Encounter for screening mammogram for malignant neoplasm of breast: Secondary | ICD-10-CM | POA: Insufficient documentation

## 2019-06-22 DIAGNOSIS — E2839 Other primary ovarian failure: Secondary | ICD-10-CM | POA: Diagnosis not present

## 2019-06-22 DIAGNOSIS — Z780 Asymptomatic menopausal state: Secondary | ICD-10-CM | POA: Diagnosis not present

## 2019-06-23 ENCOUNTER — Encounter: Payer: Self-pay | Admitting: *Deleted

## 2019-07-05 ENCOUNTER — Ambulatory Visit: Payer: PPO | Admitting: Family Medicine

## 2019-07-07 ENCOUNTER — Other Ambulatory Visit: Payer: Self-pay

## 2019-07-07 ENCOUNTER — Ambulatory Visit (INDEPENDENT_AMBULATORY_CARE_PROVIDER_SITE_OTHER): Payer: PPO | Admitting: Family Medicine

## 2019-07-07 ENCOUNTER — Encounter: Payer: Self-pay | Admitting: Family Medicine

## 2019-07-07 VITALS — BP 134/82 | HR 69 | Temp 96.5°F | Ht 63.5 in | Wt 163.1 lb

## 2019-07-07 DIAGNOSIS — Z23 Encounter for immunization: Secondary | ICD-10-CM

## 2019-07-07 DIAGNOSIS — G8929 Other chronic pain: Secondary | ICD-10-CM | POA: Diagnosis not present

## 2019-07-07 DIAGNOSIS — M549 Dorsalgia, unspecified: Secondary | ICD-10-CM

## 2019-07-07 NOTE — Progress Notes (Signed)
Subjective:    Patient ID: Sharon Collins, female    DOB: [birth year], 68 y.o.   MRN: [id number]  HPI 68 yo pt of NP Clark here for hip pain   Flu shot today also  Goes to spine/pain clinic in [place]  Has had injections in the past (last July 24th) Last one on the left "did not work"  Her L calf and leg are really throbbing -last 2 d She is overdue for a follow up   She has deg disc dz One "rupture"- had a surgery once - about 10 y ago   Pain goes from the L low back to arch of her foot  Takes oxycodone for pain -just when she really needs it - bid for the last week  Has etodolac- takes one in between every once in a while   Course of prednisone never helps   Patient Active Problem List   Diagnosis Date Noted  . Hyperglycemia 03/31/2019  . Flank pain 09/21/2018  . Lower abdominal pain 09/21/2018  . Preventative health care 01/18/2018  . Anxiety and depression 11/23/2016  . Allergic rhinitis 11/23/2016  . Chronic back pain 11/23/2016   Past Medical History:  Diagnosis Date  . Acute medial meniscus tear    left  . Allergic rhinitis   . Anxiety   . Anxiety and depression   . Asthma   . Atrophic vaginitis   . Cataracts, bilateral   . Cervical radiculopathy   . Insomnia   . Urinary incontinence    Past Surgical History:  Procedure Laterality Date  . BUNIONECTOMY    . CATARACT EXTRACTION Right 08/2017  . HAMMER TOE SURGERY    . LAMINECTOMY    . MANDIBLE FRACTURE SURGERY    . MANDIBLE FRACTURE SURGERY     Social History   Tobacco Use  . Smoking status: Never Smoker  . Smokeless tobacco: Never Used  Substance Use Topics  . Alcohol use: No  . Drug use: No   Family History  Problem Relation Age of Onset  . Stroke Mother   . Hypertension Mother   . Thyroid disease Mother   . Cancer Father        Brain Tumor  . Hypertension Sister   . Thyroid disease Sister   . Hypertension Brother   . Mental illness Brother   . Thyroid disease Brother   .  COPD Brother   . Heart disease Brother   . Cancer Maternal Aunt   . Cancer Maternal Grandmother   . Diabetes Neg Hx    Allergies  Allergen Reactions  . Codeine   . Hydrocodone Nausea And Vomiting    Vicodin causes vomiting, but when takes Phenergan 0.25 mg with it, she can tolerate it.  . Tramadol Nausea And Vomiting    Causes vomiting & affects her heart rhythm.   Current Outpatient Medications on File Prior to Visit  Medication Sig Dispense Refill  . albuterol (PROVENTIL HFA;VENTOLIN HFA) 108 (90 Base) MCG/ACT inhaler Inhale 2 puffs into the lungs every 6 (six) hours as needed for wheezing or shortness of breath. 1 Inhaler 12  . azelastine (ASTELIN) 0.1 % nasal spray Place 1 spray into both nostrils 2 (two) times daily as needed for rhinitis. 30 mL 5  . etodolac (LODINE) 500 MG tablet Take 1 tablet by mouth once daily as needed for pain. 90 tablet 0  . fexofenadine (ALLEGRA) 180 MG tablet Take 180 mg by mouth daily.    Marland Kitchen  gabapentin (NEURONTIN) 300 MG capsule Take 600 mg by mouth 3 (three) times daily.     . hydrOXYzine (ATARAX/VISTARIL) 25 MG tablet     . lamoTRIgine (LAMICTAL) 100 MG tablet Take 1 tablet (100 mg total) by mouth daily. 90 tablet 0  . montelukast (SINGULAIR) 10 MG tablet Take 1 tablet (10 mg total) by mouth at bedtime. For allergies. 90 tablet 3  . ondansetron (ZOFRAN-ODT) 4 MG disintegrating tablet Take 1 tablet (4 mg total) by mouth every 8 (eight) hours as needed for nausea or vomiting. 90 tablet 0  . oxyCODONE-acetaminophen (PERCOCET/ROXICET) 5-325 MG tablet Take by mouth 2 (two) times daily as needed.    Marland Kitchen QUEtiapine (SEROQUEL) 25 MG tablet Take 1 tablet by mouth daily.    . sertraline (ZOLOFT) 100 MG tablet Take 1 tablet (100 mg total) by mouth daily. 90 tablet 0   No current facility-administered medications on file prior to visit.     Review of Systems  Constitutional: Negative for activity change, appetite change, fatigue, fever and unexpected weight change.   HENT: Negative for congestion, ear pain, rhinorrhea, sinus pressure and sore throat.   Eyes: Negative for pain, redness and visual disturbance.  Respiratory: Negative for cough, shortness of breath and wheezing.   Cardiovascular: Negative for chest pain and palpitations.  Gastrointestinal: Negative for abdominal pain, blood in stool, constipation and diarrhea.  Endocrine: Negative for polydipsia and polyuria.  Genitourinary: Negative for dysuria, frequency and urgency.  Musculoskeletal: Positive for back pain. Negative for arthralgias and myalgias.  Skin: Negative for pallor and rash.  Allergic/Immunologic: Negative for environmental allergies.  Neurological: Negative for dizziness, syncope, weakness, numbness and headaches.  Hematological: Negative for adenopathy. Does not bruise/bleed easily.  Psychiatric/Behavioral: Negative for decreased concentration and dysphoric mood. The patient is not nervous/anxious.        Objective:   Physical Exam Constitutional:      General: She is not in acute distress.    Appearance: Normal appearance. She is well-developed. She is not ill-appearing.     Comments: Overweight   HENT:     Head: Normocephalic and atraumatic.     Mouth/Throat:     Mouth: Mucous membranes are moist.  Eyes:     General: No scleral icterus.    Conjunctiva/sclera: Conjunctivae normal.     Pupils: Pupils are equal, round, and reactive to light.  Neck:     Musculoskeletal: Normal range of motion and neck supple.  Cardiovascular:     Rate and Rhythm: Normal rate and regular rhythm.  Pulmonary:     Effort: Pulmonary effort is normal.     Breath sounds: Normal breath sounds. No wheezing or rales.  Abdominal:     General: Bowel sounds are normal. There is no distension.     Palpations: Abdomen is soft.     Tenderness: There is no abdominal tenderness.  Musculoskeletal:        General: Tenderness present.     Right shoulder: She exhibits decreased range of motion,  tenderness and spasm. She exhibits no bony tenderness, no crepitus, no deformity and normal strength.     Lumbar back: She exhibits decreased range of motion, tenderness and spasm. She exhibits no bony tenderness and no edema.     Comments: Tender over L lower lumbar musculature  Nl rom of hip  Limited ext of LS -painful after 10 deg  No neuro changes  LLE- no swelling/pitting/erythema/warmth or palp cords  Lymphadenopathy:     Cervical: No cervical adenopathy.  Skin:    General: Skin is warm and dry.     Coloration: Skin is not pale.     Findings: No erythema or rash.  Neurological:     General: No focal deficit present.     Mental Status: She is alert.     Cranial Nerves: No cranial nerve deficit.     Sensory: No sensory deficit.     Motor: No weakness, atrophy or abnormal muscle tone.     Coordination: Coordination normal.     Deep Tendon Reflexes: Reflexes are normal and symmetric. Reflexes normal.     Comments: SLR on L causes leg pain   Psychiatric:        Mood and Affect: Mood normal.           Assessment & Plan:   Problem List Items Addressed This Visit      Other   Chronic back pain    Worse radiculopathy symptoms  Reassuring exam w/o neuro compromise  Disc red flags for disc herniation  Doing her PT scratches  Has oxycodone at home as well as lodine Will try heat and ice inst to call and get appt with her spine specialist         Other Visit Diagnoses    Need for influenza vaccination    -  Primary   Relevant Orders   Flu Vaccine QUAD High Dose(Fluad) (Completed)

## 2019-07-07 NOTE — Patient Instructions (Addendum)
Call and get an appointment at your spine center as soon as you can   Continue current medicines If you are suddenly unable to use your foot/lift your toes please let us know and get to the ER if necessary   If you develop redness or swelling of the leg let me know    Flu shot today

## 2019-07-08 NOTE — Assessment & Plan Note (Signed)
Worse radiculopathy symptoms  Reassuring exam w/o neuro compromise  Disc red flags for disc herniation  Doing her PT scratches  Has oxycodone at home as well as lodine Will try heat and ice inst to call and get appt with her spine specialist

## 2019-07-25 DIAGNOSIS — M961 Postlaminectomy syndrome, not elsewhere classified: Secondary | ICD-10-CM | POA: Diagnosis not present

## 2019-07-25 DIAGNOSIS — M4722 Other spondylosis with radiculopathy, cervical region: Secondary | ICD-10-CM | POA: Diagnosis not present

## 2019-07-25 DIAGNOSIS — M533 Sacrococcygeal disorders, not elsewhere classified: Secondary | ICD-10-CM | POA: Diagnosis not present

## 2019-07-25 DIAGNOSIS — G894 Chronic pain syndrome: Secondary | ICD-10-CM | POA: Diagnosis not present

## 2019-07-25 DIAGNOSIS — Z0289 Encounter for other administrative examinations: Secondary | ICD-10-CM | POA: Insufficient documentation

## 2019-07-25 DIAGNOSIS — G8929 Other chronic pain: Secondary | ICD-10-CM | POA: Diagnosis not present

## 2019-07-25 DIAGNOSIS — M25562 Pain in left knee: Secondary | ICD-10-CM | POA: Diagnosis not present

## 2019-07-25 DIAGNOSIS — M47816 Spondylosis without myelopathy or radiculopathy, lumbar region: Secondary | ICD-10-CM | POA: Diagnosis not present

## 2019-07-26 DIAGNOSIS — F5105 Insomnia due to other mental disorder: Secondary | ICD-10-CM | POA: Diagnosis not present

## 2019-07-26 DIAGNOSIS — F319 Bipolar disorder, unspecified: Secondary | ICD-10-CM | POA: Diagnosis not present

## 2019-08-25 DIAGNOSIS — J3089 Other allergic rhinitis: Secondary | ICD-10-CM | POA: Diagnosis not present

## 2019-08-25 DIAGNOSIS — J301 Allergic rhinitis due to pollen: Secondary | ICD-10-CM | POA: Diagnosis not present

## 2019-08-25 DIAGNOSIS — R05 Cough: Secondary | ICD-10-CM | POA: Diagnosis not present

## 2019-08-25 DIAGNOSIS — J3081 Allergic rhinitis due to animal (cat) (dog) hair and dander: Secondary | ICD-10-CM | POA: Diagnosis not present

## 2019-09-13 DIAGNOSIS — F319 Bipolar disorder, unspecified: Secondary | ICD-10-CM | POA: Diagnosis not present

## 2019-09-13 DIAGNOSIS — F5105 Insomnia due to other mental disorder: Secondary | ICD-10-CM | POA: Diagnosis not present

## 2019-10-09 DIAGNOSIS — M79671 Pain in right foot: Secondary | ICD-10-CM | POA: Diagnosis not present

## 2019-10-09 DIAGNOSIS — M25562 Pain in left knee: Secondary | ICD-10-CM | POA: Diagnosis not present

## 2019-10-09 DIAGNOSIS — M1712 Unilateral primary osteoarthritis, left knee: Secondary | ICD-10-CM | POA: Diagnosis not present

## 2019-10-10 DIAGNOSIS — M533 Sacrococcygeal disorders, not elsewhere classified: Secondary | ICD-10-CM | POA: Diagnosis not present

## 2019-10-10 DIAGNOSIS — M961 Postlaminectomy syndrome, not elsewhere classified: Secondary | ICD-10-CM | POA: Diagnosis not present

## 2019-10-10 DIAGNOSIS — G894 Chronic pain syndrome: Secondary | ICD-10-CM | POA: Diagnosis not present

## 2019-10-10 DIAGNOSIS — M25562 Pain in left knee: Secondary | ICD-10-CM | POA: Diagnosis not present

## 2019-10-10 DIAGNOSIS — M4722 Other spondylosis with radiculopathy, cervical region: Secondary | ICD-10-CM | POA: Diagnosis not present

## 2019-10-10 DIAGNOSIS — G8929 Other chronic pain: Secondary | ICD-10-CM | POA: Diagnosis not present

## 2019-10-10 DIAGNOSIS — M47816 Spondylosis without myelopathy or radiculopathy, lumbar region: Secondary | ICD-10-CM | POA: Diagnosis not present

## 2019-10-11 DIAGNOSIS — M533 Sacrococcygeal disorders, not elsewhere classified: Secondary | ICD-10-CM | POA: Diagnosis not present

## 2019-10-19 ENCOUNTER — Other Ambulatory Visit: Payer: Self-pay

## 2019-10-19 ENCOUNTER — Encounter: Payer: Self-pay | Admitting: Primary Care

## 2019-10-19 ENCOUNTER — Ambulatory Visit (INDEPENDENT_AMBULATORY_CARE_PROVIDER_SITE_OTHER): Payer: PPO | Admitting: Primary Care

## 2019-10-19 VITALS — BP 130/80 | HR 82 | Temp 97.6°F | Ht 63.5 in | Wt 157.8 lb

## 2019-10-19 DIAGNOSIS — R109 Unspecified abdominal pain: Secondary | ICD-10-CM

## 2019-10-19 LAB — POC URINALSYSI DIPSTICK (AUTOMATED)
Bilirubin, UA: NEGATIVE
Blood, UA: NEGATIVE
Glucose, UA: NEGATIVE
Ketones, UA: NEGATIVE
Leukocytes, UA: NEGATIVE
Nitrite, UA: NEGATIVE
Protein, UA: NEGATIVE
Spec Grav, UA: >=1.03 — AB (ref 1.010–1.025)
Urobilinogen, UA: 0.2 E.U./dL
pH, UA: 5 (ref 5.0–8.0)

## 2019-10-19 NOTE — Progress Notes (Signed)
Subjective:    Patient ID: Sharon Collins, female    DOB: November 01, 1950, 68 y.o.   MRN: [id number]  HPI  Sharon Collins is a 68 year old female with a history of UTI (E coli), anxiety and depression, chronic low back pain who presents today with a chief complaint of flank pain.  Her flank radiates to bilateral flank, also with one day of hematuria, and slight increase in urinary frequency. She denies fevers, abdominal pain, vaginal discharge/itching.   She had a spinal injection yesterday and shortly after these symptoms began.   She was referred to Urology in late 2019 due to ongoing urinary symptoms without evidence of UTI and negative renal stone CT. She had an appointment scheduled which was cancelled due to Covid-19. She has not yet called back.  Review of Systems  Gastrointestinal: Negative for abdominal pain.  Genitourinary: Positive for flank pain and frequency. Negative for dysuria, pelvic pain and vaginal discharge.  Musculoskeletal: Positive for back pain.       Past Medical History:  Diagnosis Date  . Acute medial meniscus tear    left  . Allergic rhinitis   . Anxiety   . Anxiety and depression   . Asthma   . Atrophic vaginitis   . Cataracts, bilateral   . Cervical radiculopathy   . Insomnia   . Urinary incontinence      Social History   Socioeconomic History  . Marital status: Married    Spouse name: Not on file  . Number of children: Not on file  . Years of education: Not on file  . Highest education level: Not on file  Occupational History  . Not on file  Tobacco Use  . Smoking status: Never Smoker  . Smokeless tobacco: Never Used  Substance and Sexual Activity  . Alcohol use: No  . Drug use: No  . Sexual activity: Not on file  Other Topics Concern  . Not on file  Social History Narrative   Married.   3 children, 11 grandchildren, 1 great grand child.   Retired. Worked at [employer], [place], [place], [place].   Enjoys spending time with her  family.   Social Determinants of Health   Financial Resource Strain:   . Difficulty of Paying Living Expenses: Not on file  Food Insecurity:   . Worried About Charity fundraiser in the Last Year: Not on file  . Ran Out of Food in the Last Year: Not on file  Transportation Needs:   . Lack of Transportation (Medical): Not on file  . Lack of Transportation (Non-Medical): Not on file  Physical Activity:   . Days of Exercise per Week: Not on file  . Minutes of Exercise per Session: Not on file  Stress:   . Feeling of Stress : Not on file  Social Connections:   . Frequency of Communication with Friends and Family: Not on file  . Frequency of Social Gatherings with Friends and Family: Not on file  . Attends Religious Services: Not on file  . Active Member of Clubs or Organizations: Not on file  . Attends Archivist Meetings: Not on file  . Marital Status: Not on file  Intimate Partner Violence:   . Fear of Current or Ex-Partner: Not on file  . Emotionally Abused: Not on file  . Physically Abused: Not on file  . Sexually Abused: Not on file    Past Surgical History:  Procedure Laterality Date  . BUNIONECTOMY    .  CATARACT EXTRACTION Right 08/2017  . HAMMER TOE SURGERY    . LAMINECTOMY    . MANDIBLE FRACTURE SURGERY    . MANDIBLE FRACTURE SURGERY      Family History  Problem Relation Age of Onset  . Stroke Mother   . Hypertension Mother   . Thyroid disease Mother   . Cancer Father        Brain Tumor  . Hypertension Sister   . Thyroid disease Sister   . Hypertension Brother   . Mental illness Brother   . Thyroid disease Brother   . COPD Brother   . Heart disease Brother   . Cancer Maternal Aunt   . Cancer Maternal Grandmother   . Diabetes Neg Hx     Allergies  Allergen Reactions  . Codeine   . Hydrocodone Nausea And Vomiting    Vicodin causes vomiting, but when takes Phenergan 0.25 mg with it, she can tolerate it.  . Tramadol Nausea And Vomiting     Causes vomiting & affects her heart rhythm.    Current Outpatient Medications on File Prior to Visit  Medication Sig Dispense Refill  . albuterol (PROVENTIL HFA;VENTOLIN HFA) 108 (90 Base) MCG/ACT inhaler Inhale 2 puffs into the lungs every 6 (six) hours as needed for wheezing or shortness of breath. 1 Inhaler 12  . azelastine (ASTELIN) 0.1 % nasal spray Place 1 spray into both nostrils 2 (two) times daily as needed for rhinitis. 30 mL 5  . etodolac (LODINE) 500 MG tablet Take 1 tablet by mouth once daily as needed for pain. 90 tablet 0  . fexofenadine (ALLEGRA) 180 MG tablet Take 180 mg by mouth daily.    Marland Kitchen gabapentin (NEURONTIN) 300 MG capsule Take 600 mg by mouth 3 (three) times daily.     . hydrOXYzine (ATARAX/VISTARIL) 25 MG tablet     . lamoTRIgine (LAMICTAL) 100 MG tablet Take 1 tablet (100 mg total) by mouth daily. 90 tablet 0  . montelukast (SINGULAIR) 10 MG tablet Take 1 tablet (10 mg total) by mouth at bedtime. For allergies. 90 tablet 3  . ondansetron (ZOFRAN-ODT) 4 MG disintegrating tablet Take 1 tablet (4 mg total) by mouth every 8 (eight) hours as needed for nausea or vomiting. 90 tablet 0  . oxyCODONE-acetaminophen (PERCOCET/ROXICET) 5-325 MG tablet Take by mouth 2 (two) times daily as needed.    Marland Kitchen QUEtiapine (SEROQUEL) 25 MG tablet Take 1 tablet by mouth daily.    . sertraline (ZOLOFT) 100 MG tablet Take 1 tablet (100 mg total) by mouth daily. 90 tablet 0   No current facility-administered medications on file prior to visit.    LMP  (LMP Unknown)    Objective:   Physical Exam  Constitutional: She appears well-nourished.  Cardiovascular: Normal rate and regular rhythm.  Respiratory: Effort normal and breath sounds normal.  GI: Soft. Normal appearance. There is no CVA tenderness.  Musculoskeletal:     Cervical back: Neck supple.     Comments: Decrease in ROM with pain with flexion and extension  Skin: Skin is warm and dry.  Psychiatric: She has a normal mood and  affect.           Assessment & Plan:

## 2019-10-19 NOTE — Assessment & Plan Note (Addendum)
Acute for the last 24 hours which began after lumbar spine injection.   UA today negative.  Suspect symptoms are more MSK related given recent injection and negative UA. Also with pain with ROM. Discussed ice/heat, topical analgesics, stretching.  Culture sent to be sure.  She will update in a few days if anything changes. Referral placed back to Urology for ongoing symptoms.

## 2019-10-19 NOTE — Patient Instructions (Signed)
Apply ice/heat to your back for symptoms.  You can apply topical creams (purchased over the counter) to the area of pain.   We will be in touch once we receive your urine culture results.  It was a pleasure to see you today!

## 2019-10-20 LAB — URINE CULTURE
MICRO NUMBER:: 1230358
Result:: NO GROWTH
SPECIMEN QUALITY:: ADEQUATE

## 2019-10-27 ENCOUNTER — Other Ambulatory Visit: Payer: Self-pay

## 2019-10-27 ENCOUNTER — Emergency Department: Payer: PPO

## 2019-10-27 ENCOUNTER — Encounter: Payer: Self-pay | Admitting: Intensive Care

## 2019-10-27 ENCOUNTER — Emergency Department
Admission: EM | Admit: 2019-10-27 | Discharge: 2019-10-27 | Disposition: A | Discharge status: Home / Self Care | Payer: PPO | Attending: Emergency Medicine | Admitting: Emergency Medicine

## 2019-10-27 DIAGNOSIS — R509 Fever, unspecified: Secondary | ICD-10-CM | POA: Insufficient documentation

## 2019-10-27 DIAGNOSIS — Z20822 Contact with and (suspected) exposure to covid-19: Secondary | ICD-10-CM | POA: Insufficient documentation

## 2019-10-27 DIAGNOSIS — R05 Cough: Secondary | ICD-10-CM | POA: Insufficient documentation

## 2019-10-27 DIAGNOSIS — R0789 Other chest pain: Secondary | ICD-10-CM | POA: Diagnosis not present

## 2019-10-27 DIAGNOSIS — R079 Chest pain, unspecified: Secondary | ICD-10-CM | POA: Diagnosis not present

## 2019-10-27 DIAGNOSIS — Z79899 Other long term (current) drug therapy: Secondary | ICD-10-CM | POA: Diagnosis not present

## 2019-10-27 DIAGNOSIS — J45909 Unspecified asthma, uncomplicated: Secondary | ICD-10-CM | POA: Insufficient documentation

## 2019-10-27 HISTORY — DX: Other intervertebral disc degeneration, lumbar region without mention of lumbar back pain or lower extremity pain: M51.369

## 2019-10-27 HISTORY — DX: Other intervertebral disc degeneration, lumbar region: M51.36

## 2019-10-27 LAB — BASIC METABOLIC PANEL
Anion gap: 8 (ref 5–15)
BUN: 15 mg/dL (ref 8–23)
CO2: 24 mmol/L (ref 22–32)
Calcium: 9.2 mg/dL (ref 8.9–10.3)
Chloride: 106 mmol/L (ref 98–111)
Creatinine, Ser: 0.72 mg/dL (ref 0.44–1.00)
GFR calc Af Amer: >60 mL/min (ref >60)
GFR calc non Af Amer: >60 mL/min (ref >60)
Glucose, Bld: 94 mg/dL (ref 70–99)
Potassium: 3.9 mmol/L (ref 3.5–5.1)
Sodium: 138 mmol/L (ref 135–145)

## 2019-10-27 LAB — CBC
HCT: 39 % (ref 36.0–46.0)
Hemoglobin: 12.9 g/dL (ref 12.0–15.0)
MCH: 26 pg (ref 26.0–34.0)
MCHC: 33.1 g/dL (ref 30.0–36.0)
MCV: 78.5 fL — ABNORMAL LOW (ref 80.0–100.0)
Platelets: 274 10*3/uL (ref 150–400)
RBC: 4.97 MIL/uL (ref 3.87–5.11)
RDW: 14.8 % (ref 11.5–15.5)
WBC: 7.3 10*3/uL (ref 4.0–10.5)
nRBC: 0 % (ref 0.0–0.2)

## 2019-10-27 LAB — POC SARS CORONAVIRUS 2 AG -  ED: SARS Coronavirus 2 Ag: NEGATIVE

## 2019-10-27 LAB — TROPONIN I (HIGH SENSITIVITY): Troponin I (High Sensitivity): <2 ng/L (ref <18)

## 2019-10-27 MED ORDER — ONDANSETRON HCL 4 MG PO TABS: 4.0000 mg | ORAL_TABLET | Freq: Three times a day (TID) | ORAL | 0 refills | Status: DC | PRN

## 2019-10-27 MED ORDER — BENZONATATE 100 MG PO CAPS: 100.0000 mg | ORAL_CAPSULE | Freq: Four times a day (QID) | ORAL | 0 refills | Status: DC | PRN

## 2019-10-27 NOTE — ED Triage Notes (Signed)
Patient c/o cough, chills/sweats, and chest pain

## 2019-10-27 NOTE — ED Provider Notes (Signed)
[facility] Emergency Department Provider Note  ____________________________________________   I have reviewed the triage vital signs and the nursing notes.   HISTORY  Chief Complaint Chills, Cough, and Chest Pain   History limited by: Not Limited   HPI Sharon Collins is a 69 y.o. female who presents to the emergency department today because of concerns for possible Covid.  Patient states for the past couple of days she has been having fevers and chills, cough, chest discomfort.  Patient is not know of any known sick contacts.  She has taken Tylenol for the fevers last took it a few hours before arrival.  Patient does have a history of asthma.   Records reviewed. Per medical record review patient has a history of asthma.   Past Medical History:  Diagnosis Date  . Acute medial meniscus tear    left  . Allergic rhinitis   . Anxiety   . Anxiety and depression   . Asthma   . Atrophic vaginitis   . Cataracts, bilateral   . Cervical radiculopathy   . Degenerative disc disease, lumbar   . Insomnia   . Urinary incontinence     Patient Active Problem List   Diagnosis Date Noted  . Hyperglycemia 03/31/2019  . Flank pain 09/21/2018  . Lower abdominal pain 09/21/2018  . Preventative health care 01/18/2018  . Anxiety and depression 11/23/2016  . Allergic rhinitis 11/23/2016  . Chronic back pain 11/23/2016    Past Surgical History:  Procedure Laterality Date  . BUNIONECTOMY    . CATARACT EXTRACTION Right 08/2017  . HAMMER TOE SURGERY    . LAMINECTOMY    . MANDIBLE FRACTURE SURGERY    . MANDIBLE FRACTURE SURGERY      Prior to Admission medications   Medication Sig Start Date End Date Taking? Authorizing Provider  albuterol (PROVENTIL HFA;VENTOLIN HFA) 108 (90 Base) MCG/ACT inhaler Inhale 2 puffs into the lungs every 6 (six) hours as needed for wheezing or shortness of breath. 09/22/16   Volney American, PA-C  azelastine (ASTELIN) 0.1 %  nasal spray Place 1 spray into both nostrils 2 (two) times daily as needed for rhinitis. 03/31/19   [name], NP  etodolac (LODINE) 500 MG tablet Take 1 tablet by mouth once daily as needed for pain. 07/22/18   [name], NP  fexofenadine (ALLEGRA) 180 MG tablet Take 180 mg by mouth daily.    [provider]  gabapentin (NEURONTIN) 300 MG capsule Take 600 mg by mouth 3 (three) times daily.     [provider]  hydrOXYzine (ATARAX/VISTARIL) 25 MG tablet  03/27/19   [provider]  lamoTRIgine (LAMICTAL) 100 MG tablet Take 1 tablet (100 mg total) by mouth daily. 11/23/16   [name], NP  montelukast (SINGULAIR) 10 MG tablet Take 1 tablet (10 mg total) by mouth at bedtime. For allergies. 03/31/19   [name], NP  ondansetron (ZOFRAN-ODT) 4 MG disintegrating tablet Take 1 tablet (4 mg total) by mouth every 8 (eight) hours as needed for nausea or vomiting. 03/31/19   [name], NP  oxyCODONE-acetaminophen (PERCOCET/ROXICET) 5-325 MG tablet Take by mouth 2 (two) times daily as needed. 09/03/16   [provider]  QUEtiapine (SEROQUEL) 25 MG tablet Take 1 tablet by mouth daily. 05/26/19   [provider]  sertraline (ZOLOFT) 100 MG tablet Take 1 tablet (100 mg total) by mouth daily. 11/23/16   [name], NP    Allergies  Codeine, Hydrocodone, and Tramadol  Family History  Problem Relation Age of Onset  . Stroke Mother   . Hypertension Mother   . Thyroid disease Mother   . Cancer Father        Brain Tumor  . Hypertension Sister   . Thyroid disease Sister   . Hypertension Brother   . Mental illness Brother   . Thyroid disease Brother   . COPD Brother   . Heart disease Brother   . Cancer Maternal Aunt   . Cancer Maternal Grandmother   . Diabetes Neg Hx     Social History Social History   Tobacco Use  . Smoking status: Never Smoker  . Smokeless tobacco: Never Used  Substance Use Topics  . Alcohol use:  No  . Drug use: Yes    Comment: prescribed oxy through pain clinic for back    Review of Systems Constitutional: Positive for fever and chills.  Eyes: No visual changes. ENT: Positive for change in taste.  Cardiovascular: Positive for chest pain. Respiratory: Positive for cough. Gastrointestinal: No abdominal pain.  No nausea, no vomiting.  No diarrhea.   Genitourinary: Negative for dysuria. Musculoskeletal: Negative for back pain. Skin: Negative for rash. Neurological: Negative for headaches, focal weakness or numbness.  ____________________________________________   PHYSICAL EXAM:  VITAL SIGNS: ED Triage Vitals  Enc Vitals Group     BP 10/27/19 1728 134/83     Pulse Rate 10/27/19 1728 69     Resp --      Temp 10/27/19 1728 98 F (36.7 C)     Temp Source 10/27/19 1728 Oral     SpO2 10/27/19 1727 97 %     Weight 10/27/19 1728 157 lb (71.2 kg)     Height 10/27/19 1728 5\' 3"  (1.6 m)     Head Circumference --      Peak Flow --      Pain Score 10/27/19 1728 3   Constitutional: Alert and oriented.  Eyes: Conjunctivae are normal.  ENT      Head: Normocephalic and atraumatic.      Nose: No congestion/rhinnorhea.      Mouth/Throat: Mucous membranes are moist.      Neck: No stridor. Hematological/Lymphatic/Immunilogical: No cervical lymphadenopathy. Cardiovascular: Normal rate, regular rhythm.  No murmurs, rubs, or gallops.  Respiratory: Normal respiratory effort without tachypnea nor retractions. Breath sounds are clear and equal bilaterally. No wheezes/rales/rhonchi. Gastrointestinal: Soft and non tender. No rebound. No guarding.  Genitourinary: Deferred Musculoskeletal: Normal range of motion in all extremities. No lower extremity edema. Neurologic:  Normal speech and language. No gross focal neurologic deficits are appreciated.  Skin:  Skin is warm, dry and intact. No rash noted. Psychiatric: Mood and affect are normal. Speech and behavior are normal. Patient exhibits  appropriate insight and judgment.  ____________________________________________    LABS (pertinent positives/negatives)  CBC wbc 7.3, hgb 12.9, plt 274 Trop hs <2 BMP wnl ____________________________________________   EKG  I, Nance Pear, attending physician, personally viewed and interpreted this EKG  EKG Time: 1735 Rate: 60 Rhythm: normal sinus rhythm Axis: normal Intervals: qtc 430 QRS: narrow ST changes: no st elevation Impression: normal ekg ____________________________________________    RADIOLOGY  CXR No acute abnormality  ____________________________________________   PROCEDURES  Procedures  ____________________________________________   INITIAL IMPRESSION / ASSESSMENT AND PLAN / ED COURSE  Pertinent labs & imaging results that were available during my care of the patient were reviewed by me and considered in my medical decision making (see chart  for details).   Patient presented to the emergency department today with symptoms concerning for covid. CXR, ekg and blood work without concerning findings. Rapid covid was negative, however did discuss with patient that there are a lot of false negatives. Will plan on send out test for covid. Will give prescription for nausea and cough medication.  ____________________________________________   FINAL CLINICAL IMPRESSION(S) / ED DIAGNOSES  Final diagnoses:  Suspected COVID-19 virus infection     Note: This dictation was prepared with Dragon dictation. Any transcriptional errors that result from this process are unintentional     Nance Pear, MD 10/27/19 1929

## 2019-10-27 NOTE — Discharge Instructions (Addendum)
Please seek medical attention for any high fevers, chest pain, shortness of breath, change in behavior, persistent vomiting, bloody stool or any other new or concerning symptoms.  

## 2019-10-29 LAB — NOVEL CORONAVIRUS, NAA (HOSP ORDER, SEND-OUT TO REF LAB; TAT 18-24 HRS): SARS-CoV-2, NAA: NOT DETECTED

## 2019-10-30 ENCOUNTER — Encounter: Payer: Self-pay | Admitting: *Deleted

## 2019-10-30 ENCOUNTER — Telehealth: Payer: Self-pay

## 2019-10-30 NOTE — Telephone Encounter (Addendum)
Sharon Collins, can we get her in with her urologist any sooner? I am not sure if they will see her with these symptoms, but she did have a negative Covid test on the first.

## 2019-10-30 NOTE — Telephone Encounter (Signed)
Pt was seen at [facility] ED on 10/27/19 for covid symptoms; pt said lt sided pain worsened on 10/20/19 and when pt was in ED on 10/27/19 pt was hurting all over and did not mention the left sided pain. Lt sided pain is sharp above waistline with a constant pain level now of 7. Hurts worse when moves. Oxycodone is not helping pain. Pt has appt on 11/13/19 to see urologist from MRI last yr. No burning,pain or frequency of urine. When has bad pain it takes pts breath. 10/27/19 fever,chills, diarrhea,H/A and dizziness; Now, pt has S/T, dry cough on and off, pt has rash on upper body that does not appear blister like. Pt has loss of taste (not sure when developed loss of taste) tongue burns whenever she tries to eat anything. Pt does not think a virtual visit will help and wonders if urology appt can be done sooner than 11/13/19. Pt wants our office to contact urologist instead of pt calling urology. No N&V. UC & ED precautions given and pt voiced understanding. Pt request cb.

## 2019-11-01 NOTE — Telephone Encounter (Signed)
[facility], they have no earlier openings with any of their providers. Called patient and told her that with any kind of covid symptoms they would not be able to see her even if there was something sooner. The patient had a rapid covid test that was negative but they also did another covid test that they sent off and she hasnt gotten those test results back yet. She will wait for the 11/13/2019 appt.

## 2019-11-01 NOTE — Telephone Encounter (Signed)
Noted  

## 2019-11-02 ENCOUNTER — Telehealth: Payer: Self-pay

## 2019-11-02 NOTE — Telephone Encounter (Signed)
Patient called asking about her COVD results from ER. Advised patient they were both negative. Patient states she is about 70% better overall. No more fever, chills, or chest discomfort. She has a cough off and on and coughs up clear phlegm, headache off and on and sore throat off and on. Does have slight post nasal drainage. Patient uses Astelin spray, takes Singulair, Desloratadine 5 mg and Tylenol.  Patient wants to know since results are negative does that mean she is not contagious? Can she be out of quarantine? She has not had any Covid exposure that she knows of.

## 2019-11-02 NOTE — Telephone Encounter (Signed)
Please thank patient for the update, glad to know she's feeling better! Yes, she may come out of isolation.

## 2019-11-02 NOTE — Telephone Encounter (Signed)
Spoken and notified patient of Sharon Collins's comments. Patient verbalized understanding.  

## 2019-11-13 ENCOUNTER — Encounter: Payer: Self-pay | Admitting: Urology

## 2019-11-13 ENCOUNTER — Ambulatory Visit (INDEPENDENT_AMBULATORY_CARE_PROVIDER_SITE_OTHER): Payer: PPO | Admitting: Urology

## 2019-11-13 ENCOUNTER — Other Ambulatory Visit: Payer: Self-pay

## 2019-11-13 VITALS — BP 124/92 | HR 112 | Ht 63.0 in | Wt 175.0 lb

## 2019-11-13 DIAGNOSIS — N3020 Other chronic cystitis without hematuria: Secondary | ICD-10-CM

## 2019-11-13 NOTE — Progress Notes (Signed)
11/13/2019 3:19 PM   Sharon Collins 01-29-1951 [id number]  Referring provider: [name], NP [place],  [place] [address]  No chief complaint on file.   HPI: I was consulted to assist the patient's urinary symptoms and recent CT scan.  She has bilateral flank pain that comes and goes and goes from one side to the other is the primary complaint and I do believe has been seen at a pain clinic in [facility].  She can hold urination for many hours almost all day and then leak a small amount as a trigger to go to the restroom.  She can leak with coughing sneezing during allergy season and she can have a little bit urge incontinence but generally does not wear a pad  She describes a hysterectomy and bladder suspension many years ago and perhaps a dilation afterwards for retention.  Some of the details were difficult to ascertain.  Currently she has hesitancy.  She will get up sometimes 5 or 6 times a night.  She denies ankle edema and does not take Lasix  She had a CT scan which showed a small hernia but perhaps a foreign body in her bladder.  The findings were on each side in the pelvis.  There was no hydronephrosis and her kidneys were normal  The patient's flank pain is not from her kidneys.  She likely had a bladder suspension perhaps with bone anchors into the retro-symphysis.  The role of cystoscopy described.  She does have some hesitancy but otherwise other than nocturia she does not have a lot of other bladder symptoms.  Her nighttime frequency seems to be small-volume.  She had no microscopic hematuria today  Picture drawn.  Likely the foreign body is outside her bladder but for safety reasons she will come back for elective cystoscopy.  She understands findings not related to back pain  Date Frequency stable.  Vague bilateral back pain stable. Cystoscopy: Patient underwent flexible cystoscopy using oral technique.  Bladder mucosa and trigone were  normal.  No foreign body in the bladder.  No carcinoma.  No cystitis.    PMH: Past Medical History:  Diagnosis Date  . Acute medial meniscus tear    left  . Allergic rhinitis   . Anxiety   . Anxiety and depression   . Asthma   . Atrophic vaginitis   . Cataracts, bilateral   . Cervical radiculopathy   . Degenerative disc disease, lumbar   . Insomnia   . Urinary incontinence     Surgical History: Past Surgical History:  Procedure Laterality Date  . BUNIONECTOMY    . CATARACT EXTRACTION Right 08/2017  . HAMMER TOE SURGERY    . LAMINECTOMY    . MANDIBLE FRACTURE SURGERY    . MANDIBLE FRACTURE SURGERY      Home Medications:  Allergies as of 11/13/2019      Reactions   Codeine    Hydrocodone Nausea And Vomiting   Vicodin causes vomiting, but when takes Phenergan 0.25 mg with it, she can tolerate it.   Tramadol Nausea And Vomiting   Causes vomiting & affects her heart rhythm.      Medication List       Accurate as of November 13, 2019  3:19 PM. If you have any questions, ask your nurse or doctor.        albuterol 108 (90 Base) MCG/ACT inhaler Commonly known as: VENTOLIN HFA Inhale 2 puffs into the lungs every  6 (six) hours as needed for wheezing or shortness of breath.   azelastine 0.1 % nasal spray Commonly known as: ASTELIN Place 1 spray into both nostrils 2 (two) times daily as needed for rhinitis.   benzonatate 100 MG capsule Commonly known as: Tessalon Perles Take 1 capsule (100 mg total) by mouth every 6 (six) hours as needed for cough.   etodolac 500 MG tablet Commonly known as: LODINE Take 1 tablet by mouth once daily as needed for pain.   fexofenadine 180 MG tablet Commonly known as: ALLEGRA Take 180 mg by mouth daily.   gabapentin 300 MG capsule Commonly known as: NEURONTIN Take 600 mg by mouth 3 (three) times daily.   hydrOXYzine 25 MG tablet Commonly known as: ATARAX/VISTARIL   lamoTRIgine 100 MG tablet Commonly known as: LAMICTAL Take  1 tablet (100 mg total) by mouth daily.   montelukast 10 MG tablet Commonly known as: SINGULAIR Take 1 tablet (10 mg total) by mouth at bedtime. For allergies.   ondansetron 4 MG disintegrating tablet Commonly known as: ZOFRAN-ODT Take 1 tablet (4 mg total) by mouth every 8 (eight) hours as needed for nausea or vomiting.   ondansetron 4 MG tablet Commonly known as: Zofran Take 1 tablet (4 mg total) by mouth every 8 (eight) hours as needed.   oxyCODONE-acetaminophen 5-325 MG tablet Commonly known as: PERCOCET/ROXICET Take by mouth 2 (two) times daily as needed.   QUEtiapine 25 MG tablet Commonly known as: SEROQUEL Take 1 tablet by mouth daily.   sertraline 100 MG tablet Commonly known as: ZOLOFT Take 1 tablet (100 mg total) by mouth daily.       Allergies:  Allergies  Allergen Reactions  . Codeine   . Hydrocodone Nausea And Vomiting    Vicodin causes vomiting, but when takes Phenergan 0.25 mg with it, she can tolerate it.  . Tramadol Nausea And Vomiting    Causes vomiting & affects her heart rhythm.    Family History: Family History  Problem Relation Age of Onset  . Stroke Mother   . Hypertension Mother   . Thyroid disease Mother   . Cancer Father        Brain Tumor  . Hypertension Sister   . Thyroid disease Sister   . Hypertension Brother   . Mental illness Brother   . Thyroid disease Brother   . COPD Brother   . Heart disease Brother   . Cancer Maternal Aunt   . Cancer Maternal Grandmother   . Diabetes Neg Hx     Social History:  reports that she has never smoked. She has never used smokeless tobacco. She reports current drug use. She reports that she does not drink alcohol.  ROS: UROLOGY Frequent Urination?: Yes Hard to postpone urination?: No Burning/pain with urination?: No Get up at night to urinate?: Yes Leakage of urine?: No Urine stream starts and stops?: No Trouble starting stream?: Yes Do you have to strain to urinate?: No Blood in  urine?: No Urinary tract infection?: No Sexually transmitted disease?: No Injury to kidneys or bladder?: No Painful intercourse?: No Weak stream?: No Currently pregnant?: No Vaginal bleeding?: No Last menstrual period?: n  Gastrointestinal Nausea?: No Vomiting?: No Indigestion/heartburn?: No Diarrhea?: No Constipation?: No  Constitutional Fever: No Night sweats?: No Weight loss?: No Fatigue?: No  Skin Skin rash/lesions?: No Itching?: No  Eyes Blurred vision?: No Double vision?: No  Ears/Nose/Throat Sore throat?: No Sinus problems?: No  Hematologic/Lymphatic Swollen glands?: No Easy bruising?: No  Cardiovascular  Leg swelling?: No Chest pain?: No  Respiratory Cough?: No Shortness of breath?: No  Endocrine Excessive thirst?: No  Musculoskeletal Back pain?: No Joint pain?: No  Neurological Headaches?: No Dizziness?: No  Psychologic Depression?: No Anxiety?: No  Physical Exam: BP (!) 124/92   Pulse (!) 112   Ht 5\' 3"  (1.6 m)   Wt 79.4 kg   LMP  (LMP Unknown)   BMI 31.00 kg/m   Constitutional:  Alert and oriented, No acute distress.  Laboratory Data: Lab Results  Component Value Date   WBC 7.3 10/27/2019   HGB 12.9 10/27/2019   HCT 39.0 10/27/2019   MCV 78.5 (L) 10/27/2019   PLT 274 10/27/2019    Lab Results  Component Value Date   CREATININE 0.72 10/27/2019    No results found for: PSA  No results found for: TESTOSTERONE  Lab Results  Component Value Date   HGBA1C 5.7 (A) 03/31/2019    Urinalysis    Component Value Date/Time   APPEARANCEUR Clear 11/14/2018 1034   GLUCOSEU Negative 11/14/2018 1034   BILIRUBINUR Negative 10/19/2019 1114   BILIRUBINUR Negative 11/14/2018 1034   PROTEINUR Negative 10/19/2019 1114   PROTEINUR Negative 11/14/2018 1034   UROBILINOGEN 0.2 10/19/2019 1114   NITRITE Negative 10/19/2019 1114   NITRITE Negative 11/14/2018 1034   LEUKOCYTESUR Negative 10/19/2019 1114   LEUKOCYTESUR Negative  11/14/2018 1034    Pertinent Imaging:   Assessment & Plan: Reassurance given.  See as needed  There are no diagnoses linked to this encounter.  No follow-ups on file.  Reece Packer, MD  [facility] [address] [place], [place] [address] [phone]

## 2019-11-22 DIAGNOSIS — M76821 Posterior tibial tendinitis, right leg: Secondary | ICD-10-CM | POA: Diagnosis not present

## 2019-11-22 DIAGNOSIS — M79671 Pain in right foot: Secondary | ICD-10-CM | POA: Diagnosis not present

## 2019-11-22 DIAGNOSIS — M76829 Posterior tibial tendinitis, unspecified leg: Secondary | ICD-10-CM | POA: Insufficient documentation

## 2019-12-05 DIAGNOSIS — M76821 Posterior tibial tendinitis, right leg: Secondary | ICD-10-CM | POA: Diagnosis not present

## 2020-01-02 DIAGNOSIS — F319 Bipolar disorder, unspecified: Secondary | ICD-10-CM | POA: Diagnosis not present

## 2020-01-02 DIAGNOSIS — F5105 Insomnia due to other mental disorder: Secondary | ICD-10-CM | POA: Diagnosis not present

## 2020-01-02 IMAGING — MG MM DIGITAL SCREENING BILAT W/ TOMO W/ CAD
6 of 10 series · 6 of 30 positions shown · non-contrast
Comparison: Previous exam(s).

CLINICAL DATA: Screening.

EXAM:
DIGITAL SCREENING BILATERAL MAMMOGRAM WITH TOMO AND CAD

[R MLO synth-2D]
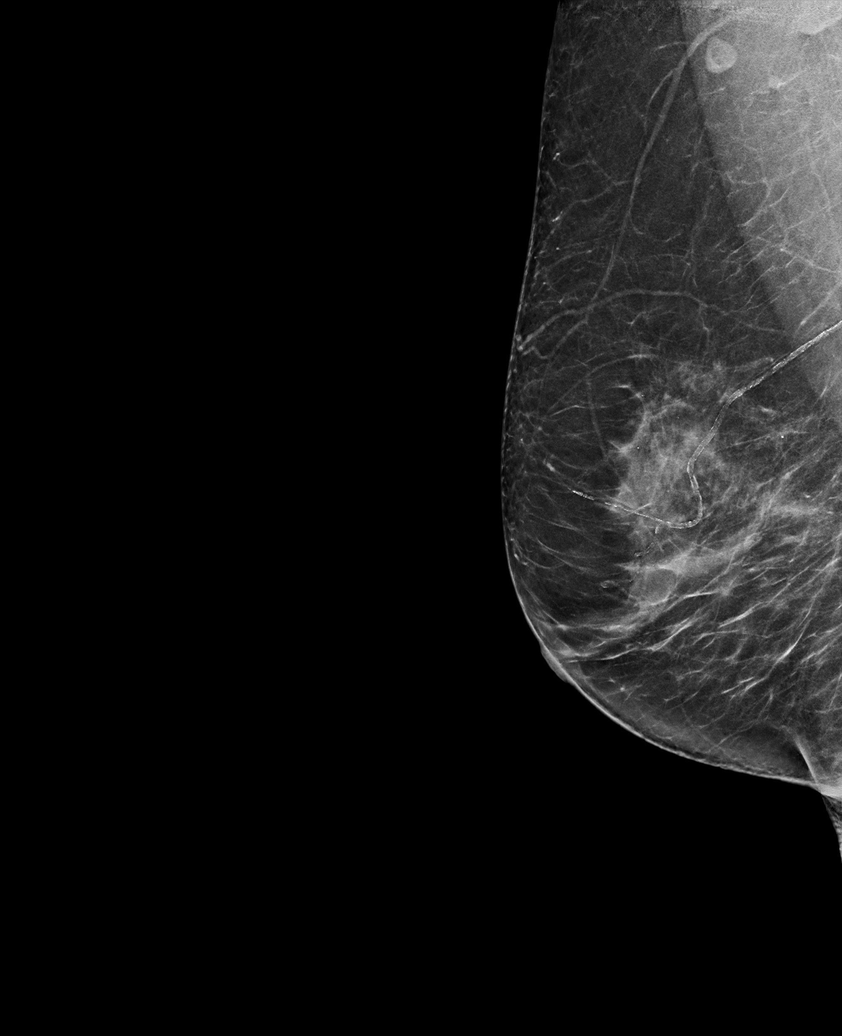

[L MLO synth-2D]
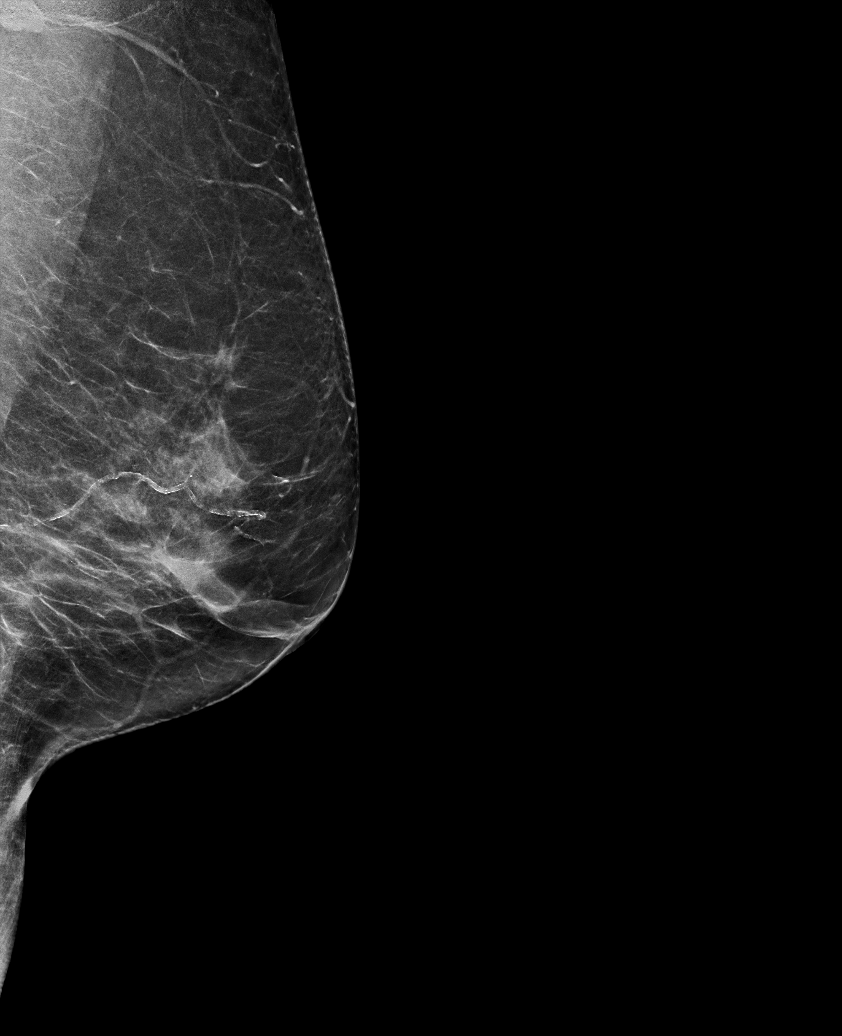

[R CC synth-2D]
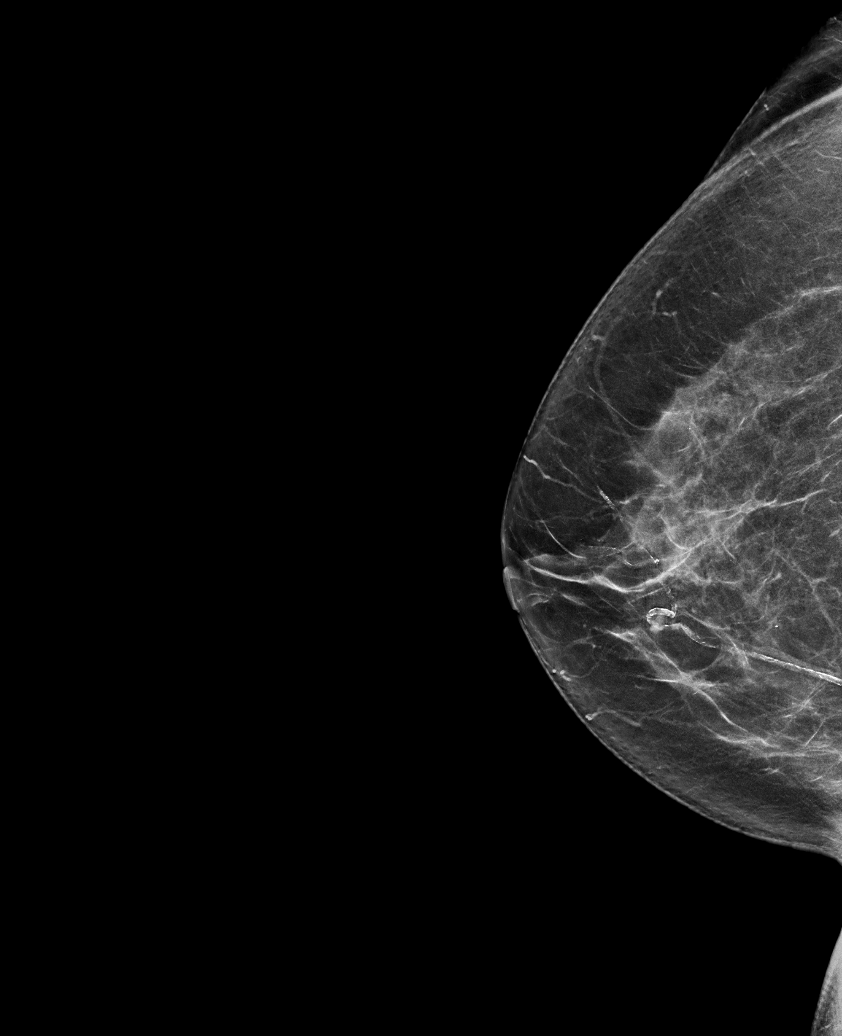

[L CC synth-2D (1 of 2)]
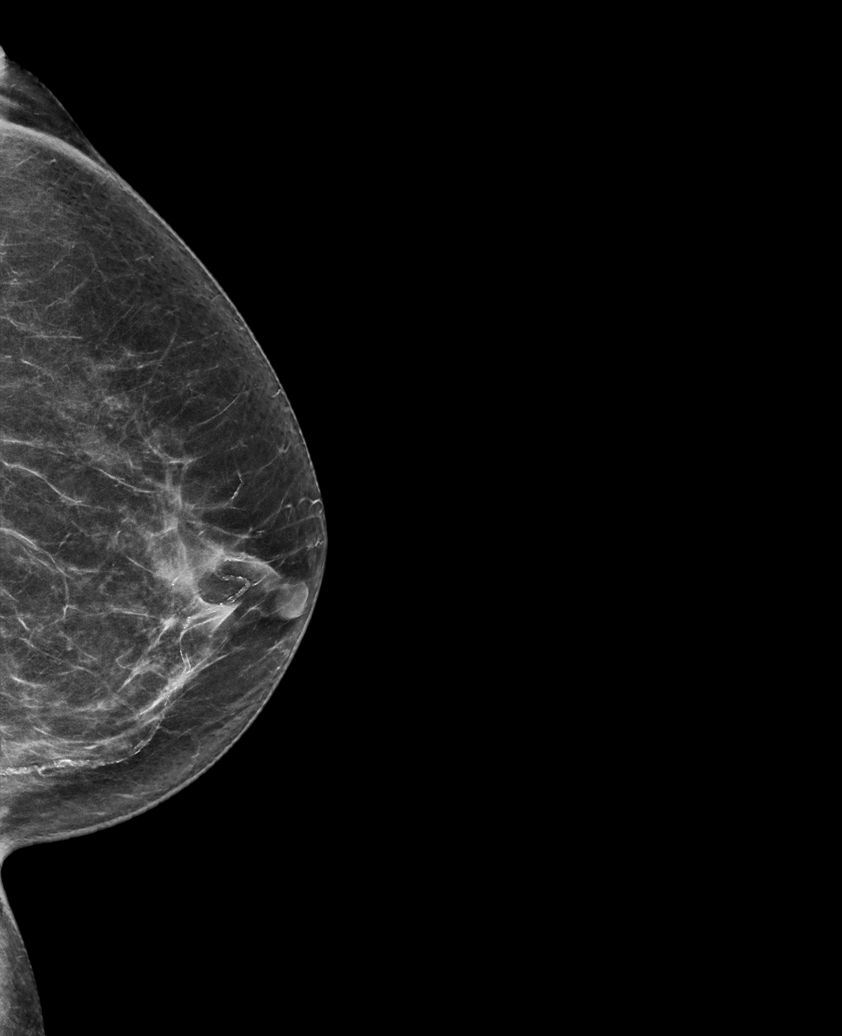

[L CC synth-2D (2 of 2)]
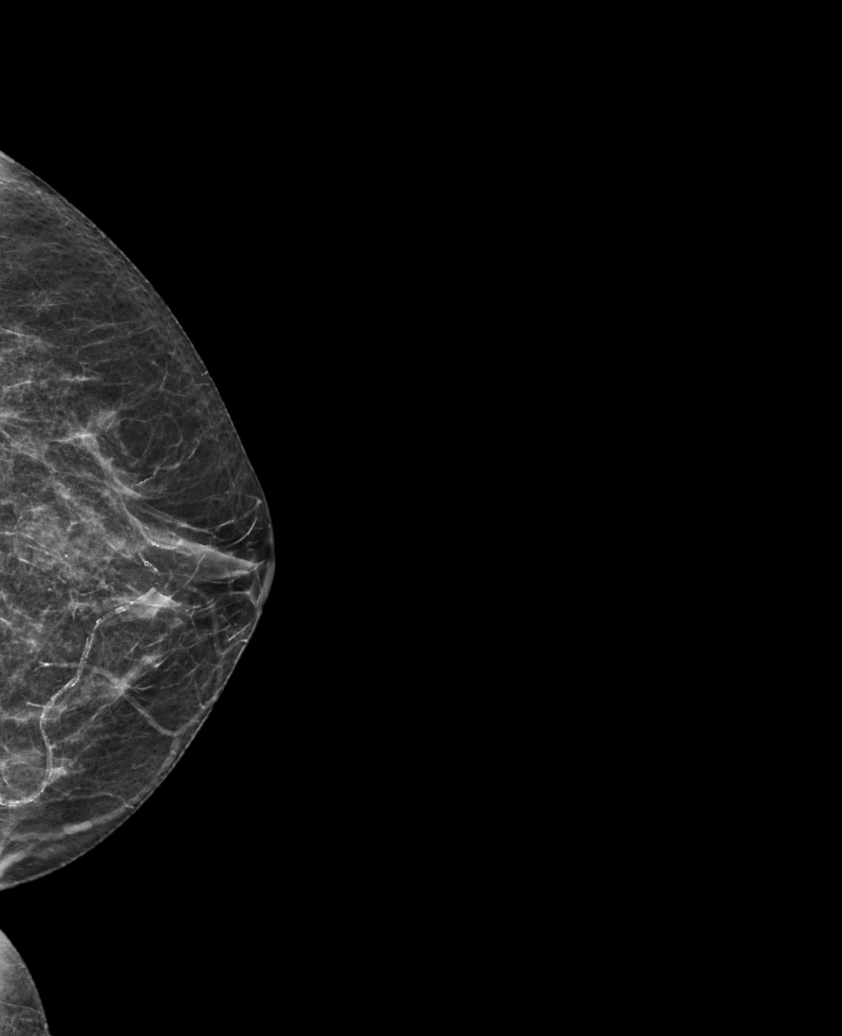

[L MLO tomo · tomo slice 33/66.0]
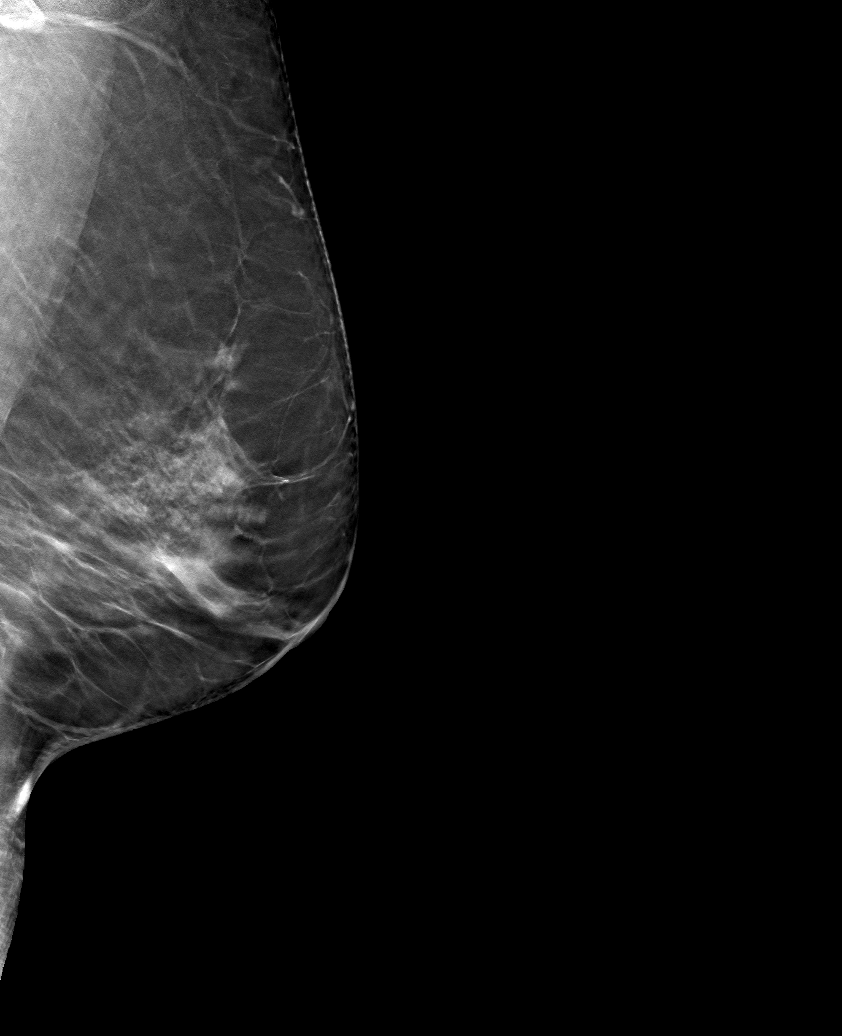

[6 of 30 positions shown; findings below may reference images not displayed]

ACR Breast Density Category b: There are scattered areas of
fibroglandular density.
FINDINGS: There are no findings suspicious for malignancy. Images were
processed with CAD.
IMPRESSION: No mammographic evidence of malignancy. A result letter of this
screening mammogram will be mailed directly to the patient.

RECOMMENDATION:
Screening mammogram in one year. (Code:[id number])

BI-RADS CATEGORY  1: Negative.

## 2020-01-08 DIAGNOSIS — M4722 Other spondylosis with radiculopathy, cervical region: Secondary | ICD-10-CM | POA: Diagnosis not present

## 2020-01-08 DIAGNOSIS — G894 Chronic pain syndrome: Secondary | ICD-10-CM | POA: Diagnosis not present

## 2020-01-08 DIAGNOSIS — M25562 Pain in left knee: Secondary | ICD-10-CM | POA: Diagnosis not present

## 2020-01-08 DIAGNOSIS — G8929 Other chronic pain: Secondary | ICD-10-CM | POA: Diagnosis not present

## 2020-01-08 DIAGNOSIS — M47816 Spondylosis without myelopathy or radiculopathy, lumbar region: Secondary | ICD-10-CM | POA: Diagnosis not present

## 2020-01-08 DIAGNOSIS — M961 Postlaminectomy syndrome, not elsewhere classified: Secondary | ICD-10-CM | POA: Diagnosis not present

## 2020-01-08 DIAGNOSIS — M533 Sacrococcygeal disorders, not elsewhere classified: Secondary | ICD-10-CM | POA: Diagnosis not present

## 2020-01-26 ENCOUNTER — Ambulatory Visit: Payer: PPO | Attending: Internal Medicine

## 2020-01-26 ENCOUNTER — Other Ambulatory Visit: Payer: Self-pay

## 2020-01-26 DIAGNOSIS — Z23 Encounter for immunization: Secondary | ICD-10-CM

## 2020-01-26 NOTE — Progress Notes (Signed)
   Covid-19 Vaccination Clinic  Name:  Sharon Collins    MRN: [id number] DOB: 20-Jun-1951  01/26/2020  [name] was observed post Covid-19 immunization for 15 minutes without incident. She was provided with Vaccine Information Sheet and instruction to access the V-Safe system.   [name] was instructed to call 911 with any severe reactions post vaccine: Marland Kitchen Difficulty breathing  . Swelling of face and throat  . A fast heartbeat  . A bad rash all over body  . Dizziness and weakness   Immunizations Administered    Name Date Dose VIS Date Route   Pfizer COVID-19 Vaccine 01/26/2020 10:43 AM 0.3 mL 10/06/2019 Intramuscular   Manufacturer: [place]   Lot: [id number]   [place]: [id number]

## 2020-02-20 ENCOUNTER — Ambulatory Visit: Payer: PPO | Attending: Internal Medicine

## 2020-02-20 DIAGNOSIS — Z23 Encounter for immunization: Secondary | ICD-10-CM

## 2020-02-20 NOTE — Progress Notes (Signed)
   Covid-19 Vaccination Clinic  Name:  TEIANA STUMPE    MRN: [id number] DOB: 1951/08/09  02/20/2020  Ms. Barrientez was observed post Covid-19 immunization for 15 minutes without incident. She was provided with Vaccine Information Sheet and instruction to access the V-Safe system.   Ms. Vanvorst was instructed to call 911 with any severe reactions post vaccine: Marland Kitchen Difficulty breathing  . Swelling of face and throat  . A fast heartbeat  . A bad rash all over body  . Dizziness and weakness   Immunizations Administered    Name Date Dose VIS Date Route   Pfizer COVID-19 Vaccine 02/20/2020 12:25 PM 0.3 mL 12/20/2018 Intramuscular   Manufacturer: [place]   Lot: [id number]   [place]: [id number]

## 2020-04-03 DIAGNOSIS — F5105 Insomnia due to other mental disorder: Secondary | ICD-10-CM | POA: Diagnosis not present

## 2020-04-03 DIAGNOSIS — F319 Bipolar disorder, unspecified: Secondary | ICD-10-CM | POA: Diagnosis not present

## 2020-04-25 ENCOUNTER — Other Ambulatory Visit: Payer: Self-pay | Admitting: Primary Care

## 2020-04-25 DIAGNOSIS — M47816 Spondylosis without myelopathy or radiculopathy, lumbar region: Secondary | ICD-10-CM | POA: Diagnosis not present

## 2020-04-25 DIAGNOSIS — M25562 Pain in left knee: Secondary | ICD-10-CM | POA: Diagnosis not present

## 2020-04-25 DIAGNOSIS — G894 Chronic pain syndrome: Secondary | ICD-10-CM | POA: Diagnosis not present

## 2020-04-25 DIAGNOSIS — G8929 Other chronic pain: Secondary | ICD-10-CM | POA: Diagnosis not present

## 2020-04-25 DIAGNOSIS — Z6829 Body mass index (BMI) 29.0-29.9, adult: Secondary | ICD-10-CM | POA: Diagnosis not present

## 2020-04-25 DIAGNOSIS — M533 Sacrococcygeal disorders, not elsewhere classified: Secondary | ICD-10-CM | POA: Diagnosis not present

## 2020-04-25 DIAGNOSIS — M961 Postlaminectomy syndrome, not elsewhere classified: Secondary | ICD-10-CM | POA: Diagnosis not present

## 2020-04-25 DIAGNOSIS — M47812 Spondylosis without myelopathy or radiculopathy, cervical region: Secondary | ICD-10-CM | POA: Diagnosis not present

## 2020-04-25 DIAGNOSIS — M4722 Other spondylosis with radiculopathy, cervical region: Secondary | ICD-10-CM | POA: Diagnosis not present

## 2020-04-25 NOTE — Telephone Encounter (Signed)
Last prescribed on 07/22/2018 Last OV (acute ) with Sharon Collins on 10/19/2019 No future OV scheduled

## 2020-04-26 NOTE — Telephone Encounter (Signed)
Patient is overdue for general follow-up/physical. I will refill her medication if she schedules her physical. Please notify me when this is done.

## 2020-04-26 NOTE — Telephone Encounter (Signed)
Spoken and notified patient of Sharon Collins comments.   Follow up is schedule on 05/20/2020

## 2020-04-26 NOTE — Telephone Encounter (Signed)
Refills sent to pharmacy. 

## 2020-05-20 ENCOUNTER — Ambulatory Visit (INDEPENDENT_AMBULATORY_CARE_PROVIDER_SITE_OTHER): Payer: PPO | Admitting: Primary Care

## 2020-05-20 ENCOUNTER — Encounter: Payer: Self-pay | Admitting: Primary Care

## 2020-05-20 ENCOUNTER — Other Ambulatory Visit: Payer: Self-pay

## 2020-05-20 VITALS — BP 134/82 | HR 82 | Temp 96.4°F | Ht 63.0 in | Wt 160.5 lb

## 2020-05-20 DIAGNOSIS — G8929 Other chronic pain: Secondary | ICD-10-CM

## 2020-05-20 DIAGNOSIS — M549 Dorsalgia, unspecified: Secondary | ICD-10-CM

## 2020-05-20 DIAGNOSIS — Z1231 Encounter for screening mammogram for malignant neoplasm of breast: Secondary | ICD-10-CM | POA: Diagnosis not present

## 2020-05-20 DIAGNOSIS — F419 Anxiety disorder, unspecified: Secondary | ICD-10-CM | POA: Diagnosis not present

## 2020-05-20 DIAGNOSIS — Z1283 Encounter for screening for malignant neoplasm of skin: Secondary | ICD-10-CM | POA: Diagnosis not present

## 2020-05-20 DIAGNOSIS — F329 Major depressive disorder, single episode, unspecified: Secondary | ICD-10-CM

## 2020-05-20 DIAGNOSIS — J3089 Other allergic rhinitis: Secondary | ICD-10-CM | POA: Diagnosis not present

## 2020-05-20 DIAGNOSIS — Z0000 Encounter for general adult medical examination without abnormal findings: Secondary | ICD-10-CM

## 2020-05-20 DIAGNOSIS — R7303 Prediabetes: Secondary | ICD-10-CM | POA: Diagnosis not present

## 2020-05-20 DIAGNOSIS — Z23 Encounter for immunization: Secondary | ICD-10-CM

## 2020-05-20 DIAGNOSIS — L709 Acne, unspecified: Secondary | ICD-10-CM | POA: Diagnosis not present

## 2020-05-20 LAB — COMPREHENSIVE METABOLIC PANEL
ALT: 11 U/L (ref 0–35)
AST: 15 U/L (ref 0–37)
Albumin: 4.2 g/dL (ref 3.5–5.2)
Alkaline Phosphatase: 63 U/L (ref 39–117)
BUN: 10 mg/dL (ref 6–23)
CO2: 28 mEq/L (ref 19–32)
Calcium: 9.2 mg/dL (ref 8.4–10.5)
Chloride: 105 mEq/L (ref 96–112)
Creatinine, Ser: 0.96 mg/dL (ref 0.40–1.20)
GFR: 57.67 mL/min — ABNORMAL LOW (ref >60.00)
Glucose, Bld: 94 mg/dL (ref 70–99)
Potassium: 4.2 mEq/L (ref 3.5–5.1)
Sodium: 139 mEq/L (ref 135–145)
Total Bilirubin: 0.4 mg/dL (ref 0.2–1.2)
Total Protein: 6.6 g/dL (ref 6.0–8.3)

## 2020-05-20 LAB — CBC
HCT: 38.2 % (ref 36.0–46.0)
Hemoglobin: 12.6 g/dL (ref 12.0–15.0)
MCHC: 32.9 g/dL (ref 30.0–36.0)
MCV: 79.6 fl (ref 78.0–100.0)
Platelets: 308 10*3/uL (ref 150.0–400.0)
RBC: 4.8 Mil/uL (ref 3.87–5.11)
RDW: 15.1 % (ref 11.5–15.5)
WBC: 7.7 10*3/uL (ref 4.0–10.5)

## 2020-05-20 LAB — LIPID PANEL
Cholesterol: 205 mg/dL — ABNORMAL HIGH (ref 0–200)
HDL: 59.9 mg/dL (ref >39.00)
LDL Cholesterol: 122 mg/dL — ABNORMAL HIGH (ref 0–99)
NonHDL: 144.65
Total CHOL/HDL Ratio: 3
Triglycerides: 111 mg/dL (ref 0.0–149.0)
VLDL: 22.2 mg/dL (ref 0.0–40.0)

## 2020-05-20 LAB — HEMOGLOBIN A1C: Hgb A1c MFr Bld: 5.9 % (ref 4.6–6.5)

## 2020-05-20 MED ORDER — ZOSTER VAC RECOMB ADJUVANTED 50 MCG/0.5ML IM SUSR: 0.5000 mL | Freq: Once | INTRAMUSCULAR | 1 refills | Status: AC

## 2020-05-20 MED ORDER — CLINDAMYCIN PHOS-BENZOYL PEROX 1-5 % EX GEL: Freq: Two times a day (BID) | CUTANEOUS | 0 refills | Status: DC

## 2020-05-20 NOTE — Assessment & Plan Note (Signed)
Rx for Shingrix provided, other immunizations UTD. Mammogram due in September 2021. Bone density scan UTD. Colonoscopy UTD, due in 2024.  Discussed the importance of a healthy diet and regular exercise in order for weight loss, and to reduce the risk of any potential medical problems.  Exam today stable. Labs pending.

## 2020-05-20 NOTE — Assessment & Plan Note (Signed)
Overall stable. Using albuterol infrequently. Continue current regimen.

## 2020-05-20 NOTE — Assessment & Plan Note (Addendum)
Following with pain management through [facility], doing well on Gabapentin, Percocet, and Etodolac. Using Zofran PRN due to nausea from medications. Continue same.

## 2020-05-20 NOTE — Assessment & Plan Note (Signed)
A1C of 5.7 last year, repeat A1C pending.

## 2020-05-20 NOTE — Assessment & Plan Note (Signed)
Following with psychiatry, overall feels well managed. Continue current regimen.

## 2020-05-20 NOTE — Assessment & Plan Note (Signed)
Since having to wear a mask, improves without consistent mask use, but has to wear a mask at work.  Appears to be facial acne. No improvement with OTC treatment. Rx for Benzaclin gel sent to pharmacy. She will update in a few weeks if no improvement.

## 2020-05-20 NOTE — Patient Instructions (Addendum)
Take the shingles vaccine to the pharmacy.  Stop by the lab prior to leaving today. I will notify you of your results once received.   Schedule your mammogram.  Try the benzoyl peroxide with clindamycin gel twice daily for acne. Please update me in a few weeks.  Continue to work on a healthy diet. Ensure you are consuming 64 ounces of water daily.  It was a pleasure to see you today!   Preventive Care 54 Years and Older, Female Preventive care refers to lifestyle choices and visits with your health care provider that can promote health and wellness. This includes:  A yearly physical exam. This is also called an annual well check.  Regular dental and eye exams.  Immunizations.  Screening for certain conditions.  Healthy lifestyle choices, such as diet and exercise. What can I expect for my preventive care visit? Physical exam Your health care provider will check:  Height and weight. These may be used to calculate body mass index (BMI), which is a measurement that tells if you are at a healthy weight.  Heart rate and blood pressure.  Your skin for abnormal spots. Counseling Your health care provider may ask you questions about:  Alcohol, tobacco, and drug use.  Emotional well-being.  Home and relationship well-being.  Sexual activity.  Eating habits.  History of falls.  Memory and ability to understand (cognition).  Work and work Statistician.  Pregnancy and menstrual history. What immunizations do I need?  Influenza (flu) vaccine  This is recommended every year. Tetanus, diphtheria, and pertussis (Tdap) vaccine  You may need a Td booster every 10 years. Varicella (chickenpox) vaccine  You may need this vaccine if you have not already been vaccinated. Zoster (shingles) vaccine  You may need this after age 15. Pneumococcal conjugate (PCV13) vaccine  One dose is recommended after age 81. Pneumococcal polysaccharide (PPSV23) vaccine  One dose is  recommended after age 9. Measles, mumps, and rubella (MMR) vaccine  You may need at least one dose of MMR if you were born in 1957 or later. You may also need a second dose. Meningococcal conjugate (MenACWY) vaccine  You may need this if you have certain conditions. Hepatitis A vaccine  You may need this if you have certain conditions or if you travel or work in places where you may be exposed to hepatitis A. Hepatitis B vaccine  You may need this if you have certain conditions or if you travel or work in places where you may be exposed to hepatitis B. Haemophilus influenzae type b (Hib) vaccine  You may need this if you have certain conditions. You may receive vaccines as individual doses or as more than one vaccine together in one shot (combination vaccines). Talk with your health care provider about the risks and benefits of combination vaccines. What tests do I need? Blood tests  Lipid and cholesterol levels. These may be checked every 5 years, or more frequently depending on your overall health.  Hepatitis C test.  Hepatitis B test. Screening  Lung cancer screening. You may have this screening every year starting at age 67 if you have a 30-pack-year history of smoking and currently smoke or have quit within the past 15 years.  Colorectal cancer screening. All adults should have this screening starting at age 6 and continuing until age 42. Your health care provider may recommend screening at age 52 if you are at increased risk. You will have tests every 1-10 years, depending on your results and the  type of screening test.  Diabetes screening. This is done by checking your blood sugar (glucose) after you have not eaten for a while (fasting). You may have this done every 1-3 years.  Mammogram. This may be done every 1-2 years. Talk with your health care provider about how often you should have regular mammograms.  BRCA-related cancer screening. This may be done if you have a  family history of breast, ovarian, tubal, or peritoneal cancers. Other tests  Sexually transmitted disease (STD) testing.  Bone density scan. This is done to screen for osteoporosis. You may have this done starting at age 11. Follow these instructions at home: Eating and drinking  Eat a diet that includes fresh fruits and vegetables, whole grains, lean protein, and low-fat dairy products. Limit your intake of foods with high amounts of sugar, saturated fats, and salt.  Take vitamin and mineral supplements as recommended by your health care provider.  Do not drink alcohol if your health care provider tells you not to drink.  If you drink alcohol: ? Limit how much you have to 0-1 drink a day. ? Be aware of how much alcohol is in your drink. In the [place] one drink equals one 12 oz bottle of beer (355 mL), one 5 oz glass of wine (148 mL), or one 1 oz glass of hard liquor (44 mL). Lifestyle  Take daily care of your teeth and gums.  Stay active. Exercise for at least 30 minutes on 5 or more days each week.  Do not use any products that contain nicotine or tobacco, such as cigarettes, e-cigarettes, and chewing tobacco. If you need help quitting, ask your health care provider.  If you are sexually active, practice safe sex. Use a condom or other form of protection in order to prevent STIs (sexually transmitted infections).  Talk with your health care provider about taking a low-dose aspirin or statin. What's next?  Go to your health care provider once a year for a well check visit.  Ask your health care provider how often you should have your eyes and teeth checked.  Stay up to date on all vaccines. This information is not intended to replace advice given to you by your health care provider. Make sure you discuss any questions you have with your health care provider. Document Revised: 10/06/2018 Document Reviewed: 10/06/2018 Elsevier Patient Education  2020 [employer].

## 2020-05-20 NOTE — Progress Notes (Signed)
Subjective:    Patient ID: Sharon Collins, female    DOB: 06-Mar-1951, 69 y.o.   MRN: [id number]  HPI  This visit occurred during the SARS-CoV-2 public health emergency.  Safety protocols were in place, including screening questions prior to the visit, additional usage of staff PPE, and extensive cleaning of exam room while observing appropriate contact time as indicated for disinfecting solutions.   Sharon Collins is a 69 year old female who presents today for complete physical.  Immunizations: -Tetanus: Completed in 2010 -Influenza: Due this season  -Shingles: Completed Zostavax in 2015 -Pneumonia: Completed Prevnar and Pneumovax -Covid-19: Completed series  Diet: She endorses a healthy diet.  Exercise: She is not exercising regularly   Eye exam: Completed in 2021 Dental exam: Due, she will schedule   Mammogram: Completed in September 2020 Dexa: Completed in 2020, compliant to calcium and vitamin D Colonoscopy: Completed in 2014, due 2024 Hep C Screen: Negative  BP Readings from Last 3 Encounters:  05/20/20 (!) 134/82  11/13/19 (!) 124/92  10/27/19 117/63      Review of Systems  Constitutional: Negative for unexpected weight change.  HENT: Positive for postnasal drip. Negative for rhinorrhea.   Respiratory: Negative for cough and shortness of breath.   Cardiovascular: Negative for chest pain.  Gastrointestinal: Negative for constipation and diarrhea.  Genitourinary: Negative for difficulty urinating.  Musculoskeletal: Positive for arthralgias and back pain.  Skin:       Facial acne from wearing a mask.  Also new spots to face and back, would like to see dermatology.   Allergic/Immunologic: Positive for environmental allergies.  Neurological: Positive for headaches. Negative for dizziness.  Hematological: Negative for adenopathy.  Psychiatric/Behavioral: The patient is not nervous/anxious.        Past Medical History:  Diagnosis Date  . Acute medial meniscus  tear    left  . Allergic rhinitis   . Anxiety   . Anxiety and depression   . Asthma   . Atrophic vaginitis   . Cataracts, bilateral   . Cervical radiculopathy   . Degenerative disc disease, lumbar   . Insomnia   . Urinary incontinence      Social History   Socioeconomic History  . Marital status: Married    Spouse name: Not on file  . Number of children: Not on file  . Years of education: Not on file  . Highest education level: Not on file  Occupational History  . Not on file  Tobacco Use  . Smoking status: Never Smoker  . Smokeless tobacco: Never Used  Vaping Use  . Vaping Use: Never used  Substance and Sexual Activity  . Alcohol use: No  . Drug use: Yes    Comment: prescribed oxy through pain clinic for back  . Sexual activity: Not on file  Other Topics Concern  . Not on file  Social History Narrative   Married.   3 children, 11 grandchildren, 1 great grand child.   Retired. Worked at [employer], [place], [place], [place].   Enjoys spending time with her family.   Social Determinants of Health   Financial Resource Strain:   . Difficulty of Paying Living Expenses:   Food Insecurity:   . Worried About Charity fundraiser in the Last Year:   . Arboriculturist in the Last Year:   Transportation Needs:   . Film/video editor (Medical):   Marland Kitchen Lack of Transportation (Non-Medical):   Physical Activity:   . Days  of Exercise per Week:   . Minutes of Exercise per Session:   Stress:   . Feeling of Stress :   Social Connections:   . Frequency of Communication with Friends and Family:   . Frequency of Social Gatherings with Friends and Family:   . Attends Religious Services:   . Active Member of Clubs or Organizations:   . Attends Archivist Meetings:   Marland Kitchen Marital Status:   Intimate Partner Violence:   . Fear of Current or Ex-Partner:   . Emotionally Abused:   Marland Kitchen Physically Abused:   . Sexually Abused:     Past Surgical History:  Procedure  Laterality Date  . BUNIONECTOMY    . CATARACT EXTRACTION Right 08/2017  . HAMMER TOE SURGERY    . LAMINECTOMY    . MANDIBLE FRACTURE SURGERY    . MANDIBLE FRACTURE SURGERY      Family History  Problem Relation Age of Onset  . Stroke Mother   . Hypertension Mother   . Thyroid disease Mother   . Cancer Father        Brain Tumor  . Hypertension Sister   . Thyroid disease Sister   . Hypertension Brother   . Mental illness Brother   . Thyroid disease Brother   . COPD Brother   . Heart disease Brother   . Cancer Maternal Aunt   . Cancer Maternal Grandmother   . Diabetes Neg Hx     Allergies  Allergen Reactions  . Codeine   . Hydrocodone Nausea And Vomiting    Vicodin causes vomiting, but when takes Phenergan 0.25 mg with it, she can tolerate it.  . Tramadol Nausea And Vomiting    Causes vomiting & affects her heart rhythm.    Current Outpatient Medications on File Prior to Visit  Medication Sig Dispense Refill  . albuterol (PROVENTIL HFA;VENTOLIN HFA) 108 (90 Base) MCG/ACT inhaler Inhale 2 puffs into the lungs every 6 (six) hours as needed for wheezing or shortness of breath. 1 Inhaler 12  . azelastine (ASTELIN) 0.1 % nasal spray Place 1 spray into both nostrils 2 (two) times daily as needed for rhinitis. 30 mL 5  . etodolac (LODINE) 500 MG tablet TAKE 1 TABLET BY MOUTH ONCE DAILY AS NEEDED FOR PAIN 30 tablet 0  . fexofenadine (ALLEGRA) 180 MG tablet Take 180 mg by mouth daily.    Marland Kitchen gabapentin (NEURONTIN) 300 MG capsule Take 600 mg by mouth 3 (three) times daily.     . hydrOXYzine (ATARAX/VISTARIL) 25 MG tablet Take 25 mg by mouth daily as needed.     . lamoTRIgine (LAMICTAL) 100 MG tablet Take 1 tablet (100 mg total) by mouth daily. 90 tablet 0  . montelukast (SINGULAIR) 10 MG tablet Take 1 tablet (10 mg total) by mouth at bedtime. For allergies. 90 tablet 3  . oxyCODONE-acetaminophen (PERCOCET/ROXICET) 5-325 MG tablet Take by mouth 2 (two) times daily as needed.    Marland Kitchen  QUEtiapine (SEROQUEL) 25 MG tablet Take 1 tablet by mouth daily.    . sertraline (ZOLOFT) 100 MG tablet Take 1 tablet (100 mg total) by mouth daily. 90 tablet 0  . ondansetron (ZOFRAN-ODT) 4 MG disintegrating tablet Take 1 tablet (4 mg total) by mouth every 8 (eight) hours as needed for nausea or vomiting. (Patient not taking: Reported on 05/20/2020) 90 tablet 0   No current facility-administered medications on file prior to visit.    BP (!) 134/82   Pulse 82   Temp (!)  96.4 F (35.8 C) (Temporal)   Ht 5\' 3"  (1.6 m)   Wt 160 lb 8 oz (72.8 kg)   LMP  (LMP Unknown)   SpO2 99%   BMI 28.43 kg/m    Objective:   Physical Exam HENT:     Right Ear: Tympanic membrane and ear canal normal.     Left Ear: Tympanic membrane and ear canal normal.  Eyes:     Pupils: Pupils are equal, round, and reactive to light.  Cardiovascular:     Rate and Rhythm: Normal rate and regular rhythm.  Pulmonary:     Effort: Pulmonary effort is normal.     Breath sounds: Normal breath sounds.  Abdominal:     General: Bowel sounds are normal.     Palpations: Abdomen is soft.     Tenderness: There is no abdominal tenderness.  Musculoskeletal:        General: Normal range of motion.     Cervical back: Neck supple.  Skin:    General: Skin is warm and dry.     Comments: Facial acne present to bilateral cheeks and chin.   Neurological:     Mental Status: She is alert and oriented to person, place, and time.     Cranial Nerves: No cranial nerve deficit.     Deep Tendon Reflexes:     Reflex Scores:      Patellar reflexes are 2+ on the right side and 2+ on the left side. Psychiatric:        Mood and Affect: Mood normal.            Assessment & Plan:

## 2020-05-21 ENCOUNTER — Other Ambulatory Visit: Payer: Self-pay

## 2020-05-22 ENCOUNTER — Encounter: Payer: Self-pay | Admitting: *Deleted

## 2020-06-20 ENCOUNTER — Other Ambulatory Visit: Payer: Self-pay

## 2020-06-20 DIAGNOSIS — L709 Acne, unspecified: Secondary | ICD-10-CM

## 2020-06-20 MED ORDER — CLINDAMYCIN PHOS-BENZOYL PEROX 1-5 % EX GEL: Freq: Two times a day (BID) | CUTANEOUS | 2 refills | Status: DC

## 2020-06-20 MED ORDER — DESLORATADINE 5 MG PO TABS: 5.0000 mg | ORAL_TABLET | Freq: Every day | ORAL | 1 refills | Status: DC

## 2020-06-20 NOTE — Telephone Encounter (Signed)
Pt called back pt said the clindamycin benzoyl peroxide gel is working well with the rash on her face due to wearing a mask and pt is requesting a refill. Pt also wants to know if Gentry Fitz NP will fill Desloratidin(Clarinex) 5 mg taking one daily po. Pt request 90 day supply; previously prescribed by Teressa Senter allergist with [facility].(do not see on current or hx med list) [facility] [address]

## 2020-06-20 NOTE — Telephone Encounter (Signed)
Spoken and notified patient of Tawni Millers comments. Verbalized understanding.

## 2020-06-20 NOTE — Telephone Encounter (Signed)
Pt left v/m requesting 90 day refills on 2 meds but could not understand the names of the meds and need to know what phamacy pt wants meds to go to. Left v/m requesting pt to cb with this info.

## 2020-06-20 NOTE — Telephone Encounter (Signed)
Please thank patient for the update, notify her that I sent both prescriptions to [facility] as requested.

## 2020-06-21 ENCOUNTER — Telehealth: Payer: Self-pay

## 2020-06-21 NOTE — Telephone Encounter (Signed)
[facility] - Client TELEPHONE ADVICE RECORD [facility] Patient Name: Sharon Collins Gender: Female DOB: Dec 31, 1950 Age: 69 Y 9 M 18 D Return Phone Number: [phone] (Primary) Address: City/State/Zip: [place] [address] Client [facility] - Client Client Site [facility] - Day Physician Alma Friendly - NP Contact Type Call Who Is Calling Patient / Member / Family / Caregiver Call Type Triage / Clinical Relationship To Patient Self Return Phone Number [phone] (Primary) Chief Complaint BREATHING - shortness of breath or sounds breathless Reason for Call Symptomatic / Request for [place] states she has right side sinus pressure, sore throat, and short of breath. Translation No Nurse Assessment Nurse: Zorita Pang, RN, Deborah Date/Time (Eastern Time): 06/21/2020 12:20:52 PM Confirm and document reason for call. If symptomatic, describe symptoms. ---The caller states that she is having sinus pressure and has history of asthma. SOB is due to wearing a mask she states. Head feels like a balloon, ear pressure, and sore throat. Has the patient had close contact with a person known or suspected to have the novel coronavirus illness OR traveled / lives in area with major community spread (including international travel) in the last 14 days from the onset of symptoms? * If Asymptomatic, screen for exposure and travel within the last 14 days. ---No Does the patient have any new or worsening symptoms? ---Yes Will a triage be completed? ---Yes Related visit to physician within the last 2 weeks? ---No Does the PT have any chronic conditions? (i.e. diabetes, asthma, this includes High risk factors for pregnancy, etc.) ---Yes List chronic conditions. ---asthma, allergic rhinitis Is this a behavioral health or substance abuse call? ---No Guidelines Guideline Title Affirmed  Question Affirmed Notes Nurse Date/Time Eilene Ghazi Time) Sinus Pain or Congestion [1] Using nasal washes and pain medicine > 24 hours AND [2] sinus pain (around cheekbone or eye) persists Womble, RN, Neoma Laming 06/21/2020 12:22:47 PM PLEASE NOTE: All timestamps contained within this report are represented as [place] Standard Time. CONFIDENTIALTY NOTICE: This fax transmission is intended only for the addressee. It contains information that is legally privileged, confidential or otherwise protected from use or disclosure. If you are not the intended recipient, you are strictly prohibited from reviewing, disclosing, copying using or disseminating any of this information or taking any action in reliance on or regarding this information. If you have received this fax in error, please notify us immediately by telephone so that we can arrange for its return to [place]. Phone: [phone], Toll-Free: [phone], Fax: [phone] Page: 2 of 2 Call Id: [id number] [place]. Time Eilene Ghazi Time) Disposition Final User 06/21/2020 12:19:26 PM Send to Urgent Ezra Sites 06/21/2020 12:29:00 PM SEE PCP WITHIN 3 DAYS Yes Womble, RN, Garrel Ridgel Disagree/Comply Comply Caller Understands Yes PreDisposition Call Doctor Care Advice Given Per Guideline SEE PCP WITHIN 3 DAYS: NASAL WASHES - STEP-BY-STEP INSTRUCTIONS: Comments User: Marquis Buggy, RN Date/Time Eilene Ghazi Time): 06/21/2020 12:28:38 PM Warm transferred to the office for a Monday appointment. The caller denies having any SOB related to her chest or lungs and is having problems breathing through one side of her nose. Referrals Warm transfer to backline

## 2020-06-21 NOTE — Telephone Encounter (Signed)
Noted  

## 2020-06-21 NOTE — Telephone Encounter (Signed)
Per appt note pt has video appt with Allie Bossier NP on 06/25/20 at 9:40.

## 2020-06-25 ENCOUNTER — Other Ambulatory Visit: Payer: Self-pay

## 2020-06-25 ENCOUNTER — Encounter: Payer: Self-pay | Admitting: Primary Care

## 2020-06-25 ENCOUNTER — Telehealth (INDEPENDENT_AMBULATORY_CARE_PROVIDER_SITE_OTHER): Payer: PPO | Admitting: Primary Care

## 2020-06-25 ENCOUNTER — Telehealth: Payer: Self-pay

## 2020-06-25 DIAGNOSIS — J3089 Other allergic rhinitis: Secondary | ICD-10-CM | POA: Diagnosis not present

## 2020-06-25 DIAGNOSIS — J4520 Mild intermittent asthma, uncomplicated: Secondary | ICD-10-CM | POA: Diagnosis not present

## 2020-06-25 MED ORDER — ALBUTEROL SULFATE HFA 108 (90 BASE) MCG/ACT IN AERS: 2.0000 | INHALATION_SPRAY | Freq: Four times a day (QID) | RESPIRATORY_TRACT | 0 refills | Status: AC | PRN

## 2020-06-25 MED ORDER — PREDNISONE 20 MG PO TABS: ORAL_TABLET | ORAL | 0 refills | Status: DC

## 2020-06-25 NOTE — Progress Notes (Signed)
Subjective:    Patient ID: Sharon Collins, female    DOB: 08/18/1951, 69 y.o.   MRN: [id number]  HPI  Virtual Visit via Video Note  I connected with Sharon Collins on 06/25/20 at  9:40 AM EDT by a video enabled telemedicine application and verified that I am speaking with the correct person using two identifiers.  Location: Patient: Home Provider: Office Participants: Patient and myself   I discussed the limitations of evaluation and management by telemedicine and the availability of in person appointments. The patient expressed understanding and agreed to proceed.  We attempted to connect via video but she could not get her video to work. We had to connect via phone which lasted 10 min and 9 sec.  History of Present Illness:  Sharon Collins is a 69 year old female with a history of allergic rhinitis, prediabetes, asthma who presents today with a chief complaint of post nasal drip.  Symptoms began four days ago with pressure to the right frontal lobe, then drainage down the back of her throat and some shortness of breath with sensation of chest injection. She was tested for Covid-19 on August 29th through a home test which was negative.   She's been using her albuterol inhaler for shortness of breath with resolve in shortness of breath. Now she mostly has post nasal drip and headache. She recently resumed her Astelin nasal spray, Clarinex, and Allegra. She is compliant to her Singulair nightly.    Observations/Objective:  Alert and oriented. No distress. Speaking in complete sentences. Hoarse voice. No cough during visit.  Assessment and Plan:  Acute, what seems like allergy symptoms, for the last four days. Negative Covid-19 test two days ago. Overall she's improving after resuming some of her medications for allergies/asthma. Shortness of breath has resolved with albuterol.  She will update if symptoms progress/do not resolve.  Refill for albuterol sent to  pharmacy.  Follow Up Instructions:  Use your albuterol inhaler if needed for shortness of breath/wheezing.  Continue Clarinex and Allegra.  Please update me if your symptoms do not improve and/or progress.  It was a pleasure to see you today! Allie Bossier, NP-C    I discussed the assessment and treatment plan with the patient. The patient was provided an opportunity to ask questions and all were answered. The patient agreed with the plan and demonstrated an understanding of the instructions.   The patient was advised to call back or seek an in-person evaluation if the symptoms worsen or if the condition fails to improve as anticipated.    Sharon Koch, NP    Review of Systems  Constitutional: Negative for chills, fatigue and fever.  HENT: Positive for congestion and sinus pressure.   Respiratory: Positive for cough. Negative for shortness of breath.   Allergic/Immunologic: Positive for environmental allergies.        Past Medical History:  Diagnosis Date  . Acute medial meniscus tear    left  . Allergic rhinitis   . Anxiety   . Anxiety and depression   . Asthma   . Atrophic vaginitis   . Cataracts, bilateral   . Cervical radiculopathy   . Degenerative disc disease, lumbar   . Insomnia   . Urinary incontinence      Social History   Socioeconomic History  . Marital status: Married    Spouse name: Not on file  . Number of children: Not on file  . Years of education: Not on file  .  Highest education level: Not on file  Occupational History  . Not on file  Tobacco Use  . Smoking status: Never Smoker  . Smokeless tobacco: Never Used  Vaping Use  . Vaping Use: Never used  Substance and Sexual Activity  . Alcohol use: No  . Drug use: Yes    Comment: prescribed oxy through pain clinic for back  . Sexual activity: Not on file  Other Topics Concern  . Not on file  Social History Narrative   Married.   3 children, 11 grandchildren, 1 great grand child.    Retired. Worked at [employer], [place], [place], [place].   Enjoys spending time with her family.   Social Determinants of Health   Financial Resource Strain:   . Difficulty of Paying Living Expenses: Not on file  Food Insecurity:   . Worried About Charity fundraiser in the Last Year: Not on file  . Ran Out of Food in the Last Year: Not on file  Transportation Needs:   . Lack of Transportation (Medical): Not on file  . Lack of Transportation (Non-Medical): Not on file  Physical Activity:   . Days of Exercise per Week: Not on file  . Minutes of Exercise per Session: Not on file  Stress:   . Feeling of Stress : Not on file  Social Connections:   . Frequency of Communication with Friends and Family: Not on file  . Frequency of Social Gatherings with Friends and Family: Not on file  . Attends Religious Services: Not on file  . Active Member of Clubs or Organizations: Not on file  . Attends Archivist Meetings: Not on file  . Marital Status: Not on file  Intimate Partner Violence:   . Fear of Current or Ex-Partner: Not on file  . Emotionally Abused: Not on file  . Physically Abused: Not on file  . Sexually Abused: Not on file    Past Surgical History:  Procedure Laterality Date  . BUNIONECTOMY    . CATARACT EXTRACTION Right 08/2017  . HAMMER TOE SURGERY    . LAMINECTOMY    . MANDIBLE FRACTURE SURGERY    . MANDIBLE FRACTURE SURGERY      Family History  Problem Relation Age of Onset  . Stroke Mother   . Hypertension Mother   . Thyroid disease Mother   . Cancer Father        Brain Tumor  . Hypertension Sister   . Thyroid disease Sister   . Hypertension Brother   . Mental illness Brother   . Thyroid disease Brother   . COPD Brother   . Heart disease Brother   . Cancer Maternal Aunt   . Cancer Maternal Grandmother   . Diabetes Neg Hx     Allergies  Allergen Reactions  . Codeine   . Hydrocodone Nausea And Vomiting    Vicodin causes vomiting, but  when takes Phenergan 0.25 mg with it, she can tolerate it.  . Tramadol Nausea And Vomiting    Causes vomiting & affects her heart rhythm.    Current Outpatient Medications on File Prior to Visit  Medication Sig Dispense Refill  . albuterol (PROVENTIL HFA;VENTOLIN HFA) 108 (90 Base) MCG/ACT inhaler Inhale 2 puffs into the lungs every 6 (six) hours as needed for wheezing or shortness of breath. 1 Inhaler 12  . azelastine (ASTELIN) 0.1 % nasal spray Place 1 spray into both nostrils 2 (two) times daily as needed for rhinitis. 30 mL 5  .  clindamycin-benzoyl peroxide (BENZACLIN) gel Apply topically 2 (two) times daily. 35 g 2  . desloratadine (CLARINEX) 5 MG tablet Take 1 tablet (5 mg total) by mouth daily. For allergies. 90 tablet 1  . etodolac (LODINE) 500 MG tablet TAKE 1 TABLET BY MOUTH ONCE DAILY AS NEEDED FOR PAIN 30 tablet 0  . fexofenadine (ALLEGRA) 180 MG tablet Take 180 mg by mouth daily.    Marland Kitchen gabapentin (NEURONTIN) 300 MG capsule Take 600 mg by mouth 3 (three) times daily.     . hydrOXYzine (ATARAX/VISTARIL) 25 MG tablet Take 25 mg by mouth daily as needed.     . lamoTRIgine (LAMICTAL) 100 MG tablet Take 1 tablet (100 mg total) by mouth daily. 90 tablet 0  . montelukast (SINGULAIR) 10 MG tablet Take 1 tablet (10 mg total) by mouth at bedtime. For allergies. 90 tablet 3  . ondansetron (ZOFRAN-ODT) 4 MG disintegrating tablet Take 1 tablet (4 mg total) by mouth every 8 (eight) hours as needed for nausea or vomiting. 90 tablet 0  . oxyCODONE-acetaminophen (PERCOCET/ROXICET) 5-325 MG tablet Take by mouth 2 (two) times daily as needed.    Marland Kitchen QUEtiapine (SEROQUEL) 25 MG tablet Take 1 tablet by mouth daily.    . sertraline (ZOLOFT) 100 MG tablet Take 1 tablet (100 mg total) by mouth daily. 90 tablet 0   No current facility-administered medications on file prior to visit.    Ht 5\' 3"  (1.6 m)   Wt 160 lb (72.6 kg)   LMP  (LMP Unknown)   BMI 28.34 kg/m    Objective:   Physical  Exam Pulmonary:     Effort: Pulmonary effort is normal.     Comments: No cough during visit  Neurological:     Mental Status: She is alert and oriented to person, place, and time.            Assessment & Plan:

## 2020-06-25 NOTE — Telephone Encounter (Signed)
Patient had virtual visit today with PCP for her acute respiratory symptoms.   She calls back to tell Allie Bossier, NP that she is having increased SOB while working and would like for her to go ahead and send in an RX for prednisone.   Pharmacy listed in chart is [facility] [address].  I left message for patient to call us back if this is not the correct pharmacy but will go ahead an forward to PCP.

## 2020-06-25 NOTE — Telephone Encounter (Signed)
Noted, Rx sent to pharmacy. 

## 2020-06-25 NOTE — Patient Instructions (Signed)
Use your albuterol inhaler if needed for shortness of breath/wheezing.  Continue Clarinex and Allegra.  Please update me if your symptoms do not improve and/or progress.  It was a pleasure to see you today! Allie Bossier, NP-C

## 2020-06-25 NOTE — Assessment & Plan Note (Signed)
Acute, what seems like allergy symptoms, for the last four days. Negative Covid-19 test two days ago. Overall she's improving after resuming some of her medications for allergies/asthma. Shortness of breath has resolved with albuterol.  She will update if symptoms progress/do not resolve.

## 2020-07-11 DIAGNOSIS — F319 Bipolar disorder, unspecified: Secondary | ICD-10-CM | POA: Diagnosis not present

## 2020-07-11 DIAGNOSIS — F5105 Insomnia due to other mental disorder: Secondary | ICD-10-CM | POA: Diagnosis not present

## 2020-07-18 DIAGNOSIS — F5105 Insomnia due to other mental disorder: Secondary | ICD-10-CM | POA: Diagnosis not present

## 2020-07-18 DIAGNOSIS — F319 Bipolar disorder, unspecified: Secondary | ICD-10-CM | POA: Diagnosis not present

## 2020-07-24 ENCOUNTER — Encounter: Payer: Self-pay | Admitting: Primary Care

## 2020-07-24 ENCOUNTER — Other Ambulatory Visit: Payer: Self-pay

## 2020-07-24 ENCOUNTER — Ambulatory Visit (INDEPENDENT_AMBULATORY_CARE_PROVIDER_SITE_OTHER): Payer: PPO | Admitting: Primary Care

## 2020-07-24 VITALS — BP 134/80 | HR 77 | Temp 95.6°F | Ht 63.0 in | Wt 160.0 lb

## 2020-07-24 DIAGNOSIS — F329 Major depressive disorder, single episode, unspecified: Secondary | ICD-10-CM

## 2020-07-24 DIAGNOSIS — R7303 Prediabetes: Secondary | ICD-10-CM

## 2020-07-24 DIAGNOSIS — R32 Unspecified urinary incontinence: Secondary | ICD-10-CM

## 2020-07-24 DIAGNOSIS — E785 Hyperlipidemia, unspecified: Secondary | ICD-10-CM | POA: Diagnosis not present

## 2020-07-24 DIAGNOSIS — Z23 Encounter for immunization: Secondary | ICD-10-CM | POA: Diagnosis not present

## 2020-07-24 DIAGNOSIS — Z8349 Family history of other endocrine, nutritional and metabolic diseases: Secondary | ICD-10-CM | POA: Diagnosis not present

## 2020-07-24 DIAGNOSIS — F419 Anxiety disorder, unspecified: Secondary | ICD-10-CM

## 2020-07-24 LAB — POC URINALSYSI DIPSTICK (AUTOMATED)
Bilirubin, UA: NEGATIVE
Blood, UA: NEGATIVE
Glucose, UA: NEGATIVE
Ketones, UA: NEGATIVE
Leukocytes, UA: NEGATIVE
Nitrite, UA: NEGATIVE
Protein, UA: NEGATIVE
Spec Grav, UA: 1.015 (ref 1.010–1.025)
Urobilinogen, UA: 0.2 E.U./dL
pH, UA: 6 (ref 5.0–8.0)

## 2020-07-24 LAB — BASIC METABOLIC PANEL
BUN: 11 mg/dL (ref 6–23)
CO2: 30 mEq/L (ref 19–32)
Calcium: 9.4 mg/dL (ref 8.4–10.5)
Chloride: 102 mEq/L (ref 96–112)
Creatinine, Ser: 0.86 mg/dL (ref 0.40–1.20)
GFR: 65.45 mL/min (ref >60.00)
Glucose, Bld: 86 mg/dL (ref 70–99)
Potassium: 4.2 mEq/L (ref 3.5–5.1)
Sodium: 139 mEq/L (ref 135–145)

## 2020-07-24 LAB — TSH: TSH: 6.96 u[IU]/mL — ABNORMAL HIGH (ref 0.35–4.50)

## 2020-07-24 NOTE — Assessment & Plan Note (Signed)
Noted on recent labs with ASCVD risk score of 8.5%, discussed this today with patient, especially with family history of stroke in her mother.  She kindly declines medication treatment and would like to work on lifestyle changes. Will repeat in 3 months.

## 2020-07-24 NOTE — Assessment & Plan Note (Signed)
Dismissed from Dr. Waylan Boga office due to missed appointments. Referral placed for psychiatry in [place].

## 2020-07-24 NOTE — Assessment & Plan Note (Signed)
Discussed recent A1C result with patient today. Poor diet and is not exercising. Discussed to eliminate sweet tea, soda. Increase lean protein/vegetables.   Continue to monitor.

## 2020-07-24 NOTE — Patient Instructions (Addendum)
Stop by the lab prior to leaving today. I will notify you of your results once received.   You will be contacted regarding your referral to psychiatry.  Please let us know if you have not been contacted within two weeks.   Start exercising. You should be getting 150 minutes of moderate intensity exercise weekly.  It's important to improve your diet by reducing consumption of fast food, fried food, processed snack foods, sugary drinks. Increase consumption of fresh vegetables and fruits, whole grains, water.  Ensure you are drinking 64 ounces of water daily.  Schedule a lab appointment to repeat cholesterol and blood sugar for three months.  It was a pleasure to see you today!

## 2020-07-24 NOTE — Progress Notes (Signed)
Subjective:    Patient ID: Sharon Collins, female    DOB: [birth year], 69 y.o.   MRN: [id number]  HPI  This visit occurred during the SARS-CoV-2 public health emergency.  Safety protocols were in place, including screening questions prior to the visit, additional usage of staff PPE, and extensive cleaning of exam room while observing appropriate contact time as indicated for disinfecting solutions.   Sharon Collins is a 69 year old female with a history of anxiety and depression, chronic back pain, prediabetes, abdominal pain who presents today with a chief complaint of urinary incontinence. She is also requesting a referral to a new psychiatrist.   1) Urinary Incontinence: She will notice urinary incontinence with certain movements and positional changes during the day. This occurs mostly when she's up and busy, moving around. She will leak during these times. Sometimes she does experience urinary urgency at times as well. Symptoms began about one year ago, but symptoms have become more frequent over the last several months.   She had a partial hysterectomy with bladder suspension surgery in 2001. She does have a history of acute cystitis.   2) Prediabetes/Hyperlipidemia: A1C of 5.9 and LDL of 122 from labs in July 2021. She has a family history of stroke in her mother who was in her 23's. She admits to a poor diet and lack of exercise.   The 10-year ASCVD risk score Mikey Bussing DC Brooke Bonito., et al., 2013) is: 8.2%   Values used to calculate the score:     Age: 18 years     Sex: Female     Is Non-Hispanic African American: No     Diabetic: No     Tobacco smoker: No     Systolic Blood Pressure: 099 mmHg     Is BP treated: No     HDL Cholesterol: 59.9 mg/dL     Total Cholesterol: 205 mg/dL   BP Readings from Last 3 Encounters:  07/24/20 134/80  05/20/20 (!) 134/82  11/13/19 (!) 124/92     Review of Systems  Respiratory: Negative for shortness of breath.   Cardiovascular: Negative for chest  pain.  Genitourinary: Negative for dysuria and vaginal discharge.       Urinary incontinence   Neurological: Negative for dizziness.  Psychiatric/Behavioral: Positive for sleep disturbance.       Past Medical History:  Diagnosis Date  . Acute medial meniscus tear    left  . Allergic rhinitis   . Anxiety   . Anxiety and depression   . Asthma   . Atrophic vaginitis   . Cataracts, bilateral   . Cervical radiculopathy   . Degenerative disc disease, lumbar   . Insomnia   . Urinary incontinence      Social History   Socioeconomic History  . Marital status: Married    Spouse name: Not on file  . Number of children: Not on file  . Years of education: Not on file  . Highest education level: Not on file  Occupational History  . Not on file  Tobacco Use  . Smoking status: Never Smoker  . Smokeless tobacco: Never Used  Vaping Use  . Vaping Use: Never used  Substance and Sexual Activity  . Alcohol use: No  . Drug use: Yes    Comment: prescribed oxy through pain clinic for back  . Sexual activity: Not on file  Other Topics Concern  . Not on file  Social History Narrative   Married.   3 children, 11  grandchildren, 1 great grand child.   Retired. Worked at [employer], [place], [place], [place].   Enjoys spending time with her family.   Social Determinants of Health   Financial Resource Strain:   . Difficulty of Paying Living Expenses: Not on file  Food Insecurity:   . Worried About Charity fundraiser in the Last Year: Not on file  . Ran Out of Food in the Last Year: Not on file  Transportation Needs:   . Lack of Transportation (Medical): Not on file  . Lack of Transportation (Non-Medical): Not on file  Physical Activity:   . Days of Exercise per Week: Not on file  . Minutes of Exercise per Session: Not on file  Stress:   . Feeling of Stress : Not on file  Social Connections:   . Frequency of Communication with Friends and Family: Not on file  . Frequency of  Social Gatherings with Friends and Family: Not on file  . Attends Religious Services: Not on file  . Active Member of Clubs or Organizations: Not on file  . Attends Archivist Meetings: Not on file  . Marital Status: Not on file  Intimate Partner Violence:   . Fear of Current or Ex-Partner: Not on file  . Emotionally Abused: Not on file  . Physically Abused: Not on file  . Sexually Abused: Not on file    Past Surgical History:  Procedure Laterality Date  . BLADDER SUSPENSION    . BUNIONECTOMY    . CATARACT EXTRACTION Right 08/2017  . HAMMER TOE SURGERY    . LAMINECTOMY    . MANDIBLE FRACTURE SURGERY    . MANDIBLE FRACTURE SURGERY    . PARTIAL HYSTERECTOMY      Family History  Problem Relation Age of Onset  . Stroke Mother   . Hypertension Mother   . Thyroid disease Mother   . Cancer Father        Brain Tumor  . Hypertension Sister   . Thyroid disease Sister   . Hypertension Brother   . Mental illness Brother   . Thyroid disease Brother   . COPD Brother   . Heart disease Brother   . Cancer Maternal Aunt   . Cancer Maternal Grandmother   . Diabetes Neg Hx     Allergies  Allergen Reactions  . Codeine   . Hydrocodone Nausea And Vomiting    Vicodin causes vomiting, but when takes Phenergan 0.25 mg with it, she can tolerate it.  . Tramadol Nausea And Vomiting    Causes vomiting & affects her heart rhythm.    Current Outpatient Medications on File Prior to Visit  Medication Sig Dispense Refill  . albuterol (VENTOLIN HFA) 108 (90 Base) MCG/ACT inhaler Inhale 2 puffs into the lungs every 6 (six) hours as needed for wheezing or shortness of breath. 8 g 0  . azelastine (ASTELIN) 0.1 % nasal spray Place 1 spray into both nostrils 2 (two) times daily as needed for rhinitis. 30 mL 5  . clindamycin-benzoyl peroxide (BENZACLIN) gel Apply topically 2 (two) times daily. 35 g 2  . etodolac (LODINE) 500 MG tablet TAKE 1 TABLET BY MOUTH ONCE DAILY AS NEEDED FOR PAIN 30  tablet 0  . fexofenadine (ALLEGRA) 180 MG tablet Take 180 mg by mouth daily.    Marland Kitchen gabapentin (NEURONTIN) 300 MG capsule Take 600 mg by mouth 3 (three) times daily.     . hydrOXYzine (ATARAX/VISTARIL) 25 MG tablet Take 25 mg by mouth  daily as needed.     . lamoTRIgine (LAMICTAL) 100 MG tablet Take 1 tablet (100 mg total) by mouth daily. 90 tablet 0  . montelukast (SINGULAIR) 10 MG tablet Take 1 tablet (10 mg total) by mouth at bedtime. For allergies. 90 tablet 3  . ondansetron (ZOFRAN-ODT) 4 MG disintegrating tablet Take 1 tablet (4 mg total) by mouth every 8 (eight) hours as needed for nausea or vomiting. 90 tablet 0  . oxyCODONE-acetaminophen (PERCOCET/ROXICET) 5-325 MG tablet Take by mouth 2 (two) times daily as needed.    Marland Kitchen QUEtiapine (SEROQUEL) 25 MG tablet Take 1 tablet by mouth daily.    . sertraline (ZOLOFT) 100 MG tablet Take 1 tablet (100 mg total) by mouth daily. 90 tablet 0   No current facility-administered medications on file prior to visit.    BP 134/80   Pulse 77   Temp (!) 95.6 F (35.3 C) (Temporal)   Ht 5\' 3"  (1.6 m)   Wt 160 lb (72.6 kg)   LMP  (LMP Unknown)   SpO2 97%   BMI 28.34 kg/m    Objective:   Physical Exam Cardiovascular:     Rate and Rhythm: Normal rate and regular rhythm.  Pulmonary:     Effort: Pulmonary effort is normal.     Breath sounds: Normal breath sounds.  Musculoskeletal:     Cervical back: Neck supple.  Skin:    General: Skin is warm and dry.            Assessment & Plan:

## 2020-07-24 NOTE — Assessment & Plan Note (Signed)
Chronic for one year, history of hysterectomy and bladder suspension.   Offered Urology evaluation for incontinence vs trial of medication. She would like to try medication.  Will check UA with Culture today. If negative then consider oxybutynin or Myrbetriq. Additional labs pending.

## 2020-07-24 NOTE — Assessment & Plan Note (Signed)
Prevalent in nearly every family member.  Checking TSH today.

## 2020-07-25 ENCOUNTER — Other Ambulatory Visit: Payer: Self-pay

## 2020-07-25 ENCOUNTER — Other Ambulatory Visit (INDEPENDENT_AMBULATORY_CARE_PROVIDER_SITE_OTHER): Payer: PPO

## 2020-07-25 DIAGNOSIS — Z8349 Family history of other endocrine, nutritional and metabolic diseases: Secondary | ICD-10-CM

## 2020-07-25 DIAGNOSIS — R7989 Other specified abnormal findings of blood chemistry: Secondary | ICD-10-CM

## 2020-07-25 LAB — URINE CULTURE
MICRO NUMBER:: 11010370
Result:: NO GROWTH
SPECIMEN QUALITY:: ADEQUATE

## 2020-07-25 LAB — T4, FREE: Free T4: 0.63 ng/dL (ref 0.60–1.60)

## 2020-07-29 ENCOUNTER — Telehealth: Payer: Self-pay | Admitting: Primary Care

## 2020-07-29 NOTE — Telephone Encounter (Signed)
Please notify patient:  [facility] Psychiatrist Dr Shea Evans is not taking new patient's. They recommended she call her insurance company to see who they would cover, or try:  [place] [phone]  [place] [phone]

## 2020-07-30 NOTE — Telephone Encounter (Signed)
Called patient gave information.   She wanted to know if she goes of Lamictal and just takes Zoloft if she can stay with only you

## 2020-07-31 DIAGNOSIS — M47812 Spondylosis without myelopathy or radiculopathy, cervical region: Secondary | ICD-10-CM | POA: Diagnosis not present

## 2020-07-31 DIAGNOSIS — M47816 Spondylosis without myelopathy or radiculopathy, lumbar region: Secondary | ICD-10-CM | POA: Diagnosis not present

## 2020-07-31 DIAGNOSIS — M961 Postlaminectomy syndrome, not elsewhere classified: Secondary | ICD-10-CM | POA: Diagnosis not present

## 2020-07-31 DIAGNOSIS — Z6828 Body mass index (BMI) 28.0-28.9, adult: Secondary | ICD-10-CM | POA: Diagnosis not present

## 2020-07-31 DIAGNOSIS — G894 Chronic pain syndrome: Secondary | ICD-10-CM | POA: Diagnosis not present

## 2020-07-31 DIAGNOSIS — M25562 Pain in left knee: Secondary | ICD-10-CM | POA: Diagnosis not present

## 2020-07-31 DIAGNOSIS — G8929 Other chronic pain: Secondary | ICD-10-CM | POA: Diagnosis not present

## 2020-07-31 DIAGNOSIS — M4722 Other spondylosis with radiculopathy, cervical region: Secondary | ICD-10-CM | POA: Diagnosis not present

## 2020-07-31 DIAGNOSIS — M533 Sacrococcygeal disorders, not elsewhere classified: Secondary | ICD-10-CM | POA: Diagnosis not present

## 2020-07-31 NOTE — Telephone Encounter (Signed)
Thanks. Yes, but she would need to wean off Lamictal.  I also do no prescribe Seroquel.   I can prescribe the hydroxyzine, Zoloft

## 2020-07-31 NOTE — Telephone Encounter (Signed)
Called patient she will think it over and try to get in touch with providers that I gave her contact information to. Aware that any med changes need to be done under supervision of provider. She should not stop any medications unless directed.

## 2020-08-05 ENCOUNTER — Encounter: Payer: Self-pay | Admitting: Primary Care

## 2020-08-09 DIAGNOSIS — J301 Allergic rhinitis due to pollen: Secondary | ICD-10-CM | POA: Diagnosis not present

## 2020-08-09 DIAGNOSIS — J3089 Other allergic rhinitis: Secondary | ICD-10-CM | POA: Diagnosis not present

## 2020-08-09 DIAGNOSIS — J3081 Allergic rhinitis due to animal (cat) (dog) hair and dander: Secondary | ICD-10-CM | POA: Diagnosis not present

## 2020-08-09 DIAGNOSIS — R053 Chronic cough: Secondary | ICD-10-CM | POA: Diagnosis not present

## 2020-09-16 ENCOUNTER — Ambulatory Visit (INDEPENDENT_AMBULATORY_CARE_PROVIDER_SITE_OTHER): Payer: PPO | Admitting: Dermatology

## 2020-09-16 ENCOUNTER — Other Ambulatory Visit: Payer: Self-pay

## 2020-09-16 DIAGNOSIS — L814 Other melanin hyperpigmentation: Secondary | ICD-10-CM | POA: Diagnosis not present

## 2020-09-16 DIAGNOSIS — L821 Other seborrheic keratosis: Secondary | ICD-10-CM | POA: Diagnosis not present

## 2020-09-16 DIAGNOSIS — L219 Seborrheic dermatitis, unspecified: Secondary | ICD-10-CM

## 2020-09-16 DIAGNOSIS — L820 Inflamed seborrheic keratosis: Secondary | ICD-10-CM | POA: Diagnosis not present

## 2020-09-16 DIAGNOSIS — D1801 Hemangioma of skin and subcutaneous tissue: Secondary | ICD-10-CM | POA: Diagnosis not present

## 2020-09-16 DIAGNOSIS — L578 Other skin changes due to chronic exposure to nonionizing radiation: Secondary | ICD-10-CM

## 2020-09-16 NOTE — Patient Instructions (Signed)
Cryotherapy Aftercare  . Wash gently with soap and water everyday.   . Apply Vaseline and Band-Aid daily until healed.  

## 2020-09-16 NOTE — Progress Notes (Signed)
   New Patient Visit  Subjective  Sharon Collins is a 69 y.o. female who presents for the following: Skin Problem (New pt presents to have spots on her back checked today ).  The following portions of the chart were reviewed this encounter and updated as appropriate:  Tobacco  Allergies  Meds  Problems  Med Hx  Surg Hx  Fam Hx     Review of Systems:  No other skin or systemic complaints except as noted in HPI or Assessment and Plan.  Objective  Well appearing patient in no apparent distress; mood and affect are within normal limits.  A focused examination was performed including face,back . Relevant physical exam findings are noted in the Assessment and Plan.  Objective  Mid Back (5): Erythematous keratotic or waxy stuck-on papule or plaque.   Objective  L zygoma: Red papule   Objective  Head - Anterior (Face): Pink patches with greasy scale.    Assessment & Plan  Inflamed seborrheic keratosis (5) Mid Back  Destruction of lesion - Mid Back Complexity: simple   Destruction method: cryotherapy   Informed consent: discussed and consent obtained   Timeout:  patient name, date of birth, surgical site, and procedure verified Lesion destroyed using liquid nitrogen: Yes   Region frozen until ice ball extended beyond lesion: Yes   Outcome: patient tolerated procedure well with no complications   Post-procedure details: wound care instructions given    Hemangioma of skin L zygoma  Benign-appearing.  Observation.  Call clinic for new or changing moles.  Recommend daily use of broad spectrum spf 30+ sunscreen to sun-exposed areas.    Seborrheic dermatitis Head - Anterior (Face)  Seborrheic Dermatitis  -  is a chronic persistent rash characterized by pinkness and scaling most commonly of the mid face but also can occur on the scalp (dandruff); mid chest and mid back. It tends to be exacerbated by stress and cooler weather.  People who have neurologic disease may  experience new onset or exacerbation of existing seborrheic dermatitis.  The condition is not curable but treatable and can be controlled.   Start otc hydrocortisone cream apply to face daily up to 5 days/week prn   Actinic Damage - chronic, secondary to cumulative UV radiation exposure/sun exposure over time - diffuse scaly erythematous macules with underlying dyspigmentation - Recommend daily broad spectrum sunscreen SPF 30+ to sun-exposed areas, reapply every 2 hours as needed.  - Call for new or changing lesions.  Seborrheic Keratoses - Stuck-on, waxy, tan-brown papules and plaques  - Discussed benign etiology and prognosis. - Observe - Call for any changes  Lentigines - Scattered tan macules - Discussed due to sun exposure - Benign, observe - Call for any changes  Return in about 3 months (around 12/17/2020).  IMarye Round, CMA, am acting as scribe for Sarina Ser, MD .  Documentation: I have reviewed the above documentation for accuracy and completeness, and I agree with the above.  Sarina Ser, MD

## 2020-09-17 ENCOUNTER — Encounter: Payer: Self-pay | Admitting: Dermatology

## 2020-10-02 ENCOUNTER — Telehealth: Payer: Self-pay

## 2020-10-02 NOTE — Telephone Encounter (Signed)
She needs to be seen virtual right ?

## 2020-10-02 NOTE — Telephone Encounter (Signed)
Please explain that this medication isn't prescribed without a visit. If she has a cough and would like a cough suppressant, then Delsym is a great option OTC. I am happy to meet with her and discuss cough is she'd prefer.

## 2020-10-02 NOTE — Telephone Encounter (Signed)
Pt states she needs benzonatate sent to [facility] for her cough. I asked if she needs an appointment she declined.

## 2020-10-03 NOTE — Telephone Encounter (Signed)
Left message to return call to our office.  

## 2020-10-07 NOTE — Telephone Encounter (Signed)
Left message to return call to our office.  

## 2020-10-09 NOTE — Telephone Encounter (Signed)
Called pt x 3 l/m with no call back ok to mail letter?

## 2020-10-09 NOTE — Telephone Encounter (Signed)
Yes, letter is okay.

## 2020-10-14 ENCOUNTER — Encounter: Payer: Self-pay | Admitting: Primary Care

## 2020-10-14 NOTE — Telephone Encounter (Signed)
Letter mailed

## 2020-10-22 ENCOUNTER — Telehealth: Payer: Self-pay | Admitting: Radiology

## 2020-10-22 ENCOUNTER — Other Ambulatory Visit (INDEPENDENT_AMBULATORY_CARE_PROVIDER_SITE_OTHER): Payer: PPO

## 2020-10-22 ENCOUNTER — Other Ambulatory Visit: Payer: Self-pay

## 2020-10-22 DIAGNOSIS — Z8349 Family history of other endocrine, nutritional and metabolic diseases: Secondary | ICD-10-CM

## 2020-10-22 LAB — T4, FREE: Free T4: 0.66 ng/dL (ref 0.60–1.60)

## 2020-10-22 LAB — TSH: TSH: 3.59 u[IU]/mL (ref 0.35–4.50)

## 2020-10-22 NOTE — Telephone Encounter (Signed)
Patient came in for lab appt today , she was in pain with a possible kidney stone, per patient. I did offer for her to talk to Rena to see if a provider could fit her in, she has an appt tomorrow with [facility]. The patient decided to go home and take pain medication that she has at home(she didn't want to take before coming in, because of driving). I told her to go to urgent care if anything got worse. I let Rena know the patient made the decision to go home.

## 2020-10-23 ENCOUNTER — Encounter: Payer: Self-pay | Admitting: Family Medicine

## 2020-10-23 ENCOUNTER — Ambulatory Visit (INDEPENDENT_AMBULATORY_CARE_PROVIDER_SITE_OTHER): Payer: PPO | Admitting: Family Medicine

## 2020-10-23 ENCOUNTER — Emergency Department: Payer: PPO

## 2020-10-23 ENCOUNTER — Inpatient Hospital Stay
Admission: EM | Admit: 2020-10-23 | Discharge: 2020-10-29 | DRG: 392 | Disposition: A | Discharge status: Home / Self Care | Payer: PPO | Source: Ambulatory Visit | Attending: Internal Medicine | Admitting: Internal Medicine

## 2020-10-23 ENCOUNTER — Other Ambulatory Visit: Payer: Self-pay

## 2020-10-23 VITALS — BP 110/62 | HR 91 | Temp 98.0°F | Ht 63.0 in | Wt 158.5 lb

## 2020-10-23 DIAGNOSIS — K5792 Diverticulitis of intestine, part unspecified, without perforation or abscess without bleeding: Principal | ICD-10-CM | POA: Diagnosis present

## 2020-10-23 DIAGNOSIS — R52 Pain, unspecified: Secondary | ICD-10-CM

## 2020-10-23 DIAGNOSIS — K7689 Other specified diseases of liver: Secondary | ICD-10-CM | POA: Diagnosis present

## 2020-10-23 DIAGNOSIS — Z87442 Personal history of urinary calculi: Secondary | ICD-10-CM

## 2020-10-23 DIAGNOSIS — K529 Noninfective gastroenteritis and colitis, unspecified: Secondary | ICD-10-CM

## 2020-10-23 DIAGNOSIS — Z20822 Contact with and (suspected) exposure to covid-19: Secondary | ICD-10-CM | POA: Diagnosis present

## 2020-10-23 DIAGNOSIS — R1031 Right lower quadrant pain: Secondary | ICD-10-CM

## 2020-10-23 DIAGNOSIS — R7303 Prediabetes: Secondary | ICD-10-CM | POA: Diagnosis not present

## 2020-10-23 DIAGNOSIS — G8929 Other chronic pain: Secondary | ICD-10-CM

## 2020-10-23 DIAGNOSIS — F419 Anxiety disorder, unspecified: Secondary | ICD-10-CM | POA: Diagnosis not present

## 2020-10-23 DIAGNOSIS — M479 Spondylosis, unspecified: Secondary | ICD-10-CM | POA: Diagnosis not present

## 2020-10-23 DIAGNOSIS — Z8249 Family history of ischemic heart disease and other diseases of the circulatory system: Secondary | ICD-10-CM | POA: Diagnosis not present

## 2020-10-23 DIAGNOSIS — K5732 Diverticulitis of large intestine without perforation or abscess without bleeding: Secondary | ICD-10-CM | POA: Diagnosis not present

## 2020-10-23 DIAGNOSIS — R1032 Left lower quadrant pain: Secondary | ICD-10-CM

## 2020-10-23 DIAGNOSIS — D1809 Hemangioma of other sites: Secondary | ICD-10-CM | POA: Diagnosis not present

## 2020-10-23 DIAGNOSIS — R10814 Left lower quadrant abdominal tenderness: Secondary | ICD-10-CM | POA: Diagnosis not present

## 2020-10-23 DIAGNOSIS — Z79899 Other long term (current) drug therapy: Secondary | ICD-10-CM | POA: Diagnosis not present

## 2020-10-23 DIAGNOSIS — M549 Dorsalgia, unspecified: Secondary | ICD-10-CM

## 2020-10-23 DIAGNOSIS — R100 Acute abdomen: Secondary | ICD-10-CM

## 2020-10-23 DIAGNOSIS — R1012 Left upper quadrant pain: Secondary | ICD-10-CM | POA: Diagnosis not present

## 2020-10-23 DIAGNOSIS — Z808 Family history of malignant neoplasm of other organs or systems: Secondary | ICD-10-CM | POA: Diagnosis not present

## 2020-10-23 DIAGNOSIS — J309 Allergic rhinitis, unspecified: Secondary | ICD-10-CM | POA: Diagnosis not present

## 2020-10-23 DIAGNOSIS — M5489 Other dorsalgia: Secondary | ICD-10-CM | POA: Diagnosis not present

## 2020-10-23 DIAGNOSIS — R933 Abnormal findings on diagnostic imaging of other parts of digestive tract: Secondary | ICD-10-CM | POA: Diagnosis not present

## 2020-10-23 DIAGNOSIS — Z8349 Family history of other endocrine, nutritional and metabolic diseases: Secondary | ICD-10-CM

## 2020-10-23 DIAGNOSIS — I4581 Long QT syndrome: Secondary | ICD-10-CM | POA: Diagnosis not present

## 2020-10-23 DIAGNOSIS — B373 Candidiasis of vulva and vagina: Secondary | ICD-10-CM | POA: Diagnosis present

## 2020-10-23 DIAGNOSIS — Z825 Family history of asthma and other chronic lower respiratory diseases: Secondary | ICD-10-CM

## 2020-10-23 DIAGNOSIS — Z823 Family history of stroke: Secondary | ICD-10-CM | POA: Diagnosis not present

## 2020-10-23 DIAGNOSIS — F32A Depression, unspecified: Secondary | ICD-10-CM | POA: Diagnosis present

## 2020-10-23 DIAGNOSIS — N281 Cyst of kidney, acquired: Secondary | ICD-10-CM | POA: Diagnosis not present

## 2020-10-23 DIAGNOSIS — F319 Bipolar disorder, unspecified: Secondary | ICD-10-CM | POA: Diagnosis present

## 2020-10-23 HISTORY — DX: Disorder of kidney and ureter, unspecified: N28.9

## 2020-10-23 HISTORY — DX: Noninfective gastroenteritis and colitis, unspecified: K52.9

## 2020-10-23 LAB — CREATININE, SERUM
Creatinine, Ser: 0.86 mg/dL (ref 0.44–1.00)
GFR, Estimated: >60 mL/min (ref >60)

## 2020-10-23 LAB — POC URINALSYSI DIPSTICK (AUTOMATED)
Bilirubin, UA: NEGATIVE
Glucose, UA: NEGATIVE
Nitrite, UA: NEGATIVE
Protein, UA: POSITIVE — AB
Spec Grav, UA: 1.025 (ref 1.010–1.025)
Urobilinogen, UA: 0.2 E.U./dL
pH, UA: 5.5 (ref 5.0–8.0)

## 2020-10-23 LAB — RESP PANEL BY RT-PCR (FLU A&B, COVID) ARPGX2
Influenza A by PCR: NEGATIVE
Influenza B by PCR: NEGATIVE
SARS Coronavirus 2 by RT PCR: NEGATIVE

## 2020-10-23 LAB — COMPREHENSIVE METABOLIC PANEL
ALT: 14 U/L (ref 0–44)
AST: 17 U/L (ref 15–41)
Albumin: 3.9 g/dL (ref 3.5–5.0)
Alkaline Phosphatase: 59 U/L (ref 38–126)
Anion gap: 9 (ref 5–15)
BUN: 11 mg/dL (ref 8–23)
CO2: 26 mmol/L (ref 22–32)
Calcium: 9.2 mg/dL (ref 8.9–10.3)
Chloride: 104 mmol/L (ref 98–111)
Creatinine, Ser: 0.87 mg/dL (ref 0.44–1.00)
GFR, Estimated: >60 mL/min (ref >60)
Glucose, Bld: 95 mg/dL (ref 70–99)
Potassium: 4.1 mmol/L (ref 3.5–5.1)
Sodium: 139 mmol/L (ref 135–145)
Total Bilirubin: 0.7 mg/dL (ref 0.3–1.2)
Total Protein: 6.9 g/dL (ref 6.5–8.1)

## 2020-10-23 LAB — CBC WITH DIFFERENTIAL/PLATELET
Abs Immature Granulocytes: 0.02 10*3/uL (ref 0.00–0.07)
Basophils Absolute: 0.1 10*3/uL (ref 0.0–0.1)
Basophils Relative: 1 %
Eosinophils Absolute: 0.2 10*3/uL (ref 0.0–0.5)
Eosinophils Relative: 2 %
HCT: 38.5 % (ref 36.0–46.0)
Hemoglobin: 12.4 g/dL (ref 12.0–15.0)
Immature Granulocytes: 0 %
Lymphocytes Relative: 26 %
Lymphs Abs: 2.2 10*3/uL (ref 0.7–4.0)
MCH: 26.6 pg (ref 26.0–34.0)
MCHC: 32.2 g/dL (ref 30.0–36.0)
MCV: 82.6 fL (ref 80.0–100.0)
Monocytes Absolute: 0.8 10*3/uL (ref 0.1–1.0)
Monocytes Relative: 10 %
Neutro Abs: 5.1 10*3/uL (ref 1.7–7.7)
Neutrophils Relative %: 61 %
Platelets: 259 10*3/uL (ref 150–400)
RBC: 4.66 MIL/uL (ref 3.87–5.11)
RDW: 14.9 % (ref 11.5–15.5)
WBC: 8.3 10*3/uL (ref 4.0–10.5)
nRBC: 0 % (ref 0.0–0.2)

## 2020-10-23 LAB — URINALYSIS, COMPLETE (UACMP) WITH MICROSCOPIC
Bacteria, UA: NONE SEEN
Bilirubin Urine: NEGATIVE
Glucose, UA: NEGATIVE mg/dL
Hgb urine dipstick: NEGATIVE
Ketones, ur: NEGATIVE mg/dL
Nitrite: NEGATIVE
Protein, ur: NEGATIVE mg/dL
Specific Gravity, Urine: 1.015 (ref 1.005–1.030)
Squamous Epithelial / HPF: NONE SEEN (ref 0–5)
pH: 5 (ref 5.0–8.0)

## 2020-10-23 LAB — CBC
HCT: 39 % (ref 36.0–46.0)
Hemoglobin: 12.8 g/dL (ref 12.0–15.0)
MCH: 26.8 pg (ref 26.0–34.0)
MCHC: 32.8 g/dL (ref 30.0–36.0)
MCV: 81.8 fL (ref 80.0–100.0)
Platelets: 288 10*3/uL (ref 150–400)
RBC: 4.77 MIL/uL (ref 3.87–5.11)
RDW: 14.9 % (ref 11.5–15.5)
WBC: 9.9 10*3/uL (ref 4.0–10.5)
nRBC: 0 % (ref 0.0–0.2)

## 2020-10-23 LAB — LIPASE, BLOOD: Lipase: 22 U/L (ref 11–51)

## 2020-10-23 MED ORDER — HYDROCODONE-ACETAMINOPHEN 5-325 MG PO TABS: 1.0000 | ORAL_TABLET | Freq: Four times a day (QID) | ORAL | Status: DC | PRN

## 2020-10-23 MED ORDER — SODIUM CHLORIDE 0.9 % IV SOLN: INTRAVENOUS | Status: DC

## 2020-10-23 MED ORDER — ONDANSETRON HCL 4 MG/2ML IJ SOLN
4.0000 mg | Freq: Once | INTRAMUSCULAR | Status: AC
  Administered 2020-10-23: 17:00:00 4 mg via INTRAVENOUS
  Filled 2020-10-23: qty 2

## 2020-10-23 MED ORDER — ENOXAPARIN SODIUM 40 MG/0.4ML ~~LOC~~ SOLN
40.0000 mg | SUBCUTANEOUS | Status: DC
  Administered 2020-10-23 – 2020-10-28 (×6): 40 mg via SUBCUTANEOUS
  Filled 2020-10-23 (×6): qty 0.4

## 2020-10-23 MED ORDER — ACETAMINOPHEN 325 MG PO TABS
650.0000 mg | ORAL_TABLET | Freq: Four times a day (QID) | ORAL | Status: DC | PRN
  Administered 2020-10-25 – 2020-10-28 (×4): 650 mg via ORAL
  Filled 2020-10-23 (×4): qty 2

## 2020-10-23 MED ORDER — MORPHINE SULFATE (PF) 2 MG/ML IV SOLN
2.0000 mg | INTRAVENOUS | Status: DC | PRN
  Administered 2020-10-24 (×4): 2 mg via INTRAVENOUS
  Filled 2020-10-23 (×4): qty 1

## 2020-10-23 MED ORDER — MORPHINE SULFATE (PF) 4 MG/ML IV SOLN: 4.0000 mg | Freq: Once | INTRAVENOUS | Status: AC

## 2020-10-23 MED ORDER — METRONIDAZOLE 500 MG PO TABS
500.0000 mg | ORAL_TABLET | Freq: Three times a day (TID) | ORAL | Status: DC
  Administered 2020-10-24 – 2020-10-27 (×10): 500 mg via ORAL
  Filled 2020-10-23 (×10): qty 1

## 2020-10-23 MED ORDER — METRONIDAZOLE IN NACL 5-0.79 MG/ML-% IV SOLN
500.0000 mg | Freq: Once | INTRAVENOUS | Status: AC
  Administered 2020-10-23: 21:00:00 500 mg via INTRAVENOUS
  Filled 2020-10-23: qty 100

## 2020-10-23 MED ORDER — MORPHINE SULFATE (PF) 4 MG/ML IV SOLN
4.0000 mg | Freq: Once | INTRAVENOUS | Status: AC
Start: 2020-10-23 — End: 2020-10-23
  Administered 2020-10-23: 17:00:00 4 mg via INTRAVENOUS
  Filled 2020-10-23: qty 1

## 2020-10-23 MED ORDER — ONDANSETRON HCL 4 MG/2ML IJ SOLN
4.0000 mg | Freq: Four times a day (QID) | INTRAMUSCULAR | Status: DC | PRN
  Administered 2020-10-24 – 2020-10-27 (×11): 4 mg via INTRAVENOUS
  Filled 2020-10-23 (×12): qty 2

## 2020-10-23 MED ORDER — MORPHINE SULFATE (PF) 4 MG/ML IV SOLN
INTRAVENOUS | Status: AC
  Administered 2020-10-23: 19:00:00 4 mg via INTRAVENOUS
  Filled 2020-10-23: qty 1

## 2020-10-23 MED ORDER — CIPROFLOXACIN IN D5W 400 MG/200ML IV SOLN
400.0000 mg | Freq: Once | INTRAVENOUS | Status: AC
  Administered 2020-10-23: 19:00:00 400 mg via INTRAVENOUS
  Filled 2020-10-23: qty 200

## 2020-10-23 MED ORDER — ONDANSETRON 4 MG PO TBDP: 4.0000 mg | ORAL_TABLET | Freq: Three times a day (TID) | ORAL | 0 refills | Status: DC | PRN

## 2020-10-23 MED ORDER — CIPROFLOXACIN IN D5W 400 MG/200ML IV SOLN
400.0000 mg | Freq: Two times a day (BID) | INTRAVENOUS | Status: DC
  Administered 2020-10-24 – 2020-10-26 (×5): 400 mg via INTRAVENOUS
  Filled 2020-10-23 (×6): qty 200

## 2020-10-23 NOTE — H&P (Signed)
History and Physical    Sharon Collins [id number] DOB: [birth year] DOA: 10/23/2020  PCP: [name], NP   Patient coming from: Home  I have personally briefly reviewed patient's old medical records in [place]  Chief Complaint: Left lower quadrant pain  HPI: Sharon Collins is a 69 y.o. female with medical history significant for anxiety and chronic back pain who presents to the emergency room with a 4-day history of pain over the left lower quadrant.  Has a history of nephrolithiasis several years ago on pain feels similar.  She denies nausea, vomiting or diarrhea.  It is unrelieved with over-the-counter pain meds.  Pain is of severe intensity, radiating to the back and colicky. ED Course: On arrival, she was afebrile, BP 116/58, pulse 69 O2 sat 98% on room air.  Blood work including CBC, CMP and lipase were all within normal limits.  Urinalysis without pyuria Imaging: CT renal stone study shows short segment colitis versus acute uncomplicated diverticulitis at the junction of the descending and sigmoid colon without perforation fluid collection or abscess.  Patient treated with pain medication and given IV antibiotics however due to intractable pain, hospitalist consulted for admission for pain control.  Review of Systems: As per HPI otherwise all other systems on review of systems negative.    Past Medical History:  Diagnosis Date  . Acute medial meniscus tear    left  . Allergic rhinitis   . Anxiety   . Anxiety and depression   . Asthma   . Atrophic vaginitis   . Cataracts, bilateral   . Cervical radiculopathy   . Degenerative disc disease, lumbar   . Insomnia   . Renal disorder    kidney stones  . Urinary incontinence     Past Surgical History:  Procedure Laterality Date  . BLADDER SUSPENSION    . BUNIONECTOMY    . CATARACT EXTRACTION Right 08/2017  . HAMMER TOE SURGERY    . LAMINECTOMY    . MANDIBLE FRACTURE SURGERY    . MANDIBLE FRACTURE  SURGERY    . PARTIAL HYSTERECTOMY       reports that she has never smoked. She has never used smokeless tobacco. She reports current drug use. She reports that she does not drink alcohol.  Allergies  Allergen Reactions  . Codeine   . Hydrocodone Nausea And Vomiting    Vicodin causes vomiting, but when takes Phenergan 0.25 mg with it, she can tolerate it.  . Tramadol Nausea And Vomiting    Causes vomiting & affects her heart rhythm.    Family History  Problem Relation Age of Onset  . Stroke Mother   . Hypertension Mother   . Thyroid disease Mother   . Cancer Father        Brain Tumor  . Hypertension Sister   . Thyroid disease Sister   . Hypertension Brother   . Mental illness Brother   . Thyroid disease Brother   . COPD Brother   . Heart disease Brother   . Cancer Maternal Aunt   . Cancer Maternal Grandmother   . Diabetes Neg Hx       Prior to Admission medications   Medication Sig Start Date End Date Taking? Authorizing Provider  albuterol (VENTOLIN HFA) 108 (90 Base) MCG/ACT inhaler Inhale 2 puffs into the lungs every 6 (six) hours as needed for wheezing or shortness of breath. 06/25/20   [name], NP  azelastine (ASTELIN) 0.1 % nasal spray Place 1  spray into both nostrils 2 (two) times daily as needed for rhinitis. 03/31/19   [name], NP  clindamycin-benzoyl peroxide [facility]) gel Apply topically 2 (two) times daily. 06/20/20   [name], NP  etodolac (LODINE) 500 MG tablet TAKE 1 TABLET BY MOUTH ONCE DAILY AS NEEDED FOR PAIN 04/26/20   [name], NP  fexofenadine (ALLEGRA) 180 MG tablet Take 180 mg by mouth daily.    [provider]  gabapentin (NEURONTIN) 300 MG capsule Take 600 mg by mouth 3 (three) times daily.     [provider]  hydrOXYzine (ATARAX/VISTARIL) 25 MG tablet Take 25 mg by mouth daily as needed.  03/27/19   [provider]  lamoTRIgine (LAMICTAL) 150 MG tablet Take 150 mg by mouth daily.     [provider]  montelukast (SINGULAIR) 10 MG tablet Take 1 tablet (10 mg total) by mouth at bedtime. For allergies. 03/31/19   [name], NP  ondansetron (ZOFRAN-ODT) 4 MG disintegrating tablet Take 1 tablet (4 mg total) by mouth every 8 (eight) hours as needed for nausea or vomiting. 10/23/20   Copland, Frederico Hamman, MD  oxyCODONE-acetaminophen (PERCOCET/ROXICET) 5-325 MG tablet Take by mouth 2 (two) times daily as needed. 09/03/16   [provider]  QUEtiapine (SEROQUEL) 100 MG tablet Take 100 mg by mouth at bedtime.    [provider]  QUEtiapine (SEROQUEL) 25 MG tablet Take 1 tablet by mouth daily. 05/26/19   [provider]    Physical Exam: Vitals:   10/23/20 1321 10/23/20 1322 10/23/20 1530 10/23/20 1532  BP: (!) 119/59   (!) 116/58  Pulse: 70  69 69  Resp: 18  20 20   Temp: 99.1 F (37.3 C)   98.9 F (37.2 C)  TempSrc: Oral   Oral  SpO2: 96%   98%  Weight:  71.9 kg    Height:  5\' 3"  (1.6 m)       Vitals:   10/23/20 1321 10/23/20 1322 10/23/20 1530 10/23/20 1532  BP: (!) 119/59   (!) 116/58  Pulse: 70  69 69  Resp: 18  20 20   Temp: 99.1 F (37.3 C)   98.9 F (37.2 C)  TempSrc: Oral   Oral  SpO2: 96%   98%  Weight:  71.9 kg    Height:  5\' 3"  (1.6 m)        Constitutional: Alert and oriented x 3 . Not in any apparent distress HEENT:      Head: Normocephalic and atraumatic.         Eyes: PERLA, EOMI, Conjunctivae are normal. Sclera is non-icteric.       Mouth/Throat: Mucous membranes are moist.       Neck: Supple with no signs of meningismus. Cardiovascular: Regular rate and rhythm. No murmurs, gallops, or rubs. 2+ symmetrical distal pulses are present . No JVD. No LE edema Respiratory: Respiratory effort normal .Lungs sounds clear bilaterally. No wheezes, crackles, or rhonchi.  Gastrointestinal: Soft, tender in LLQ, non distended with positive bowel sounds.  Genitourinary: No CVA tenderness. Musculoskeletal: Nontender with  normal range of motion in all extremities. No cyanosis, or erythema of extremities. Neurologic:  Face is symmetric. Moving all extremities. No gross focal neurologic deficits . Skin: Skin is warm, dry.  No rash or ulcers Psychiatric: Mood and affect are normal    Labs on Admission: I have personally reviewed following labs and imaging studies  CBC: Recent Labs  Lab 10/23/20 1325  WBC 9.9  HGB 12.8  HCT 39.0  MCV 81.8  PLT 288   Basic Metabolic Panel: Recent Labs  Lab 10/23/20 1325  NA 139  K 4.1  CL 104  CO2 26  GLUCOSE 95  BUN 11  CREATININE 0.87  CALCIUM 9.2   GFR: Estimated Creatinine Clearance: 58 mL/min (by C-G formula based on SCr of 0.87 mg/dL). Liver Function Tests: Recent Labs  Lab 10/23/20 1325  AST 17  ALT 14  ALKPHOS 59  BILITOT 0.7  PROT 6.9  ALBUMIN 3.9   Recent Labs  Lab 10/23/20 1325  LIPASE 22   No results for input(s): AMMONIA in the last 168 hours. Coagulation Profile: No results for input(s): INR, PROTIME in the last 168 hours. Cardiac Enzymes: No results for input(s): CKTOTAL, CKMB, CKMBINDEX, TROPONINI in the last 168 hours. BNP (last 3 results) No results for input(s): PROBNP in the last 8760 hours. HbA1C: No results for input(s): HGBA1C in the last 72 hours. CBG: No results for input(s): GLUCAP in the last 168 hours. Lipid Profile: No results for input(s): CHOL, HDL, LDLCALC, TRIG, CHOLHDL, LDLDIRECT in the last 72 hours. Thyroid Function Tests: Recent Labs    10/22/20 1026  TSH 3.59  FREET4 0.66   Anemia Panel: No results for input(s): VITAMINB12, FOLATE, FERRITIN, TIBC, IRON, RETICCTPCT in the last 72 hours. Urine analysis:    Component Value Date/Time   COLORURINE YELLOW (A) 10/23/2020 1700   APPEARANCEUR CLEAR (A) 10/23/2020 1700   APPEARANCEUR Clear 11/14/2018 1034   LABSPEC 1.015 10/23/2020 1700   PHURINE 5.0 10/23/2020 1700   GLUCOSEU NEGATIVE 10/23/2020 1700   HGBUR NEGATIVE 10/23/2020 1700   BILIRUBINUR  NEGATIVE 10/23/2020 1700   BILIRUBINUR Negative 10/23/2020 1216   BILIRUBINUR Negative 11/14/2018 1034   KETONESUR NEGATIVE 10/23/2020 1700   PROTEINUR NEGATIVE 10/23/2020 1700   PROTEINUR Positive (A) 10/23/2020 1216   UROBILINOGEN 0.2 10/23/2020 1216   NITRITE NEGATIVE 10/23/2020 1700   NITRITE Negative 10/23/2020 1216   LEUKOCYTESUR TRACE (A) 10/23/2020 1700   LEUKOCYTESUR Trace (A) 10/23/2020 1216    Radiological Exams on Admission: CT Renal Stone Study  Result Date: 10/23/2020 CLINICAL DATA:  Left lower quadrant pain for several days, history of renal calculi EXAM: CT ABDOMEN AND PELVIS WITHOUT CONTRAST TECHNIQUE: Multidetector CT imaging of the abdomen and pelvis was performed following the standard protocol without IV contrast. COMPARISON:  09/21/2018 FINDINGS: Lower chest: No acute pleural or parenchymal lung disease. Hepatobiliary: 5 cm cyst superior aspect right lobe liver. 5.5 cm hypodense mass inferior aspect right lobe liver previously shown to represent hemangioma. There are other scattered subcentimeter hypodensities within the right lobe liver that are stable and presumed to reflect small cysts. No intrahepatic duct dilation. The gallbladder is unremarkable. Pancreas: Unremarkable. No pancreatic ductal dilatation or surrounding inflammatory changes. Spleen: Normal in size without focal abnormality. Adrenals/Urinary Tract: Right renal cyst again noted. No urinary tract calculi or obstructive uropathy. Bladder is decompressed, which limits its evaluation. The adrenals are normal. Stomach/Bowel: No bowel obstruction or ileus. There is a short segment of wall thickening at the junction of the descending and sigmoid colon, with mild pericolonic fat stranding consistent with focal colitis or acute uncomplicated diverticulitis. No perforation, fluid collection, or abscess. Normal appendix right lower quadrant. Vascular/Lymphatic: Aortic atherosclerosis. No enlarged abdominal or pelvic lymph  nodes. Reproductive: Uterus and bilateral adnexa are unremarkable. Other: No free fluid or free gas. Postsurgical changes from previous bilateral inguinal hernia repairs. Musculoskeletal: No acute or destructive bony lesions. Reconstructed images  demonstrate no additional findings. IMPRESSION: 1. Short segment colitis versus acute uncomplicated diverticulitis at the junction of the descending and sigmoid colon. No perforation, fluid collection, or abscess. 2. No urinary tract calculi or obstructive uropathy. 3. Stable hepatic cysts and right lobe liver hemangioma. 4.  Aortic Atherosclerosis (ICD10-I70.0). Electronically Signed   By: Sharlet Salina M.D.   On: 10/23/2020 17:40     Assessment/Plan 69 year old female with history of anxiety and chronic back pain who presents to the emergency room with a 4-day history of pain over the left lower quadrant     Acute colitis Intractable pain -Patient with 3 to 4-day history of pain, no fever nausea vomiting or diarrhea with CT abdomen and pelvis showing short segment colitis -Continue IV Cipro and oral Flagyl -IV hydration, IV antiemetics, pain management -Supportive care -Clear liquids    Anxiety and depression -Continue home meds     DVT prophylaxis: Lovenox  Code Status: full code  Family Communication:  none  Disposition Plan: Back to previous home environment Consults called: none  Status: Observation    Andris Baumann MD [facility]     10/23/2020, 7:02 PM

## 2020-10-23 NOTE — ED Triage Notes (Signed)
Pt sent from PCP , c/o flank pain that has moved into the LLQ over the past 3-4 days. States she has a hx of kidney stones years ago. Denies N/V/D or constipation

## 2020-10-23 NOTE — ED Notes (Signed)
Pt up to bathroom  Pt alert.

## 2020-10-23 NOTE — ED Notes (Signed)
UA results in the chart from today at the PCP

## 2020-10-23 NOTE — ED Notes (Signed)
Pt reports left side abd pain for 4 days.  Pt denies n//v/d   Constant pain.  Pain is worse since last night.  Hx kidney stones.  Pt alert  Speech clear.  Pt in hallway bed.

## 2020-10-23 NOTE — Addendum Note (Signed)
Addended by: Damita Lack on: 10/23/2020 12:55 PM   Modules accepted: Orders

## 2020-10-23 NOTE — Progress Notes (Signed)
Sharon Diers T. Artha Chiasson, MD, [place]  [facility] and [place] at [facility] [place], [address]  Phone: [phone]  FAX: [phone]  Sharon Collins - 69 y.o. female  MRN [id number]  Date of Birth: 03-26-1951  Date: 10/23/2020  PCP: [name], NP  Referral: [name], NP  Chief Complaint  Patient presents with  . Abdominal Pain    Started on Sunday    This visit occurred during the SARS-CoV-2 public health emergency.  Safety protocols were in place, including screening questions prior to the visit, additional usage of staff PPE, and extensive cleaning of exam room while observing appropriate contact time as indicated for disinfecting solutions.   Subjective:   Sharon Collins is a 69 y.o. very pleasant female patient with Body mass index is 28.08 kg/m. who presents with the following:  Left lower quadrant pain, but on additional questioning by myself the patient has severe abdominal pain relatively diffusely, but this is worse in the lower aspect of her abdomen.  She was worried that it could potentially be case of pyelonephritis or kidney stone, but she has not had any dysuria and she is not having significant urgency.  She is obviously under significant pain and discomfort and she has difficulty rising from a seated position and getting on the examination table  She denies fever  Her prior abdominal surgeries include a partial hysterectomy only.  This started on Saturday and has been escalating.  No other  No BM  S/p partial hysterectomy Still has all other internal organs  Review of Systems is noted in the HPI, as appropriate  Patient Active Problem List   Diagnosis Date Noted  . Urinary incontinence 07/24/2020  . Family history of thyroid disease 07/24/2020  . Hyperlipidemia 07/24/2020  . Acne 05/20/2020  . Tibialis posterior tendinitis 11/22/2019  . Pain medication  agreement signed 07/25/2019  . Prediabetes 03/31/2019  . Flank pain 09/21/2018  . Lower abdominal pain 09/21/2018  . Preventative health care 01/18/2018  . Anxiety and depression 11/23/2016  . Allergic rhinitis 11/23/2016  . Chronic back pain 11/23/2016  . Family history of coagulation disorder 02/13/2013    Past Medical History:  Diagnosis Date  . Acute medial meniscus tear    left  . Allergic rhinitis   . Anxiety   . Anxiety and depression   . Asthma   . Atrophic vaginitis   . Cataracts, bilateral   . Cervical radiculopathy   . Degenerative disc disease, lumbar   . Insomnia   . Urinary incontinence     Past Surgical History:  Procedure Laterality Date  . BLADDER SUSPENSION    . BUNIONECTOMY    . CATARACT EXTRACTION Right 08/2017  . HAMMER TOE SURGERY    . LAMINECTOMY    . MANDIBLE FRACTURE SURGERY    . MANDIBLE FRACTURE SURGERY    . PARTIAL HYSTERECTOMY      Family History  Problem Relation Age of Onset  . Stroke Mother   . Hypertension Mother   . Thyroid disease Mother   . Cancer Father        Brain Tumor  . Hypertension Sister   . Thyroid disease Sister   . Hypertension Brother   . Mental illness Brother   . Thyroid disease Brother   . COPD Brother   . Heart disease Brother   . Cancer Maternal Aunt   . Cancer Maternal Grandmother   .  Diabetes Neg Hx     Objective:   BP 110/62   Pulse 91   Temp 98 F (36.7 C) (Temporal)   Ht _0  (1.6 m)   Wt 158 lb 8 oz (71.9 kg)   LMP  (LMP Unknown)   SpO2 98%   BMI 28.08 kg/m   GEN: The patient is in moderate distress and pain in the examining room. PULM: Breathing comfortably in no respiratory distress PSYCH: Normally interactive.  CV: RRR, no m/g/r  PULM: Normal respiratory rate, no accessory muscle use. No wheezes, crackles or rhonchi  Abdominal exam: The patient has exquisite abdominal pain with very light examination throughout the entirety of the right, hypogastric, as well as lower left  abdominal exam.  She is not distended.  She also has some pain with the upper abdomen exam, but this is referred to the lower abdomen and is much less painful compared to the lower abdomen.  She does have positive bowel sounds. She is significantly guarding. She does have pain with percussion. She does have pain with me hitting her plantar aspect of her feet.  Does have some pain in the lower erector spinae complex, but she relates that this is not different than her baseline.  She does have some tenderness at the SI joints as well.  Laboratory and Imaging Data: Results for orders placed or performed in visit on 10/23/20  POCT Urinalysis Dipstick (Automated)  Result Value Ref Range   Color, UA Yellow    Clarity, UA Clear    Glucose, UA Negative Negative   Bilirubin, UA Negative    Ketones, UA Trace    Spec Grav, UA 1.025 1.010 - 1.025   Blood, UA Trace    pH, UA 5.5 5.0 - 8.0   Protein, UA Positive (A) Negative   Urobilinogen, UA 0.2 0.2 or 1.0 E.U./dL   Nitrite, UA Negative    Leukocytes, UA Trace (A) Negative    Lab Review:  CBC EXTENDED Latest Ref Rng & Units 05/20/2020 10/27/2019 09/21/2018  WBC 4.0 - 10.5 K/uL 7.7 7.3 10.1  RBC 3.87 - 5.11 Mil/uL 4.80 4.97 4.99  HGB 12.0 - 15.0 g/dL 12.6 12.9 13.1  HCT 36.0 - 46.0 % 38.2 39.0 40.1  PLT 150.0 - 400.0 K/uL 308.0 274 312.0  NEUTROABS 1.4 - 7.7 K/uL - - 7.2  LYMPHSABS 0.7 - 4.0 K/uL - - 1.7    BMP Latest Ref Rng & Units 07/24/2020 05/20/2020 10/27/2019  Glucose 70 - 99 mg/dL 86 94 94  BUN 6 - 23 mg/dL _1 Creatinine 0.40 - 1.20 mg/dL 0.86 0.96 0.72  BUN/Creat Ratio 12 - 28 - - -  Sodium 135 - 145 mEq/L 139 139 138  Potassium 3.5 - 5.1 mEq/L 4.2 4.2 3.9  Chloride 96 - 112 mEq/L 102 105 106  CO2 19 - 32 mEq/L _2 Calcium 8.4 - 10.5 mg/dL 9.4 9.2 9.2    Hepatic Function Latest Ref Rng & Units 05/20/2020 03/29/2019 09/21/2018  Total Protein 6.0 - 8.3 g/dL 6.6 6.3 7.0  Albumin 3.5 - 5.2 g/dL 4.2 4.1 4.4  AST 0 - 37  U/L _3 ALT 0 - 35 U/L _4 Alk Phosphatase 39 - 117 U/L 63 65 68  Total Bilirubin 0.2 - 1.2 mg/dL 0.4 0.4 0.6    Lab Results  Component Value Date   CHOL 205 (H) 05/20/2020   Lab Results  Component Value Date  HDL 59.90 05/20/2020   Lab Results  Component Value Date   LDLCALC 122 (H) 05/20/2020   Lab Results  Component Value Date   TRIG 111.0 05/20/2020   Lab Results  Component Value Date   CHOLHDL 3 05/20/2020   No results for input(s): PSA in the last 72 hours. No results found for: HCVAB No results found for: VD25OH   Lab Results  Component Value Date   HGBA1C 5.9 05/20/2020   HGBA1C 5.7 (A) 03/31/2019   HGBA1C 5.6 09/22/2016   Lab Results  Component Value Date   LDLCALC 122 (H) 05/20/2020   CREATININE 0.86 07/24/2020     Assessment and Plan:     ICD-10-CM   1. Acute abdomen  R10.0   2. Chronic bilateral back pain, unspecified back location  M54.9 POCT Urinalysis Dipstick (Automated)   G89.29 Urine Culture  3. Acute abdominal pain in left lower quadrant  R10.32   4. Abdominal pain, acute, right lower quadrant  R10.31    I am very concerned that the patient has an acute abdomen.  I do not think that this can safely be assessed and worked up in outpatient setting.  Think that she needs to be at a level of care that if necessary could potentially offer admission and/or surgical treatment depending on additional evaluation.  She indicates that she understands.  I am worried that this may be a life-threatening event, and I explained all this with the patient.  She will go to the emergency room leaving immediately.  I did call and discussed the case with one of the ER nurses at [facility], and they will be expecting her.  Meds ordered this encounter  Medications  . ondansetron (ZOFRAN-ODT) 4 MG disintegrating tablet    Sig: Take 1 tablet (4 mg total) by mouth every 8 (eight) hours as needed for nausea or vomiting.    Dispense:  90 tablet     Refill:  0   Medications Discontinued During This Encounter  Medication Reason  . ondansetron (ZOFRAN-ODT) 4 MG disintegrating tablet Reorder   Orders Placed This Encounter  Procedures  . Urine Culture  . POCT Urinalysis Dipstick (Automated)    Follow-up: No follow-ups on file.  Signed,  Maud Deed. Karle Desrosier, MD   Outpatient Encounter Medications as of 10/23/2020  Medication Sig  . albuterol (VENTOLIN HFA) 108 (90 Base) MCG/ACT inhaler Inhale 2 puffs into the lungs every 6 (six) hours as needed for wheezing or shortness of breath.  Marland Kitchen azelastine (ASTELIN) 0.1 % nasal spray Place 1 spray into both nostrils 2 (two) times daily as needed for rhinitis.  . clindamycin-benzoyl peroxide (BENZACLIN) gel Apply topically 2 (two) times daily.  Marland Kitchen etodolac (LODINE) 500 MG tablet TAKE 1 TABLET BY MOUTH ONCE DAILY AS NEEDED FOR PAIN  . fexofenadine (ALLEGRA) 180 MG tablet Take 180 mg by mouth daily.  Marland Kitchen gabapentin (NEURONTIN) 300 MG capsule Take 600 mg by mouth 3 (three) times daily.   . hydrOXYzine (ATARAX/VISTARIL) 25 MG tablet Take 25 mg by mouth daily as needed.   . lamoTRIgine (LAMICTAL) 150 MG tablet Take 150 mg by mouth daily.  . montelukast (SINGULAIR) 10 MG tablet Take 1 tablet (10 mg total) by mouth at bedtime. For allergies.  Marland Kitchen oxyCODONE-acetaminophen (PERCOCET/ROXICET) 5-325 MG tablet Take by mouth 2 (two) times daily as needed.  Marland Kitchen QUEtiapine (SEROQUEL) 100 MG tablet Take 100 mg by mouth at bedtime.  Marland Kitchen QUEtiapine (SEROQUEL) 25 MG tablet Take 1 tablet by mouth daily.  . [  DISCONTINUED] ondansetron (ZOFRAN-ODT) 4 MG disintegrating tablet Take 1 tablet (4 mg total) by mouth every 8 (eight) hours as needed for nausea or vomiting.  . ondansetron (ZOFRAN-ODT) 4 MG disintegrating tablet Take 1 tablet (4 mg total) by mouth every 8 (eight) hours as needed for nausea or vomiting.   No facility-administered encounter medications on file as of 10/23/2020.

## 2020-10-23 NOTE — ED Provider Notes (Addendum)
[facility] Emergency Department Provider Note   ____________________________________________   Event Date/Time   First MD Initiated Contact with Patient 10/23/20 1643     (approximate)  I have reviewed the triage vital signs and the nursing notes.   HISTORY  Chief Complaint Abdominal Pain and Flank Pain    HPI Sharon Collins is a 69 y.o. female patient reports about 4 days of abdominal pain.  It started out in the left flank/CVA area and has moved around is now in the right lower quadrant.  The pain is constant and severe.  Made worse with palpation and percussion.  Patient had a kidney stone many years ago and thinks that this might be the same but is not sure.  Pain is deep and "painful" patient does not have dysuria         Past Medical History:  Diagnosis Date  . Acute medial meniscus tear    left  . Allergic rhinitis   . Anxiety   . Anxiety and depression   . Asthma   . Atrophic vaginitis   . Cataracts, bilateral   . Cervical radiculopathy   . Degenerative disc disease, lumbar   . Insomnia   . Renal disorder    kidney stones  . Urinary incontinence     Patient Active Problem List   Diagnosis Date Noted  . Urinary incontinence 07/24/2020  . Family history of thyroid disease 07/24/2020  . Hyperlipidemia 07/24/2020  . Acne 05/20/2020  . Tibialis posterior tendinitis 11/22/2019  . Pain medication agreement signed 07/25/2019  . Prediabetes 03/31/2019  . Flank pain 09/21/2018  . Lower abdominal pain 09/21/2018  . Preventative health care 01/18/2018  . Anxiety and depression 11/23/2016  . Allergic rhinitis 11/23/2016  . Chronic back pain 11/23/2016  . Family history of coagulation disorder 02/13/2013    Past Surgical History:  Procedure Laterality Date  . BLADDER SUSPENSION    . BUNIONECTOMY    . CATARACT EXTRACTION Right 08/2017  . HAMMER TOE SURGERY    . LAMINECTOMY    . MANDIBLE FRACTURE SURGERY    . MANDIBLE FRACTURE  SURGERY    . PARTIAL HYSTERECTOMY      Prior to Admission medications   Medication Sig Start Date End Date Taking? Authorizing Provider  albuterol (VENTOLIN HFA) 108 (90 Base) MCG/ACT inhaler Inhale 2 puffs into the lungs every 6 (six) hours as needed for wheezing or shortness of breath. 06/25/20   [name], NP  azelastine (ASTELIN) 0.1 % nasal spray Place 1 spray into both nostrils 2 (two) times daily as needed for rhinitis. 03/31/19   [name], NP  clindamycin-benzoyl peroxide [facility]) gel Apply topically 2 (two) times daily. 06/20/20   [name], NP  etodolac (LODINE) 500 MG tablet TAKE 1 TABLET BY MOUTH ONCE DAILY AS NEEDED FOR PAIN 04/26/20   [name], NP  fexofenadine (ALLEGRA) 180 MG tablet Take 180 mg by mouth daily.    [provider]  gabapentin (NEURONTIN) 300 MG capsule Take 600 mg by mouth 3 (three) times daily.     [provider]  hydrOXYzine (ATARAX/VISTARIL) 25 MG tablet Take 25 mg by mouth daily as needed.  03/27/19   [provider]  lamoTRIgine (LAMICTAL) 150 MG tablet Take 150 mg by mouth daily.    [provider]  montelukast (SINGULAIR) 10 MG tablet Take 1 tablet (10 mg total) by mouth at bedtime. For allergies. 03/31/19   [name],  NP  ondansetron (ZOFRAN-ODT) 4 MG disintegrating tablet Take 1 tablet (4 mg total) by mouth every 8 (eight) hours as needed for nausea or vomiting. 10/23/20   Copland, Karleen Hampshire, MD  oxyCODONE-acetaminophen (PERCOCET/ROXICET) 5-325 MG tablet Take by mouth 2 (two) times daily as needed. 09/03/16   [provider]  QUEtiapine (SEROQUEL) 100 MG tablet Take 100 mg by mouth at bedtime.    [provider]  QUEtiapine (SEROQUEL) 25 MG tablet Take 1 tablet by mouth daily. 05/26/19   [provider]    Allergies Codeine, Hydrocodone, and Tramadol  Family History  Problem Relation Age of Onset  . Stroke Mother   . Hypertension Mother   . Thyroid  disease Mother   . Cancer Father        Brain Tumor  . Hypertension Sister   . Thyroid disease Sister   . Hypertension Brother   . Mental illness Brother   . Thyroid disease Brother   . COPD Brother   . Heart disease Brother   . Cancer Maternal Aunt   . Cancer Maternal Grandmother   . Diabetes Neg Hx     Social History Social History   Tobacco Use  . Smoking status: Never Smoker  . Smokeless tobacco: Never Used  Vaping Use  . Vaping Use: Never used  Substance Use Topics  . Alcohol use: No  . Drug use: Yes    Comment: prescribed oxy through pain clinic for back    Review of Systems  Constitutional: No fever/chills Eyes: No visual changes. ENT: No sore throat. Cardiovascular: Denies chest pain. Respiratory: Denies shortness of breath. Gastrointestinal: abdominal pain.  No nausea, no vomiting.  No diarrhea.  No constipation. Genitourinary: Negative for dysuria. Musculoskeletal: Negative for back pain. Skin: Negative for rash. Neurological: Negative for headaches, focal weakness   ____________________________________________   PHYSICAL EXAM:  VITAL SIGNS: ED Triage Vitals  Enc Vitals Group     BP 10/23/20 1321 (!) 119/59     Pulse Rate 10/23/20 1321 70     Resp 10/23/20 1321 18     Temp 10/23/20 1321 99.1 F (37.3 C)     Temp Source 10/23/20 1321 Oral     SpO2 10/23/20 1321 96 %     Weight 10/23/20 1322 158 lb 8 oz (71.9 kg)     Height 10/23/20 1322 5\' 3"  (1.6 m)     Head Circumference --      Peak Flow --      Pain Score 10/23/20 1321 7     Pain Loc --      Pain Edu? --      Excl. in GC? --     Constitutional: Alert and oriented.  Looks uncomfortable Eyes: Conjunctivae are normal. Head: Atraumatic. Nose: No congestion/rhinnorhea. Mouth/Throat: Mucous membranes are moist.  Oropharynx non-erythematous. Neck: No stridor.   Cardiovascular: Normal rate, regular rhythm. Grossly normal heart sounds.  Good peripheral circulation. Respiratory: Normal  respiratory effort.  No retractions. Lungs CTAB. Gastrointestinal: Soft tender to palpation percussion especially in the left lower quadrant the right side is not tender at all no distention. No abdominal bruits.  Some left CVA tenderness. Musculoskeletal: No lower extremity tenderness nor edema.   Neurologic:  Normal speech and language. No gross focal neurologic deficits are appreciated. No gait instability. Skin:  Skin is warm, dry and intact. No rash noted. Psychiatric: Mood and affect are normal. Speech and behavior are normal.  ____________________________________________   LABS (all labs ordered are listed, but  only abnormal results are displayed)  Labs Reviewed  URINALYSIS, COMPLETE (UACMP) WITH MICROSCOPIC - Abnormal; Notable for the following components:      Result Value   Color, Urine YELLOW (*)    APPearance CLEAR (*)    Leukocytes,Ua TRACE (*)    All other components within normal limits  LIPASE, BLOOD  COMPREHENSIVE METABOLIC PANEL  CBC  DIFFERENTIAL   ____________________________________________  EKG   ____________________________________________  RADIOLOGY Gertha Calkin, personally viewed and evaluated these images (plain radiographs) as part of my medical decision making, as well as reviewing the written report by the radiologist.  ED MD interpretation:   Official radiology report(s): CT Renal Stone Study  Result Date: 10/23/2020 CLINICAL DATA:  Left lower quadrant pain for several days, history of renal calculi EXAM: CT ABDOMEN AND PELVIS WITHOUT CONTRAST TECHNIQUE: Multidetector CT imaging of the abdomen and pelvis was performed following the standard protocol without IV contrast. COMPARISON:  09/21/2018 FINDINGS: Lower chest: No acute pleural or parenchymal lung disease. Hepatobiliary: 5 cm cyst superior aspect right lobe liver. 5.5 cm hypodense mass inferior aspect right lobe liver previously shown to represent hemangioma. There are other scattered  subcentimeter hypodensities within the right lobe liver that are stable and presumed to reflect small cysts. No intrahepatic duct dilation. The gallbladder is unremarkable. Pancreas: Unremarkable. No pancreatic ductal dilatation or surrounding inflammatory changes. Spleen: Normal in size without focal abnormality. Adrenals/Urinary Tract: Right renal cyst again noted. No urinary tract calculi or obstructive uropathy. Bladder is decompressed, which limits its evaluation. The adrenals are normal. Stomach/Bowel: No bowel obstruction or ileus. There is a short segment of wall thickening at the junction of the descending and sigmoid colon, with mild pericolonic fat stranding consistent with focal colitis or acute uncomplicated diverticulitis. No perforation, fluid collection, or abscess. Normal appendix right lower quadrant. Vascular/Lymphatic: Aortic atherosclerosis. No enlarged abdominal or pelvic lymph nodes. Reproductive: Uterus and bilateral adnexa are unremarkable. Other: No free fluid or free gas. Postsurgical changes from previous bilateral inguinal hernia repairs. Musculoskeletal: No acute or destructive bony lesions. Reconstructed images demonstrate no additional findings. IMPRESSION: 1. Short segment colitis versus acute uncomplicated diverticulitis at the junction of the descending and sigmoid colon. No perforation, fluid collection, or abscess. 2. No urinary tract calculi or obstructive uropathy. 3. Stable hepatic cysts and right lobe liver hemangioma. 4.  Aortic Atherosclerosis (ICD10-I70.0). Electronically Signed   By: Randa Ngo M.D.   On: 10/23/2020 17:40    ____________________________________________   PROCEDURES  Procedure(s) performed (including Critical Care): Critical care time 15 minutes.  This includes talking to the patient twice reviewing the CT scan and talking to GI and the hospital doctor.  Procedures   ____________________________________________   INITIAL IMPRESSION /  ASSESSMENT AND PLAN / ED COURSE  Patient reports nausea with pain medication.  I will give her some Zofran and a little bit of morphine and increase the morphine as needed.  She is fairly uncomfortable.  Since she is older and fairly small I will start with a lower dose.  This sounds like it could be renal colic but of course could be anything from diverticulitis to bowel torsion or even an aneurysm.  We will start the evaluation with a CT are.       CT is read as colitis although could be diverticulitis.  I will get her some antibiotics and because she is in so much pain we will get her in the hospital.  ____________________________________________   FINAL CLINICAL IMPRESSION(S) / ED DIAGNOSES  Final diagnoses:  Left upper quadrant abdominal pain  Colitis     ED Discharge Orders    None      *Please note:  Sharon Collins was evaluated in Emergency Department on 10/23/2020 for the symptoms described in the history of present illness. She was evaluated in the context of the global COVID-19 pandemic, which necessitated consideration that the patient might be at risk for infection with the SARS-CoV-2 virus that causes COVID-19. Institutional protocols and algorithms that pertain to the evaluation of patients at risk for COVID-19 are in a state of rapid change based on information released by regulatory bodies including the CDC and federal and state organizations. These policies and algorithms were followed during the patient's care in the ED.  Some ED evaluations and interventions may be delayed as a result of limited staffing during and the pandemic.*   Note:  This document was prepared using Dragon voice recognition software and may include unintentional dictation errors.    Nena Polio, MD 10/23/20 1851    Nena Polio, MD 10/23/20 [phone]

## 2020-10-23 NOTE — Consult Note (Signed)
[facility] GI Inpatient Consult Note   Consultant: Kathline Magic, M.D.  Reason for Consult: LLQ abdominal pain, abnormal CT of the GI tract.   Attending Requesting Consult: Conni Slipper, MD (Emergency Room)                                                        Dr. Judd Gaudier ([facility])  History of Present Illness: Sharon Collins is a 69 y.o. female with history of DJD of the spine, osteoarthritis, Bipolar I disorder, Nephrolithiasis who is being admitted to the hospital for LLQ pain. Patient reportedly has experienced LLQ and Left flank pain for the last 4 days and was worried it may be related to another kidney stone. She has chronic back pain from DJD of the spine and has undergone several injections of the spine in the past. She takes Oxycodone/APAP at home for pain as needed.  In the Emergency room, blood work was obtained that was all essentially normal with normal WBC, Hgb, Lipase, Urinalysis, electrolytes and serum Cr. CT renal protocol was performed showing no renal stone but an area of "wall thickening" at the junction of the descending colon and sigmoid colon with some pericolonic fat stranding suggestive of acute segmental colitis vs. Diverticulitis. Incidental finding of iver hemangioma, hepatic cysts.WBC is normal at around [place] and there is no fever or change in bowel habits. No hematochezia.  Patient reports her last colonoscopy was performed 5-6 years ago and was "normal". Reportedly done at [place]. I was unable to find the report.  Past Medical History:  Past Medical History:  Diagnosis Date  . Acute medial meniscus tear    left  . Allergic rhinitis   . Anxiety   . Anxiety and depression   . Asthma   . Atrophic vaginitis   . Cataracts, bilateral   . Cervical radiculopathy   . Degenerative disc disease, lumbar   . Insomnia   . Renal disorder    kidney stones  . Urinary incontinence     Problem List: Patient Active Problem  List   Diagnosis Date Noted  . Urinary incontinence 07/24/2020  . Family history of thyroid disease 07/24/2020  . Hyperlipidemia 07/24/2020  . Acne 05/20/2020  . Tibialis posterior tendinitis 11/22/2019  . Pain medication agreement signed 07/25/2019  . Prediabetes 03/31/2019  . Flank pain 09/21/2018  . Lower abdominal pain 09/21/2018  . Preventative health care 01/18/2018  . Anxiety and depression 11/23/2016  . Allergic rhinitis 11/23/2016  . Chronic back pain 11/23/2016  . Family history of coagulation disorder 02/13/2013    Past Surgical History: Past Surgical History:  Procedure Laterality Date  . BLADDER SUSPENSION    . BUNIONECTOMY    . CATARACT EXTRACTION Right 08/2017  . HAMMER TOE SURGERY    . LAMINECTOMY    . MANDIBLE FRACTURE SURGERY    . MANDIBLE FRACTURE SURGERY    . PARTIAL HYSTERECTOMY      Allergies: Allergies  Allergen Reactions  . Codeine   . Hydrocodone Nausea And Vomiting    Vicodin causes vomiting, but when takes Phenergan 0.25 mg with it, she can tolerate it.  . Tramadol Nausea And Vomiting    Causes vomiting & affects her heart rhythm.    Home Medications: (Not in a hospital admission)  Home medication  reconciliation was completed with the patient.   Scheduled Inpatient Medications: See MAR    Continuous Inpatient Infusions:   . ciprofloxacin    . metronidazole      PRN Inpatient Medications:    Family History: family history includes COPD in her brother; Cancer in her father, maternal aunt, and maternal grandmother; Heart disease in her brother; Hypertension in her brother, mother, and sister; Mental illness in her brother; Stroke in her mother; Thyroid disease in her brother, mother, and sister.   GI Family History: Negative  Social History:   reports that she has never smoked. She has never used smokeless tobacco. She reports current drug use. She reports that she does not drink alcohol. The patient denies ETOH, tobacco, or drug  use.    Review of Systems: Review of Systems - General ROS: positive for  - sleep disturbance negative for - chills, fatigue, hot flashes or weight loss Psychological ROS: negative Ophthalmic ROS: negative ENT ROS: negative Allergy and Immunology ROS: negative Hematological and Lymphatic ROS: negative Endocrine ROS: negative for - galactorrhea, hair pattern changes, palpitations, skin changes or temperature intolerance Respiratory ROS: no cough, shortness of breath, or wheezing Cardiovascular ROS: no chest pain or dyspnea on exertion Genito-Urinary ROS: no dysuria, trouble voiding, or hematuria Musculoskeletal ROS: negative Neurological ROS: no TIA or stroke symptoms Dermatological ROS: negative  Physical Examination: BP (!) 116/58 (BP Location: Left Arm)   Pulse 69   Temp 98.9 F (37.2 C) (Oral)   Resp 20   Ht 5\' 3"  (1.6 m)   Wt 71.9 kg   LMP  (LMP Unknown)   SpO2 98%   BMI 28.08 kg/m  Physical Exam Constitutional:      General: She is not in acute distress.    Appearance: She is well-developed. She is ill-appearing. She is not toxic-appearing or diaphoretic.  HENT:     Head: Normocephalic and atraumatic.     Mouth/Throat:     Mouth: Mucous membranes are moist.  Eyes:     Extraocular Movements: Extraocular movements intact.     Pupils: Pupils are equal, round, and reactive to light.  Cardiovascular:     Rate and Rhythm: Normal rate.     Heart sounds: Normal heart sounds.  Pulmonary:     Effort: Pulmonary effort is normal.     Breath sounds: Normal breath sounds.  Abdominal:     General: Bowel sounds are normal. There is distension. There is no abdominal bruit. There are signs of injury.     Palpations: Abdomen is soft. There is no shifting dullness, fluid wave, mass or pulsatile mass.     Tenderness: There is abdominal tenderness in the left lower quadrant. There is no right CVA tenderness, left CVA tenderness, guarding or rebound. Negative signs include Murphy's  sign, McBurney's sign and obturator sign.     Hernia: No hernia is present.  Skin:    General: Skin is warm and dry.  Neurological:     General: No focal deficit present.     Mental Status: She is alert.  Psychiatric:        Mood and Affect: Mood normal.        Behavior: Behavior normal.     Data: Lab Results  Component Value Date   WBC 9.9 10/23/2020   HGB 12.8 10/23/2020   HCT 39.0 10/23/2020   MCV 81.8 10/23/2020   PLT 288 10/23/2020   Recent Labs  Lab 10/23/20 1325  HGB 12.8   Lab Results  Component Value Date   NA 139 10/23/2020   K 4.1 10/23/2020   CL 104 10/23/2020   CO2 26 10/23/2020   BUN 11 10/23/2020   CREATININE 0.87 10/23/2020   Lab Results  Component Value Date   ALT 14 10/23/2020   AST 17 10/23/2020   ALKPHOS 59 10/23/2020   BILITOT 0.7 10/23/2020   No results for input(s): APTT, INR, PTT in the last 168 hours. CBC Latest Ref Rng & Units 10/23/2020 05/20/2020 10/27/2019  WBC 4.0 - 10.5 K/uL 9.9 7.7 7.3  Hemoglobin 12.0 - 15.0 g/dL 12.8 12.6 12.9  Hematocrit 36.0 - 46.0 % 39.0 38.2 39.0  Platelets 150 - 400 K/uL 288 308.0 274    STUDIES: CT Renal Stone Study  Result Date: 10/23/2020 CLINICAL DATA:  Left lower quadrant pain for several days, history of renal calculi EXAM: CT ABDOMEN AND PELVIS WITHOUT CONTRAST TECHNIQUE: Multidetector CT imaging of the abdomen and pelvis was performed following the standard protocol without IV contrast. COMPARISON:  09/21/2018 FINDINGS: Lower chest: No acute pleural or parenchymal lung disease. Hepatobiliary: 5 cm cyst superior aspect right lobe liver. 5.5 cm hypodense mass inferior aspect right lobe liver previously shown to represent hemangioma. There are other scattered subcentimeter hypodensities within the right lobe liver that are stable and presumed to reflect small cysts. No intrahepatic duct dilation. The gallbladder is unremarkable. Pancreas: Unremarkable. No pancreatic ductal dilatation or surrounding  inflammatory changes. Spleen: Normal in size without focal abnormality. Adrenals/Urinary Tract: Right renal cyst again noted. No urinary tract calculi or obstructive uropathy. Bladder is decompressed, which limits its evaluation. The adrenals are normal. Stomach/Bowel: No bowel obstruction or ileus. There is a short segment of wall thickening at the junction of the descending and sigmoid colon, with mild pericolonic fat stranding consistent with focal colitis or acute uncomplicated diverticulitis. No perforation, fluid collection, or abscess. Normal appendix right lower quadrant. Vascular/Lymphatic: Aortic atherosclerosis. No enlarged abdominal or pelvic lymph nodes. Reproductive: Uterus and bilateral adnexa are unremarkable. Other: No free fluid or free gas. Postsurgical changes from previous bilateral inguinal hernia repairs. Musculoskeletal: No acute or destructive bony lesions. Reconstructed images demonstrate no additional findings. IMPRESSION: 1. Short segment colitis versus acute uncomplicated diverticulitis at the junction of the descending and sigmoid colon. No perforation, fluid collection, or abscess. 2. No urinary tract calculi or obstructive uropathy. 3. Stable hepatic cysts and right lobe liver hemangioma. 4.  Aortic Atherosclerosis (ICD10-I70.0). Electronically Signed   By: Randa Ngo M.D.   On: 10/23/2020 17:40   @IMAGES @  Assessment: 1. Left lower quadrant tenderness,  Possible diverticulitis vs. Segmental colitis (IBD, SCAD, ischemic colitis (not likely), colon malignancy.IV antibiotics ordered and started (Cipro + Flagyl).  2. Chronic back pain from Osteoarthritis.  3. History of Bipolar disorder. Currently asymptomatic.  4. History of Asthma.  COVID-19 status: Tested negative.  Recommendations:  1. Low fiber diet. 2. IV antibiotics as ordered. 3. Pain medication as deemed appropriate.  4. Serial examinations. 5. Colonoscopy electively when feasible, likely as  outpatient. 6. Will follow with you.  Thank you for the consult. Please call with questions or concerns.  Olean Ree, "Lanny Hurst MD [facility] [place], [place] [address] [phone]  10/23/2020 6:51 PM

## 2020-10-23 NOTE — ED Notes (Signed)
meds given for pain again.  Iv meds infusing.  Pt alert  Pt in hallway bed

## 2020-10-24 DIAGNOSIS — K7689 Other specified diseases of liver: Secondary | ICD-10-CM | POA: Diagnosis present

## 2020-10-24 DIAGNOSIS — B373 Candidiasis of vulva and vagina: Secondary | ICD-10-CM | POA: Diagnosis present

## 2020-10-24 DIAGNOSIS — F319 Bipolar disorder, unspecified: Secondary | ICD-10-CM | POA: Diagnosis present

## 2020-10-24 DIAGNOSIS — Z808 Family history of malignant neoplasm of other organs or systems: Secondary | ICD-10-CM | POA: Diagnosis not present

## 2020-10-24 DIAGNOSIS — K529 Noninfective gastroenteritis and colitis, unspecified: Secondary | ICD-10-CM | POA: Diagnosis not present

## 2020-10-24 DIAGNOSIS — G8929 Other chronic pain: Secondary | ICD-10-CM | POA: Diagnosis present

## 2020-10-24 DIAGNOSIS — Z8349 Family history of other endocrine, nutritional and metabolic diseases: Secondary | ICD-10-CM | POA: Diagnosis not present

## 2020-10-24 DIAGNOSIS — J309 Allergic rhinitis, unspecified: Secondary | ICD-10-CM | POA: Diagnosis present

## 2020-10-24 DIAGNOSIS — Z8249 Family history of ischemic heart disease and other diseases of the circulatory system: Secondary | ICD-10-CM | POA: Diagnosis not present

## 2020-10-24 DIAGNOSIS — R7303 Prediabetes: Secondary | ICD-10-CM | POA: Diagnosis present

## 2020-10-24 DIAGNOSIS — Z79899 Other long term (current) drug therapy: Secondary | ICD-10-CM | POA: Diagnosis not present

## 2020-10-24 DIAGNOSIS — Z87442 Personal history of urinary calculi: Secondary | ICD-10-CM | POA: Diagnosis not present

## 2020-10-24 DIAGNOSIS — F419 Anxiety disorder, unspecified: Secondary | ICD-10-CM | POA: Diagnosis present

## 2020-10-24 DIAGNOSIS — Z823 Family history of stroke: Secondary | ICD-10-CM | POA: Diagnosis not present

## 2020-10-24 DIAGNOSIS — Z825 Family history of asthma and other chronic lower respiratory diseases: Secondary | ICD-10-CM | POA: Diagnosis not present

## 2020-10-24 DIAGNOSIS — M479 Spondylosis, unspecified: Secondary | ICD-10-CM | POA: Diagnosis present

## 2020-10-24 DIAGNOSIS — Z20822 Contact with and (suspected) exposure to covid-19: Secondary | ICD-10-CM | POA: Diagnosis present

## 2020-10-24 DIAGNOSIS — K5792 Diverticulitis of intestine, part unspecified, without perforation or abscess without bleeding: Secondary | ICD-10-CM | POA: Diagnosis present

## 2020-10-24 LAB — BASIC METABOLIC PANEL
Anion gap: 8 (ref 5–15)
BUN: 10 mg/dL (ref 8–23)
CO2: 27 mmol/L (ref 22–32)
Calcium: 8.5 mg/dL — ABNORMAL LOW (ref 8.9–10.3)
Chloride: 104 mmol/L (ref 98–111)
Creatinine, Ser: 0.89 mg/dL (ref 0.44–1.00)
GFR, Estimated: >60 mL/min (ref >60)
Glucose, Bld: 86 mg/dL (ref 70–99)
Potassium: 4.6 mmol/L (ref 3.5–5.1)
Sodium: 139 mmol/L (ref 135–145)

## 2020-10-24 LAB — CBC
HCT: 35.9 % — ABNORMAL LOW (ref 36.0–46.0)
Hemoglobin: 11.5 g/dL — ABNORMAL LOW (ref 12.0–15.0)
MCH: 26.6 pg (ref 26.0–34.0)
MCHC: 32 g/dL (ref 30.0–36.0)
MCV: 83.1 fL (ref 80.0–100.0)
Platelets: 242 10*3/uL (ref 150–400)
RBC: 4.32 MIL/uL (ref 3.87–5.11)
RDW: 15 % (ref 11.5–15.5)
WBC: 6.9 10*3/uL (ref 4.0–10.5)
nRBC: 0 % (ref 0.0–0.2)

## 2020-10-24 LAB — HIV ANTIBODY (ROUTINE TESTING W REFLEX): HIV Screen 4th Generation wRfx: NONREACTIVE

## 2020-10-24 MED ORDER — SERTRALINE HCL 50 MG PO TABS
100.0000 mg | ORAL_TABLET | Freq: Every day | ORAL | Status: DC
  Administered 2020-10-25 – 2020-10-29 (×5): 100 mg via ORAL
  Filled 2020-10-24 (×5): qty 2

## 2020-10-24 MED ORDER — OXYCODONE-ACETAMINOPHEN 5-325 MG PO TABS
1.0000 | ORAL_TABLET | Freq: Four times a day (QID) | ORAL | Status: DC | PRN
  Administered 2020-10-24 – 2020-10-27 (×8): 1 via ORAL
  Filled 2020-10-24 (×8): qty 1

## 2020-10-24 MED ORDER — LAMOTRIGINE 25 MG PO TABS
150.0000 mg | ORAL_TABLET | Freq: Every day | ORAL | Status: DC
  Administered 2020-10-25: 150 mg via ORAL
  Filled 2020-10-24: qty 6

## 2020-10-24 MED ORDER — GABAPENTIN 300 MG PO CAPS
600.0000 mg | ORAL_CAPSULE | Freq: Three times a day (TID) | ORAL | Status: DC
Start: 2020-10-24 — End: 2020-10-29
  Administered 2020-10-24 – 2020-10-29 (×15): 600 mg via ORAL
  Filled 2020-10-24 (×16): qty 2

## 2020-10-24 MED ORDER — BISACODYL 10 MG RE SUPP
10.0000 mg | Freq: Once | RECTAL | Status: AC
  Administered 2020-10-25: 10 mg via RECTAL
  Filled 2020-10-24: qty 1

## 2020-10-24 MED ORDER — LIDOCAINE 5 % EX PTCH
1.0000 | MEDICATED_PATCH | CUTANEOUS | Status: DC
  Administered 2020-10-24 – 2020-10-29 (×6): 1 via TRANSDERMAL
  Filled 2020-10-24 (×6): qty 1

## 2020-10-24 MED ORDER — METOCLOPRAMIDE HCL 5 MG/ML IJ SOLN
5.0000 mg | Freq: Four times a day (QID) | INTRAMUSCULAR | Status: DC | PRN
  Administered 2020-10-24 – 2020-10-25 (×4): 5 mg via INTRAVENOUS
  Filled 2020-10-24 (×4): qty 2

## 2020-10-24 NOTE — Progress Notes (Signed)
PROGRESS NOTE    Sharon Collins  WUJ:[id number] DOB: 07-22-1951 DOA: 10/23/2020 PCP: Doreene Nest, NP   Chief Complaint  Patient presents with  . Abdominal Pain  . Flank Pain    Brief Narrative: 69 year old female with history of anxiety/chronic back pain, history of nephrolithiasis several years ago presented to the ED 10/23/20 with 4-day history of pain over left lower quadrant, which is unrelieved with OTC medication without nausea vomiting or diarrhea. ED Course: On arrival, she was afebrile, BP 116/58, pulse 69 O2 sat 98% on room air.  Blood work including CBC, CMP and lipase were all within normal limits.  Urinalysis without pyuria Imaging: CT renal stone study shows short segment colitis versus acute uncomplicated diverticulitis at the junction of the descending and sigmoid colon without perforation fluid collection or abscess.  Patient treated with pain medication and given IV antibiotics however due to intractable pain, hospitalist consulted for admission for pain control.   Subjective: Seen and examined. Patient complains of ongoing pain does not feel comfortable to go home Afebrile overnight low-grade 99.1 fever.  Labs reassuring.  Assessment & Plan:  Acute colitis with intractable pain, CT abdomen shows short segment colitis versus acute uncomplicated diverticulitis.  Patient with significant pain admitted with IV hydration IV antiemetics pain management and IV Cipro/oral Flagyl.  Continue soft diet as tolerated.  Continue to address pain and wean down to p.o.  Back pain he reports she has had chronic back issues from degenerative disease.  Denies incontinence numbness tingling or weakness.  Likely worsened in the setting of her abdominal pain.  Continue pain control, add lidocaine patch, hot pad  Anxiety and depression: No home meds Prediabetes: Diet control outpatient follow-up  Nutrition: Diet Order            DIET SOFT Room service appropriate? Yes; Fluid  consistency: Thin  Diet effective now                   Body mass index is 28.08 kg/m.     DVT prophylaxis: enoxaparin (LOVENOX) injection 40 mg Start: 10/23/20 1915 Code Status:   Code Status: Full Code  Family Communication: plan of care discussed with patient at bedside.  Status is: admitted as Observation  The patient will require care spanning > 2 midnights and should be moved to inpatient because: IV treatments appropriate due to intensity of illness or inability to take PO, Inpatient level of care appropriate due to severity of illness and Due to ongoing abdominal pain due to her colitis needing iv pain control regimen.  Dispo: The patient is from: Home              Anticipated d/c is to: Home              Anticipated d/c date is: 1-2 days              Patient currently is not medically stable to d/c.  Consultants:see note  Procedures:see note  Culture/Microbiology No results found for: SDES, SPECREQUEST, CULT, REPTSTATUS  Other culture-see note  Medications: Scheduled Meds: . enoxaparin (LOVENOX) injection  40 mg Subcutaneous Q24H  . gabapentin  600 mg Oral TID  . lamoTRIgine  150 mg Oral QHS  . lidocaine  1 patch Transdermal Q24H  . metroNIDAZOLE  500 mg Oral Q8H  . sertraline  100 mg Oral QHS   Continuous Infusions: . sodium chloride 75 mL/hr at 10/24/20 1155  . ciprofloxacin 400 mg (10/24/20 0622)  Antimicrobials: Anti-infectives (From admission, onward)   Start     Dose/Rate Route Frequency Ordered Stop   10/24/20 0700  ciprofloxacin (CIPRO) IVPB 400 mg        400 mg 200 mL/hr over 60 Minutes Intravenous Every 12 hours 10/23/20 1900     10/24/20 0400  metroNIDAZOLE (FLAGYL) tablet 500 mg        500 mg Oral Every 8 hours 10/23/20 1900     10/23/20 1845  ciprofloxacin (CIPRO) IVPB 400 mg        400 mg 200 mL/hr over 60 Minutes Intravenous  Once 10/23/20 1838 10/23/20 2038   10/23/20 1845  metroNIDAZOLE (FLAGYL) IVPB 500 mg        500 mg 100 mL/hr  over 60 Minutes Intravenous  Once 10/23/20 1838 10/23/20 2137     Objective: Vitals: Today's Vitals   10/24/20 0930 10/24/20 0935 10/24/20 1139 10/24/20 1347  BP:   (!) 94/40   Pulse:   60   Resp:   18   Temp:   98.6 F (37 C)   TempSrc:      SpO2:   97%   Weight:      Height:      PainSc: 6  4   8      Intake/Output Summary (Last 24 hours) at 10/24/2020 1354 Last data filed at 10/24/2020 0650 Gross per 24 hour  Intake 861.86 ml  Output --  Net 861.86 ml   Filed Weights   10/23/20 1322  Weight: 71.9 kg   Weight change:   Intake/Output from previous day: 12/29 0701 - 12/30 0700 In: 861.9 [I.V.:775.3; IV Piggyback:86.6] Out: -  Intake/Output this shift: No intake/output data recorded.  Examination: General exam: AAOx3 ,NAD, weak appearing. HEENT:Oral mucosa moist, Ear/Nose WNL grossly,dentition normal. Respiratory system: bilaterally clear,no wheezing or crackles,no use of accessory muscle, non tender. Cardiovascular system: S1 & S2 +, regular, No JVD. Gastrointestinal system: Abdomen soft, Tender left abdomen,ND, BS+. Nervous System:Alert, awake, moving extremities and grossly nonfocal Extremities: No edema, distal peripheral pulses palpable.  Skin: No rashes,no icterus. MSK: Normal muscle bulk,tone, power  Data Reviewed: I have personally reviewed following labs and imaging studies CBC: Recent Labs  Lab 10/23/20 1325 10/23/20 2313 10/24/20 0520  WBC 9.9 8.3 6.9  NEUTROABS  --  5.1  --   HGB 12.8 12.4 11.5*  HCT 39.0 38.5 35.9*  MCV 81.8 82.6 83.1  PLT 288 259 [id number]   Basic Metabolic Panel: Recent Labs  Lab 10/23/20 1325 10/23/20 2313 10/24/20 0520  NA 139  --  139  K 4.1  --  4.6  CL 104  --  104  CO2 26  --  27  GLUCOSE 95  --  86  BUN 11  --  10  CREATININE 0.87 0.86 0.89  CALCIUM 9.2  --  8.5*   GFR: Estimated Creatinine Clearance: 56.7 mL/min (by C-G formula based on SCr of 0.89 mg/dL). Liver Function Tests: Recent Labs  Lab  10/23/20 1325  AST 17  ALT 14  ALKPHOS 59  BILITOT 0.7  PROT 6.9  ALBUMIN 3.9   Recent Labs  Lab 10/23/20 1325  LIPASE 22   No results for input(s): AMMONIA in the last 168 hours. Coagulation Profile: No results for input(s): INR, PROTIME in the last 168 hours. Cardiac Enzymes: No results for input(s): CKTOTAL, CKMB, CKMBINDEX, TROPONINI in the last 168 hours. BNP (last 3 results) No results for input(s): PROBNP in the last 8760 hours. HbA1C: No  results for input(s): HGBA1C in the last 72 hours. CBG: No results for input(s): GLUCAP in the last 168 hours. Lipid Profile: No results for input(s): CHOL, HDL, LDLCALC, TRIG, CHOLHDL, LDLDIRECT in the last 72 hours. Thyroid Function Tests: Recent Labs    10/22/20 1026  TSH 3.59  FREET4 0.66   Anemia Panel: No results for input(s): VITAMINB12, FOLATE, FERRITIN, TIBC, IRON, RETICCTPCT in the last 72 hours. Sepsis Labs: No results for input(s): PROCALCITON, LATICACIDVEN in the last 168 hours.  Recent Results (from the past 240 hour(s))  Resp Panel by RT-PCR (Flu A&B, Covid) Nasopharyngeal Swab     Status: None   Collection Time: 10/23/20  5:00 PM   Specimen: Nasopharyngeal Swab; Nasopharyngeal(NP) swabs in vial transport medium  Result Value Ref Range Status   SARS Coronavirus 2 by RT PCR NEGATIVE NEGATIVE Final    Comment: (NOTE) SARS-CoV-2 target nucleic acids are NOT DETECTED.  The SARS-CoV-2 RNA is generally detectable in upper respiratory specimens during the acute phase of infection. The lowest concentration of SARS-CoV-2 viral copies this assay can detect is 138 copies/mL. A negative result does not preclude SARS-Cov-2 infection and should not be used as the sole basis for treatment or other patient management decisions. A negative result may occur with  improper specimen collection/handling, submission of specimen other than nasopharyngeal swab, presence of viral mutation(s) within the areas targeted by this  assay, and inadequate number of viral copies(<138 copies/mL). A negative result must be combined with clinical observations, patient history, and epidemiological information. The expected result is Negative.  Fact Sheet for Patients:  EntrepreneurPulse.com.au  Fact Sheet for Healthcare Providers:  IncredibleEmployment.be  This test is no t yet approved or cleared by the [place] FDA and  has been authorized for detection and/or diagnosis of SARS-CoV-2 by FDA under an Emergency Use Authorization (EUA). This EUA will remain  in effect (meaning this test can be used) for the duration of the COVID-19 declaration under Section 564(b)(1) of the Act, 21 U.S.C.section 360bbb-3(b)(1), unless the authorization is terminated  or revoked sooner.       Influenza A by PCR NEGATIVE NEGATIVE Final   Influenza B by PCR NEGATIVE NEGATIVE Final    Comment: (NOTE) The Xpert Xpress SARS-CoV-2/FLU/RSV plus assay is intended as an aid in the diagnosis of influenza from Nasopharyngeal swab specimens and should not be used as a sole basis for treatment. Nasal washings and aspirates are unacceptable for Xpert Xpress SARS-CoV-2/FLU/RSV testing.  Fact Sheet for Patients: EntrepreneurPulse.com.au  Fact Sheet for Healthcare Providers: IncredibleEmployment.be  This test is not yet approved or cleared by the [place] FDA and has been authorized for detection and/or diagnosis of SARS-CoV-2 by FDA under an Emergency Use Authorization (EUA). This EUA will remain in effect (meaning this test can be used) for the duration of the COVID-19 declaration under Section 564(b)(1) of the Act, 21 U.S.C. section 360bbb-3(b)(1), unless the authorization is terminated or revoked.  Performed at [facility], [address], [place], [place] [address]      Radiology Studies: CT Renal Stone Study  Result Date:  10/23/2020 CLINICAL DATA:  Left lower quadrant pain for several days, history of renal calculi EXAM: CT ABDOMEN AND PELVIS WITHOUT CONTRAST TECHNIQUE: Multidetector CT imaging of the abdomen and pelvis was performed following the standard protocol without IV contrast. COMPARISON:  09/21/2018 FINDINGS: Lower chest: No acute pleural or parenchymal lung disease. Hepatobiliary: 5 cm cyst superior aspect right lobe liver. 5.5 cm hypodense mass inferior aspect  right lobe liver previously shown to represent hemangioma. There are other scattered subcentimeter hypodensities within the right lobe liver that are stable and presumed to reflect small cysts. No intrahepatic duct dilation. The gallbladder is unremarkable. Pancreas: Unremarkable. No pancreatic ductal dilatation or surrounding inflammatory changes. Spleen: Normal in size without focal abnormality. Adrenals/Urinary Tract: Right renal cyst again noted. No urinary tract calculi or obstructive uropathy. Bladder is decompressed, which limits its evaluation. The adrenals are normal. Stomach/Bowel: No bowel obstruction or ileus. There is a short segment of wall thickening at the junction of the descending and sigmoid colon, with mild pericolonic fat stranding consistent with focal colitis or acute uncomplicated diverticulitis. No perforation, fluid collection, or abscess. Normal appendix right lower quadrant. Vascular/Lymphatic: Aortic atherosclerosis. No enlarged abdominal or pelvic lymph nodes. Reproductive: Uterus and bilateral adnexa are unremarkable. Other: No free fluid or free gas. Postsurgical changes from previous bilateral inguinal hernia repairs. Musculoskeletal: No acute or destructive bony lesions. Reconstructed images demonstrate no additional findings. IMPRESSION: 1. Short segment colitis versus acute uncomplicated diverticulitis at the junction of the descending and sigmoid colon. No perforation, fluid collection, or abscess. 2. No urinary tract calculi or  obstructive uropathy. 3. Stable hepatic cysts and right lobe liver hemangioma. 4.  Aortic Atherosclerosis (ICD10-I70.0). Electronically Signed   By: Randa Ngo M.D.   On: 10/23/2020 17:40    LOS: 0 days   Antonieta Pert, MD [facility]  10/24/2020, 1:54 PM

## 2020-10-24 NOTE — Progress Notes (Signed)
[facility] Inpatient Progress Note  Subjective: Patient seen for f/u LLQ pain. No BM, only passing mild flatus. "Better, but still in pain".   Objective: Vital signs in last 24 hours: Temp:  [98.1 F (36.7 C)-99 F (37.2 C)] 99 F (37.2 C) (12/30 1619) Pulse Rate:  [51-78] 63 (12/30 1619) Resp:  [16-20] 18 (12/30 1619) BP: (94-118)/(38-63) 100/48 (12/30 1619) SpO2:  [97 %-100 %] 98 % (12/30 1619) Blood pressure (!) 100/48, pulse 63, temperature 99 F (37.2 C), resp. rate 18, height 5\' 3"  (1.6 m), weight 71.9 kg, SpO2 98 %.    Intake/Output from previous day: 12/29 0701 - 12/30 0700 In: 861.9 [I.V.:775.3; IV Piggyback:86.6] Out: -   Intake/Output this shift: No intake/output data recorded.   General appearance:  NAD, but holding her abdomen. Resp: CTA Cardio:  RRR GI:  Moderately distended, BS hypoactive but present. Very mildly tender diffusely. No rebound. Extremities:  No edema.   Lab Results: Results for orders placed or performed during the hospital encounter of 10/23/20 (from the past 24 hour(s))  HIV Antibody (routine testing w rflx)     Status: None   Collection Time: 10/23/20 11:13 PM  Result Value Ref Range   HIV Screen 4th Generation wRfx Non Reactive Non Reactive  Creatinine, serum     Status: None   Collection Time: 10/23/20 11:13 PM  Result Value Ref Range   Creatinine, Ser 0.86 0.44 - 1.00 mg/dL   GFR, Estimated 10/25/20 >81 mL/min  CBC with Differential/Platelet     Status: None   Collection Time: 10/23/20 11:13 PM  Result Value Ref Range   WBC 8.3 4.0 - 10.5 K/uL   RBC 4.66 3.87 - 5.11 MIL/uL   Hemoglobin 12.4 12.0 - 15.0 g/dL   HCT 10/25/20 75.1 - 02.5 %   MCV 82.6 80.0 - 100.0 fL   MCH 26.6 26.0 - 34.0 pg   MCHC 32.2 30.0 - 36.0 g/dL   RDW 85.2 77.8 - 24.2 %   Platelets 259 150 - 400 K/uL   nRBC 0.0 0.0 - 0.2 %   Neutrophils Relative % 61 %   Neutro Abs 5.1 1.7 - 7.7 K/uL   Lymphocytes Relative 26 %   Lymphs Abs 2.2 0.7 - 4.0  K/uL   Monocytes Relative 10 %   Monocytes Absolute 0.8 0.1 - 1.0 K/uL   Eosinophils Relative 2 %   Eosinophils Absolute 0.2 0.0 - 0.5 K/uL   Basophils Relative 1 %   Basophils Absolute 0.1 0.0 - 0.1 K/uL   Immature Granulocytes 0 %   Abs Immature Granulocytes 0.02 0.00 - 0.07 K/uL  Basic metabolic panel     Status: Abnormal   Collection Time: 10/24/20  5:20 AM  Result Value Ref Range   Sodium 139 135 - 145 mmol/L   Potassium 4.6 3.5 - 5.1 mmol/L   Chloride 104 98 - 111 mmol/L   CO2 27 22 - 32 mmol/L   Glucose, Bld 86 70 - 99 mg/dL   BUN 10 8 - 23 mg/dL   Creatinine, Ser 10/26/20 0.44 - 1.00 mg/dL   Calcium 8.5 (L) 8.9 - 10.3 mg/dL   GFR, Estimated 6.14 >43 mL/min   Anion gap 8 5 - 15  CBC     Status: Abnormal   Collection Time: 10/24/20  5:20 AM  Result Value Ref Range   WBC 6.9 4.0 - 10.5 K/uL   RBC 4.32 3.87 - 5.11 MIL/uL   Hemoglobin 11.5 (L) 12.0 -  15.0 g/dL   HCT 35.9 (L) 36.0 - 46.0 %   MCV 83.1 80.0 - 100.0 fL   MCH 26.6 26.0 - 34.0 pg   MCHC 32.0 30.0 - 36.0 g/dL   RDW 15.0 11.5 - 15.5 %   Platelets 242 150 - 400 K/uL   nRBC 0.0 0.0 - 0.2 %     Recent Labs    10/23/20 1325 10/23/20 2313 10/24/20 0520  WBC 9.9 8.3 6.9  HGB 12.8 12.4 11.5*  HCT 39.0 38.5 35.9*  PLT 288 259 242   BMET Recent Labs    10/23/20 1325 10/23/20 2313 10/24/20 0520  NA 139  --  139  K 4.1  --  4.6  CL 104  --  104  CO2 26  --  27  GLUCOSE 95  --  86  BUN 11  --  10  CREATININE 0.87 0.86 0.89  CALCIUM 9.2  --  8.5*   LFT Recent Labs    10/23/20 1325  PROT 6.9  ALBUMIN 3.9  AST 17  ALT 14  ALKPHOS 59  BILITOT 0.7   PT/INR No results for input(s): LABPROT, INR in the last 72 hours. Hepatitis Panel No results for input(s): HEPBSAG, HCVAB, HEPAIGM, HEPBIGM in the last 72 hours. C-Diff No results for input(s): CDIFFTOX in the last 72 hours. No results for input(s): CDIFFPCR in the last 72 hours.   Studies/Results: CT Renal Stone Study  Result Date:  10/23/2020 CLINICAL DATA:  Left lower quadrant pain for several days, history of renal calculi EXAM: CT ABDOMEN AND PELVIS WITHOUT CONTRAST TECHNIQUE: Multidetector CT imaging of the abdomen and pelvis was performed following the standard protocol without IV contrast. COMPARISON:  09/21/2018 FINDINGS: Lower chest: No acute pleural or parenchymal lung disease. Hepatobiliary: 5 cm cyst superior aspect right lobe liver. 5.5 cm hypodense mass inferior aspect right lobe liver previously shown to represent hemangioma. There are other scattered subcentimeter hypodensities within the right lobe liver that are stable and presumed to reflect small cysts. No intrahepatic duct dilation. The gallbladder is unremarkable. Pancreas: Unremarkable. No pancreatic ductal dilatation or surrounding inflammatory changes. Spleen: Normal in size without focal abnormality. Adrenals/Urinary Tract: Right renal cyst again noted. No urinary tract calculi or obstructive uropathy. Bladder is decompressed, which limits its evaluation. The adrenals are normal. Stomach/Bowel: No bowel obstruction or ileus. There is a short segment of wall thickening at the junction of the descending and sigmoid colon, with mild pericolonic fat stranding consistent with focal colitis or acute uncomplicated diverticulitis. No perforation, fluid collection, or abscess. Normal appendix right lower quadrant. Vascular/Lymphatic: Aortic atherosclerosis. No enlarged abdominal or pelvic lymph nodes. Reproductive: Uterus and bilateral adnexa are unremarkable. Other: No free fluid or free gas. Postsurgical changes from previous bilateral inguinal hernia repairs. Musculoskeletal: No acute or destructive bony lesions. Reconstructed images demonstrate no additional findings. IMPRESSION: 1. Short segment colitis versus acute uncomplicated diverticulitis at the junction of the descending and sigmoid colon. No perforation, fluid collection, or abscess. 2. No urinary tract calculi or  obstructive uropathy. 3. Stable hepatic cysts and right lobe liver hemangioma. 4.  Aortic Atherosclerosis (ICD10-I70.0). Electronically Signed   By: Randa Ngo M.D.   On: 10/23/2020 17:40    Scheduled Inpatient Medications:   . bisacodyl  10 mg Rectal Once  . enoxaparin (LOVENOX) injection  40 mg Subcutaneous Q24H  . gabapentin  600 mg Oral TID  . [START ON 10/25/2020] lamoTRIgine  150 mg Oral QHS  . lidocaine  1 patch  Transdermal Q24H  . metroNIDAZOLE  500 mg Oral Q8H  . [START ON 10/25/2020] sertraline  100 mg Oral QHS    Continuous Inpatient Infusions:   . sodium chloride 75 mL/hr at 10/24/20 1155  . ciprofloxacin 400 mg (10/24/20 0622)    PRN Inpatient Medications:  acetaminophen, metoCLOPramide (REGLAN) injection, morphine injection, ondansetron (ZOFRAN) IV, oxyCODONE-acetaminophen  Assessment: 1. Left lower quadrant tenderness,  Possible diverticulitis vs. Segmental colitis (IBD, SCAD, ischemic colitis (not likely), colon malignancy.IV antibiotics ordered and started (Cipro + Flagyl). Has minimal improvement with anbx, IVF. Not tolerating po solids well. Has some mild constipation symptoms.  2. Chronic back pain from Osteoarthritis.  3. History of Bipolar disorder. Currently asymptomatic.  4. History of Asthma.  COVID-19 status:        Tested negative.  Recommendations:  1. Downgrade to a clear liquid diet. 2. Add Dulcolax suppository. 3. Minimize pain medications.  4. Serial examinations. 5. Colonoscopy electively when feasible, likely as outpatient. 6. Will follow with you.  Venezia Sargeant K. Alice Reichert, M.D. 10/24/2020, 7:35 PM

## 2020-10-25 LAB — COMPREHENSIVE METABOLIC PANEL
ALT: 12 U/L (ref 0–44)
AST: 19 U/L (ref 15–41)
Albumin: 3.2 g/dL — ABNORMAL LOW (ref 3.5–5.0)
Alkaline Phosphatase: 47 U/L (ref 38–126)
Anion gap: 5 (ref 5–15)
BUN: 8 mg/dL (ref 8–23)
CO2: 27 mmol/L (ref 22–32)
Calcium: 8.4 mg/dL — ABNORMAL LOW (ref 8.9–10.3)
Chloride: 107 mmol/L (ref 98–111)
Creatinine, Ser: 0.87 mg/dL (ref 0.44–1.00)
GFR, Estimated: >60 mL/min (ref >60)
Glucose, Bld: 112 mg/dL — ABNORMAL HIGH (ref 70–99)
Potassium: 4 mmol/L (ref 3.5–5.1)
Sodium: 139 mmol/L (ref 135–145)
Total Bilirubin: 0.6 mg/dL (ref 0.3–1.2)
Total Protein: 5.8 g/dL — ABNORMAL LOW (ref 6.5–8.1)

## 2020-10-25 LAB — CBC
HCT: 35.2 % — ABNORMAL LOW (ref 36.0–46.0)
Hemoglobin: 11.3 g/dL — ABNORMAL LOW (ref 12.0–15.0)
MCH: 26.5 pg (ref 26.0–34.0)
MCHC: 32.1 g/dL (ref 30.0–36.0)
MCV: 82.6 fL (ref 80.0–100.0)
Platelets: 204 10*3/uL (ref 150–400)
RBC: 4.26 MIL/uL (ref 3.87–5.11)
RDW: 14.6 % (ref 11.5–15.5)
WBC: 5.2 10*3/uL (ref 4.0–10.5)
nRBC: 0 % (ref 0.0–0.2)

## 2020-10-25 NOTE — Progress Notes (Signed)
[facility]  Subjective: Patient seen for f/u abdominal pain. Patient tolerating clear liquid diet. Still no BM but is passing flatus. Still has LLQ discomfort, but appears in no distress. Nursing service, for some reason, did not take off the order to administer a dulcolax suppository ordered on 10/24/20.   Objective: Vital signs in last 24 hours: Temp:  [98 F (36.7 C)-99 F (37.2 C)] 98 F (36.7 C) (12/31 0908) Pulse Rate:  [48-68] 48 (12/31 1203) Resp:  [15-18] 16 (12/31 1203) BP: (100-114)/(43-63) 114/51 (12/31 1203) SpO2:  [96 %-98 %] 98 % (12/31 1203) Blood pressure (!) 114/51, pulse (!) 48, temperature 98 F (36.7 C), resp. rate 16, height 5\' 3"  (1.6 m), weight 71.9 kg, SpO2 98 %.    Intake/Output from previous day: 12/30 0701 - 12/31 0700 In: 764.4 [P.O.:120; I.V.:530.9; IV Piggyback:113.5] Out: -   Intake/Output this shift: Total I/O In: 480 [P.O.:480] Out: -    General appearance:  Alert, NAD Resp: CTA no wheezes Cardio:  RRR GI: soft, tender in LLQ without rebound or guarding. BS hypoactive. Extremities: No edema.   Lab Results: Results for orders placed or performed during the hospital encounter of 10/23/20 (from the past 24 hour(s))  CBC     Status: Abnormal   Collection Time: 10/25/20  6:08 AM  Result Value Ref Range   WBC 5.2 4.0 - 10.5 K/uL   RBC 4.26 3.87 - 5.11 MIL/uL   Hemoglobin 11.3 (L) 12.0 - 15.0 g/dL   HCT 40.935.2 (L) 81.136.0 - 91.446.0 %   MCV 82.6 80.0 - 100.0 fL   MCH 26.5 26.0 - 34.0 pg   MCHC 32.1 30.0 - 36.0 g/dL   RDW 78.214.6 95.611.5 - 21.315.5 %   Platelets 204 150 - 400 K/uL   nRBC 0.0 0.0 - 0.2 %  Comprehensive metabolic panel     Status: Abnormal   Collection Time: 10/25/20  6:08 AM  Result Value Ref Range   Sodium 139 135 - 145 mmol/L   Potassium 4.0 3.5 - 5.1 mmol/L   Chloride 107 98 - 111 mmol/L   CO2 27 22 - 32 mmol/L   Glucose, Bld 112 (H) 70 - 99 mg/dL   BUN 8 8 - 23 mg/dL   Creatinine, Ser  0.860.87 0.44 - 1.00 mg/dL   Calcium 8.4 (L) 8.9 - 10.3 mg/dL   Total Protein 5.8 (L) 6.5 - 8.1 g/dL   Albumin 3.2 (L) 3.5 - 5.0 g/dL   AST 19 15 - 41 U/L   ALT 12 0 - 44 U/L   Alkaline Phosphatase 47 38 - 126 U/L   Total Bilirubin 0.6 0.3 - 1.2 mg/dL   GFR, Estimated >57>60 >84>60 mL/min   Anion gap 5 5 - 15     Recent Labs    10/23/20 2313 10/24/20 0520 10/25/20 0608  WBC 8.3 6.9 5.2  HGB 12.4 11.5* 11.3*  HCT 38.5 35.9* 35.2*  PLT 259 242 204   BMET Recent Labs    10/23/20 1325 10/23/20 2313 10/24/20 0520 10/25/20 0608  NA 139  --  139 139  K 4.1  --  4.6 4.0  CL 104  --  104 107  CO2 26  --  27 27  GLUCOSE 95  --  86 112*  BUN 11  --  10 8  CREATININE 0.87 0.86 0.89 0.87  CALCIUM 9.2  --  8.5* 8.4*   LFT Recent Labs    10/25/20 0608  PROT 5.8*  ALBUMIN 3.2*  AST 19  ALT 12  ALKPHOS 47  BILITOT 0.6   PT/INR No results for input(s): LABPROT, INR in the last 72 hours. Hepatitis Panel No results for input(s): HEPBSAG, HCVAB, HEPAIGM, HEPBIGM in the last 72 hours. C-Diff No results for input(s): CDIFFTOX in the last 72 hours. No results for input(s): CDIFFPCR in the last 72 hours.   Studies/Results: CT Renal Stone Study  Result Date: 10/23/2020 CLINICAL DATA:  Left lower quadrant pain for several days, history of renal calculi EXAM: CT ABDOMEN AND PELVIS WITHOUT CONTRAST TECHNIQUE: Multidetector CT imaging of the abdomen and pelvis was performed following the standard protocol without IV contrast. COMPARISON:  09/21/2018 FINDINGS: Lower chest: No acute pleural or parenchymal lung disease. Hepatobiliary: 5 cm cyst superior aspect right lobe liver. 5.5 cm hypodense mass inferior aspect right lobe liver previously shown to represent hemangioma. There are other scattered subcentimeter hypodensities within the right lobe liver that are stable and presumed to reflect small cysts. No intrahepatic duct dilation. The gallbladder is unremarkable. Pancreas: Unremarkable. No  pancreatic ductal dilatation or surrounding inflammatory changes. Spleen: Normal in size without focal abnormality. Adrenals/Urinary Tract: Right renal cyst again noted. No urinary tract calculi or obstructive uropathy. Bladder is decompressed, which limits its evaluation. The adrenals are normal. Stomach/Bowel: No bowel obstruction or ileus. There is a short segment of wall thickening at the junction of the descending and sigmoid colon, with mild pericolonic fat stranding consistent with focal colitis or acute uncomplicated diverticulitis. No perforation, fluid collection, or abscess. Normal appendix right lower quadrant. Vascular/Lymphatic: Aortic atherosclerosis. No enlarged abdominal or pelvic lymph nodes. Reproductive: Uterus and bilateral adnexa are unremarkable. Other: No free fluid or free gas. Postsurgical changes from previous bilateral inguinal hernia repairs. Musculoskeletal: No acute or destructive bony lesions. Reconstructed images demonstrate no additional findings. IMPRESSION: 1. Short segment colitis versus acute uncomplicated diverticulitis at the junction of the descending and sigmoid colon. No perforation, fluid collection, or abscess. 2. No urinary tract calculi or obstructive uropathy. 3. Stable hepatic cysts and right lobe liver hemangioma. 4.  Aortic Atherosclerosis (ICD10-I70.0). Electronically Signed   By: Randa Ngo M.D.   On: 10/23/2020 17:40    Scheduled Inpatient Medications:   . bisacodyl  10 mg Rectal Once  . enoxaparin (LOVENOX) injection  40 mg Subcutaneous Q24H  . gabapentin  600 mg Oral TID  . lamoTRIgine  150 mg Oral QHS  . lidocaine  1 patch Transdermal Q24H  . metroNIDAZOLE  500 mg Oral Q8H  . sertraline  100 mg Oral QHS    Continuous Inpatient Infusions:   . sodium chloride 75 mL/hr at 10/25/20 0256  . ciprofloxacin 400 mg (10/25/20 0618)    PRN Inpatient Medications:  acetaminophen, metoCLOPramide (REGLAN) injection, morphine injection, ondansetron  (ZOFRAN) IV, oxyCODONE-acetaminophen   Assessment: 1. Left lower quadrant tenderness, Possible diverticulitis vs. Segmental colitis (IBD, SCAD, ischemic colitis (not likely), colon malignancy Mild improvement on IV antibiotics; nontoxic presentation. I believe patient is mildly or moderately obstipated. Did not receive dulcolax suppository as ordered yesterday.  2. Chronic back pain from Osteoarthritis.  3. History of Bipolar disorder. Currently asymptomatic.  4. History of Asthma.  COVID-19 status:Tested negative.  Recommendations:  1. Continue a clear liquid diet. 2. I called day shift RN, Elmyra Ricks, to administer Dulcolax suppository. Nurse acknowledged order and verbalized intent to administer. 3. Minimize pain medications.  4. Serial examinations. 5. Colonoscopy electively when feasible, likely as outpatient. 6. Dr. Haig Prophet is covering this  weekend and will round on the patient.  Dalvin Clipper K. Norma Fredrickson, M.D. 10/25/2020, 12:57 PM

## 2020-10-25 NOTE — Progress Notes (Signed)
PROGRESS NOTE Sharon Collins  [id number] DOB: [birth year] DOA: 10/23/2020 PCP: [name], NP   Chief Complaint  Patient presents with  . Abdominal Pain  . Flank Pain    Brief Narrative: 69 year old female with history of anxiety/chronic back pain, history of nephrolithiasis several years ago presented to the ED 10/23/20 with 4-day history of pain over left lower quadrant, which is unrelieved with OTC medication without nausea vomiting or diarrhea. ED Course: On arrival, she was afebrile, BP 116/58, pulse 69 O2 sat 98% on room air.  Blood work including CBC, CMP and lipase were all within normal limits.  Urinalysis without pyuria Imaging: CT renal stone study shows short segment colitis versus acute uncomplicated diverticulitis at the junction of the descending and sigmoid colon without perforation fluid collection or abscess. Patient treated with pain medication and given IV antibiotics however due to intractable pain, hospitalist consulted for admission for pain control.   Subjective:  Seen this morning she was tolerating clear liquid diet but she was complaining of nausea.  No bowel movements for several days but reports she has not been eating well since Saturday when she started to have abdominal pain.   Still with abdominal pain on the left side.  Assessment & Plan:  Left lower quadrant abdominal pain and imaging showing acute colitis versus diverticulitis versus ischemic colitis , appreciate GI input, CT scan of the abdomen reviewed.  Continue clear liquid diet and advance as tolerated as per GI.  Decreased suppository x1 today.  Continue gentle IV hydration, pain control antiemetics, minimize narcotic.    Back pain he reports she has had chronic back issues from degenerative disease.  Denies incontinence numbness tingling or weakness.  Likely worsened in the setting of her abdominal pain.  Continue pain control lidocaine patch hot pack.  Anxiety and depression/bipolar  disorder: Mood is stable. Prediabetes: Diet control outpatient follow-up History of asthma respiratory status stable.  Nutrition: Diet Order            Diet clear liquid Room service appropriate? Yes; Fluid consistency: Thin  Diet effective now                  Body mass index is 28.08 kg/m.   DVT prophylaxis: enoxaparin (LOVENOX) injection 40 mg Start: 10/23/20 1915 Code Status:   Code Status: Full Code  Family Communication: plan of care discussed with patient at bedside.  Status is: admitted as Observation-changed to inpatient,  Patient remains inpatient given her expected hospital stay more than 2 midnights for ongoing management of abdominal pain and for ongoing IV hydration due to poor diet tolerance and ongoing abdominal pain  Dispo: The patient is from: Home              Anticipated d/c is to: Home              Anticipated d/c date is: 1-2 days              Patient currently is not medically stable to d/c.  Consultants:see note  Procedures:see note  Culture/Microbiology No results found for: SDES, SPECREQUEST, CULT, REPTSTATUS  Other culture-see note  Medications: Scheduled Meds: . bisacodyl  10 mg Rectal Once  . enoxaparin (LOVENOX) injection  40 mg Subcutaneous Q24H  . gabapentin  600 mg Oral TID  . lamoTRIgine  150 mg Oral QHS  . lidocaine  1 patch Transdermal Q24H  . metroNIDAZOLE  500 mg Oral Q8H  . sertraline  100 mg Oral QHS  Continuous Infusions: . sodium chloride 75 mL/hr at 10/25/20 0256  . ciprofloxacin 400 mg (10/25/20 0618)    Antimicrobials: Anti-infectives (From admission, onward)   Start     Dose/Rate Route Frequency Ordered Stop   10/24/20 0700  ciprofloxacin (CIPRO) IVPB 400 mg        400 mg 200 mL/hr over 60 Minutes Intravenous Every 12 hours 10/23/20 1900     10/24/20 0400  metroNIDAZOLE (FLAGYL) tablet 500 mg        500 mg Oral Every 8 hours 10/23/20 1900     10/23/20 1845  ciprofloxacin (CIPRO) IVPB 400 mg        400 mg 200  mL/hr over 60 Minutes Intravenous  Once 10/23/20 1838 10/23/20 2038   10/23/20 1845  metroNIDAZOLE (FLAGYL) IVPB 500 mg        500 mg 100 mL/hr over 60 Minutes Intravenous  Once 10/23/20 1838 10/23/20 2137     Objective: Vitals: Today's Vitals   10/25/20 0723 10/25/20 0840 10/25/20 0908 10/25/20 1203  BP:   (!) 114/43 (!) 114/51  Pulse:   (!) 54 (!) 48  Resp:   18 16  Temp:   98 F (36.7 C)   TempSrc:      SpO2:   98% 98%  Weight:      Height:      PainSc: 2  5       Intake/Output Summary (Last 24 hours) at 10/25/2020 1412 Last data filed at 10/25/2020 1010 Gross per 24 hour  Intake 1124.41 ml  Output --  Net 1124.41 ml   Filed Weights   10/23/20 1322  Weight: 71.9 kg   Weight change:   Intake/Output from previous day: 12/30 0701 - 12/31 0700 In: 764.4 [P.O.:120; I.V.:530.9; IV Piggyback:113.5] Out: -  Intake/Output this shift: Total I/O In: 480 [P.O.:480] Out: -   Examination: General exam: AAOx3, ill looking, NAD, weak appearing. HEENT:Oral mucosa moist, Ear/Nose WNL grossly, dentition normal. Respiratory system: bilaterally clear,no wheezing or crackles,no use of accessory muscle Cardiovascular system: S1 & S2 +, No JVD,. Gastrointestinal system: Abdomen soft, T acidender on left side,ND, BS+ Nervous System:Alert, awake, moving extremities and grossly nonfocal Extremities: No edema, distal peripheral pulses palpable.  Skin: No rashes,no icterus. MSK: Normal muscle bulk,tone, power  Data Reviewed: I have personally reviewed following labs and imaging studies CBC: Recent Labs  Lab 10/23/20 1325 10/23/20 2313 10/24/20 0520 10/25/20 0608  WBC 9.9 8.3 6.9 5.2  NEUTROABS  --  5.1  --   --   HGB 12.8 12.4 11.5* 11.3*  HCT 39.0 38.5 35.9* 35.2*  MCV 81.8 82.6 83.1 82.6  PLT 288 259 242 204   Basic Metabolic Panel: Recent Labs  Lab 10/23/20 1325 10/23/20 2313 10/24/20 0520 10/25/20 0608  NA 139  --  139 139  K 4.1  --  4.6 4.0  CL 104  --  104  107  CO2 26  --  27 27  GLUCOSE 95  --  86 112*  BUN 11  --  10 8  CREATININE 0.87 0.86 0.89 0.87  CALCIUM 9.2  --  8.5* 8.4*   GFR: Estimated Creatinine Clearance: 58 mL/min (by C-G formula based on SCr of 0.87 mg/dL). Liver Function Tests: Recent Labs  Lab 10/23/20 1325 10/25/20 0608  AST 17 19  ALT 14 12  ALKPHOS 59 47  BILITOT 0.7 0.6  PROT 6.9 5.8*  ALBUMIN 3.9 3.2*   Comments Recent Labs  Lab 10/23/20 1325  LIPASE 22   No results for input(s): AMMONIA in the last 168 hours. Coagulation Profile: No results for input(s): INR, PROTIME in the last 168 hours. Cardiac Enzymes: No results for input(s): CKTOTAL, CKMB, CKMBINDEX, TROPONINI in the last 168 hours. BNP (last 3 results) No results for input(s): PROBNP in the last 8760 hours. HbA1C: No results for input(s): HGBA1C in the last 72 hours. CBG: No results for input(s): GLUCAP in the last 168 hours. Lipid Profile: No results for input(s): CHOL, HDL, LDLCALC, TRIG, CHOLHDL, LDLDIRECT in the last 72 hours. Thyroid Function Tests: No results for input(s): TSH, T4TOTAL, FREET4, T3FREE, THYROIDAB in the last 72 hours. Anemia Panel: No results for input(s): VITAMINB12, FOLATE, FERRITIN, TIBC, IRON, RETICCTPCT in the last 72 hours. Sepsis Labs: No results for input(s): PROCALCITON, LATICACIDVEN in the last 168 hours.  Recent Results (from the past 240 hour(s))  Resp Panel by RT-PCR (Flu A&B, Covid) Nasopharyngeal Swab     Status: None   Collection Time: 10/23/20  5:00 PM   Specimen: Nasopharyngeal Swab; Nasopharyngeal(NP) swabs in vial transport medium  Result Value Ref Range Status   SARS Coronavirus 2 by RT PCR NEGATIVE NEGATIVE Final    Comment: (NOTE) SARS-CoV-2 target nucleic acids are NOT DETECTED.  The SARS-CoV-2 RNA is generally detectable in upper respiratory specimens during the acute phase of infection. The lowest concentration of SARS-CoV-2 viral copies this assay can detect is 138 copies/mL. A  negative result does not preclude SARS-Cov-2 infection and should not be used as the sole basis for treatment or other patient management decisions. A negative result may occur with  improper specimen collection/handling, submission of specimen other than nasopharyngeal swab, presence of viral mutation(s) within the areas targeted by this assay, and inadequate number of viral copies(<138 copies/mL). A negative result must be combined with clinical observations, patient history, and epidemiological information. The expected result is Negative.  Fact Sheet for Patients:  EntrepreneurPulse.com.au  Fact Sheet for Healthcare Providers:  IncredibleEmployment.be  This test is no t yet approved or cleared by the [place] FDA and  has been authorized for detection and/or diagnosis of SARS-CoV-2 by FDA under an Emergency Use Authorization (EUA). This EUA will remain  in effect (meaning this test can be used) for the duration of the COVID-19 declaration under Section 564(b)(1) of the Act, 21 U.S.C.section 360bbb-3(b)(1), unless the authorization is terminated  or revoked sooner.       Influenza A by PCR NEGATIVE NEGATIVE Final   Influenza B by PCR NEGATIVE NEGATIVE Final    Comment: (NOTE) The Xpert Xpress SARS-CoV-2/FLU/RSV plus assay is intended as an aid in the diagnosis of influenza from Nasopharyngeal swab specimens and should not be used as a sole basis for treatment. Nasal washings and aspirates are unacceptable for Xpert Xpress SARS-CoV-2/FLU/RSV testing.  Fact Sheet for Patients: EntrepreneurPulse.com.au  Fact Sheet for Healthcare Providers: IncredibleEmployment.be  This test is not yet approved or cleared by the [place] FDA and has been authorized for detection and/or diagnosis of SARS-CoV-2 by FDA under an Emergency Use Authorization (EUA). This EUA will remain in effect (meaning this test can  be used) for the duration of the COVID-19 declaration under Section 564(b)(1) of the Act, 21 U.S.C. section 360bbb-3(b)(1), unless the authorization is terminated or revoked.  Performed at [facility], [address], [place], [place] [address]      Radiology Studies: CT Renal Stone Study  Result Date: 10/23/2020 CLINICAL DATA:  Left lower quadrant pain for several days,  history of renal calculi EXAM: CT ABDOMEN AND PELVIS WITHOUT CONTRAST TECHNIQUE: Multidetector CT imaging of the abdomen and pelvis was performed following the standard protocol without IV contrast. COMPARISON:  09/21/2018 FINDINGS: Lower chest: No acute pleural or parenchymal lung disease. Hepatobiliary: 5 cm cyst superior aspect right lobe liver. 5.5 cm hypodense mass inferior aspect right lobe liver previously shown to represent hemangioma. There are other scattered subcentimeter hypodensities within the right lobe liver that are stable and presumed to reflect small cysts. No intrahepatic duct dilation. The gallbladder is unremarkable. Pancreas: Unremarkable. No pancreatic ductal dilatation or surrounding inflammatory changes. Spleen: Normal in size without focal abnormality. Adrenals/Urinary Tract: Right renal cyst again noted. No urinary tract calculi or obstructive uropathy. Bladder is decompressed, which limits its evaluation. The adrenals are normal. Stomach/Bowel: No bowel obstruction or ileus. There is a short segment of wall thickening at the junction of the descending and sigmoid colon, with mild pericolonic fat stranding consistent with focal colitis or acute uncomplicated diverticulitis. No perforation, fluid collection, or abscess. Normal appendix right lower quadrant. Vascular/Lymphatic: Aortic atherosclerosis. No enlarged abdominal or pelvic lymph nodes. Reproductive: Uterus and bilateral adnexa are unremarkable. Other: No free fluid or free gas. Postsurgical changes from previous bilateral inguinal hernia  repairs. Musculoskeletal: No acute or destructive bony lesions. Reconstructed images demonstrate no additional findings. IMPRESSION: 1. Short segment colitis versus acute uncomplicated diverticulitis at the junction of the descending and sigmoid colon. No perforation, fluid collection, or abscess. 2. No urinary tract calculi or obstructive uropathy. 3. Stable hepatic cysts and right lobe liver hemangioma. 4.  Aortic Atherosclerosis (ICD10-I70.0). Electronically Signed   By: Randa Ngo M.D.   On: 10/23/2020 17:40    LOS: 1 day   Antonieta Pert, MD [facility]  10/25/2020, 2:12 PM

## 2020-10-26 MED ORDER — PSYLLIUM 95 % PO PACK
1.0000 | PACK | Freq: Every day | ORAL | Status: DC
  Administered 2020-10-27: 1 via ORAL
  Filled 2020-10-26 (×4): qty 1

## 2020-10-26 MED ORDER — LAMOTRIGINE 100 MG PO TABS
150.0000 mg | ORAL_TABLET | Freq: Every day | ORAL | Status: DC
  Administered 2020-10-26 – 2020-10-29 (×4): 150 mg via ORAL
  Filled 2020-10-26 (×2): qty 1.5
  Filled 2020-10-26: qty 6
  Filled 2020-10-26 (×2): qty 1.5

## 2020-10-26 MED ORDER — CIPROFLOXACIN HCL 500 MG PO TABS
500.0000 mg | ORAL_TABLET | Freq: Two times a day (BID) | ORAL | Status: DC
  Administered 2020-10-26 – 2020-10-27 (×2): 500 mg via ORAL
  Filled 2020-10-26 (×2): qty 1

## 2020-10-26 NOTE — Progress Notes (Signed)
GI Inpatient Follow-up Note  Subjective:  Patient with bowel movement after suppository and describes it as long and thin. Original pain is improved.  Scheduled Inpatient Medications:  . ciprofloxacin  500 mg Oral BID  . enoxaparin (LOVENOX) injection  40 mg Subcutaneous Q24H  . gabapentin  600 mg Oral TID  . lamoTRIgine  150 mg Oral Daily  . lidocaine  1 patch Transdermal Q24H  . metroNIDAZOLE  500 mg Oral Q8H  . sertraline  100 mg Oral QHS    Continuous Inpatient Infusions:    PRN Inpatient Medications:  acetaminophen, metoCLOPramide (REGLAN) injection, ondansetron (ZOFRAN) IV, oxyCODONE-acetaminophen  Review of Systems:  Review of Systems  Constitutional: Negative for chills and fever.  Respiratory: Negative for cough.   Cardiovascular: Negative for chest pain.  Gastrointestinal: Positive for abdominal pain. Negative for blood in stool, constipation, diarrhea, heartburn, melena and nausea.  Musculoskeletal: Positive for back pain.  Skin: Negative for rash.  Neurological: Negative for focal weakness.  Psychiatric/Behavioral: Negative for substance abuse.  All other systems reviewed and are negative.    Physical Examination: BP 111/62 (BP Location: Right Arm)   Pulse (!) 51   Temp 98 F (36.7 C) (Oral)   Resp 19   Ht 5\' 3"  (1.6 m)   Wt 71.9 kg   LMP  (LMP Unknown)   SpO2 100%   BMI 28.08 kg/m  Gen: NAD, alert and oriented x 4 HEENT: PEERLA, EOMI, Neck: supple, no JVD or thyromegaly Chest: No respiratory distress Abd: soft, non-distended, some mild left lower quadrant pain Ext: no edema Skin: no rash or lesions noted Lymph: no lymphadenopathy  Data: Lab Results  Component Value Date   WBC 5.2 10/25/2020   HGB 11.3 (L) 10/25/2020   HCT 35.2 (L) 10/25/2020   MCV 82.6 10/25/2020   PLT 204 10/25/2020   Recent Labs  Lab 10/23/20 2313 10/24/20 0520 10/25/20 0608  HGB 12.4 11.5* 11.3*   Lab Results  Component Value Date   NA 139 10/25/2020   K 4.0  10/25/2020   CL 107 10/25/2020   CO2 27 10/25/2020   BUN 8 10/25/2020   CREATININE 0.87 10/25/2020   Lab Results  Component Value Date   ALT 12 10/25/2020   AST 19 10/25/2020   ALKPHOS 47 10/25/2020   BILITOT 0.6 10/25/2020   No results for input(s): APTT, INR, PTT in the last 168 hours. Assessment/Plan: Ms. Fulop is a 70 y.o. lady with apparent diverticulitis with improvement with antibiotics.  Recommendations:  - ok to advance diet - please minimize IV pain medicines - continue antibiotics, PO is fine - start fiber supplementation - will need colonoscopy as an outpatient  Will continue to follow. Please call with any questions or concerns.  78 MD, MPH [facility] GI

## 2020-10-26 NOTE — Progress Notes (Addendum)
PROGRESS NOTE Sharon Collins  [id number] DOB: 1951/05/16 DOA: 10/23/2020 PCP: Doreene Nest, NP   Chief Complaint  Patient presents with  . Abdominal Pain  . Flank Pain    Brief Narrative: 70 year old female with history of anxiety/chronic back pain, history of nephrolithiasis several years ago presented to the ED 10/23/20 with 4-day history of pain over left lower quadrant, which is unrelieved with OTC medication without nausea vomiting or diarrhea. ED Course: On arrival, she was afebrile, BP 116/58, pulse 69 O2 sat 98% on room air.  Blood work including CBC, CMP and lipase were all within normal limits.  Urinalysis without pyuria Imaging: CT renal stone study shows short segment colitis versus acute uncomplicated diverticulitis at the junction of the descending and sigmoid colon without perforation fluid collection or abscess. Patient treated with pain medication and given IV antibiotics however due to intractable pain, hospitalist consulted for admission for pain control.   Subjective: No new complaints some left abdomen pain  Diet advanced to soft and ate half then had pain and nausea Had BM yesterday after dulcolax pr  Assessment & Plan:  Left lower quadrant abdominal pain and imaging showing acute colitis versus diverticulitis versus ischemic colitis , appreciate GI input, CT scan of the abdomen reviewed.  Cont soft diet , add metamucil, d/c iv opiates cont po and minimize opiates. Had BM yesterday.does not feel ready for home today. Iv lost. changed to po cipro, cont po flagyl.  Back pain he reports she has had chronic back issues from degenerative disease.stable, cont pain control She takes percocet and sees pain management  Anxiety and depression/bipolar disorder: mood stable cont home meds Prediabetes: Diet control outpatient follow-up History of asthma respiratory status stable.  Nutrition: Diet Order            DIET SOFT Room service appropriate? Yes; Fluid  consistency: Thin  Diet effective now                  Body mass index is 28.08 kg/m.   DVT prophylaxis: enoxaparin (LOVENOX) injection 40 mg Start: 10/23/20 1915 Code Status:   Code Status: Full Code  Family Communication: plan of care discussed with patient at bedside.  Status is: admitted as Observation-changed to inpatient,  Patient remains inpatient given her expected hospital stay more than 2 midnights for ongoing management of abdominal pain and for ongoing IV hydration due to poor diet tolerance and ongoing abdominal pain  Dispo: The patient is from: Home              Anticipated d/c is to: Home              Anticipated d/c date is: tomorrow if tolerates diet              Patient currently is not medically stable to d/c.  Consultants:see note  Procedures:see note  Culture/Microbiology No results found for: SDES, SPECREQUEST, CULT, REPTSTATUS  Other culture-see note  Medications: Scheduled Meds: . ciprofloxacin  500 mg Oral BID  . enoxaparin (LOVENOX) injection  40 mg Subcutaneous Q24H  . gabapentin  600 mg Oral TID  . lamoTRIgine  150 mg Oral Daily  . lidocaine  1 patch Transdermal Q24H  . metroNIDAZOLE  500 mg Oral Q8H  . psyllium  1 packet Oral Daily  . sertraline  100 mg Oral QHS   Continuous Infusions:   Antimicrobials: Anti-infectives (From admission, onward)   Start     Dose/Rate Route Frequency Ordered Stop  10/26/20 1800  ciprofloxacin (CIPRO) tablet 500 mg        500 mg Oral 2 times daily 10/26/20 1036     10/24/20 0700  ciprofloxacin (CIPRO) IVPB 400 mg  Status:  Discontinued        400 mg 200 mL/hr over 60 Minutes Intravenous Every 12 hours 10/23/20 1900 10/26/20 1036   10/24/20 0400  metroNIDAZOLE (FLAGYL) tablet 500 mg        500 mg Oral Every 8 hours 10/23/20 1900     10/23/20 1845  ciprofloxacin (CIPRO) IVPB 400 mg        400 mg 200 mL/hr over 60 Minutes Intravenous  Once 10/23/20 1838 10/23/20 2038   10/23/20 1845  metroNIDAZOLE  (FLAGYL) IVPB 500 mg        500 mg 100 mL/hr over 60 Minutes Intravenous  Once 10/23/20 1838 10/23/20 2137     Objective: Vitals: Today's Vitals   10/26/20 0438 10/26/20 0753 10/26/20 0812 10/26/20 1149  BP: 113/90  126/81 111/62  Pulse: (!) 53  (!) 50 (!) 51  Resp: 16  18 19   Temp: 97.8 F (36.6 C)  98 F (36.7 C) 98 F (36.7 C)  TempSrc:   Oral Oral  SpO2: 100%  100% 100%  Weight:      Height:      PainSc:  3      No intake or output data in the 24 hours ending 10/26/20 1456 Filed Weights   10/23/20 1322  Weight: 71.9 kg   Weight change:   Intake/Output from previous day: 12/31 0701 - 01/01 0700 In: 480 [P.O.:480] Out: -  Intake/Output this shift: No intake/output data recorded.  Examination: General exam: AAOx3 , NAD, weak appearing. HEENT:Oral mucosa moist, Ear/Nose WNL grossly, dentition normal. Respiratory system: bilaterally clear,no wheezing or crackles,no use of accessory muscle Cardiovascular system: S1 & S2 +, No JVD,. Gastrointestinal system: Abdomen soft,  Mildly tender on left side Nervous System:Alert, awake, moving extremities and grossly nonfocal Extremities: No edema, distal peripheral pulses palpable.  Skin: No rashes,no icterus. MSK: Normal muscle bulk,tone, power Data Reviewed: I have personally reviewed following labs and imaging studies CBC: Recent Labs  Lab 10/23/20 1325 10/23/20 2313 10/24/20 0520 10/25/20 0608  WBC 9.9 8.3 6.9 5.2  NEUTROABS  --  5.1  --   --   HGB 12.8 12.4 11.5* 11.3*  HCT 39.0 38.5 35.9* 35.2*  MCV 81.8 82.6 83.1 82.6  PLT 288 259 242 0000000   Basic Metabolic Panel: Recent Labs  Lab 10/23/20 1325 10/23/20 2313 10/24/20 0520 10/25/20 0608  NA 139  --  139 139  K 4.1  --  4.6 4.0  CL 104  --  104 107  CO2 26  --  27 27  GLUCOSE 95  --  86 112*  BUN 11  --  10 8  CREATININE 0.87 0.86 0.89 0.87  CALCIUM 9.2  --  8.5* 8.4*   GFR: Estimated Creatinine Clearance: 58 mL/min (by C-G formula based on SCr of  0.87 mg/dL). Liver Function Tests: Recent Labs  Lab 10/23/20 1325 10/25/20 0608  AST 17 19  ALT 14 12  ALKPHOS 59 47  BILITOT 0.7 0.6  PROT 6.9 5.8*  ALBUMIN 3.9 3.2*   Comments Recent Labs  Lab 10/23/20 1325  LIPASE 22   No results for input(s): AMMONIA in the last 168 hours. Coagulation Profile: No results for input(s): INR, PROTIME in the last 168 hours. Cardiac Enzymes: No results for input(s):  CKTOTAL, CKMB, CKMBINDEX, TROPONINI in the last 168 hours. BNP (last 3 results) No results for input(s): PROBNP in the last 8760 hours. HbA1C: No results for input(s): HGBA1C in the last 72 hours. CBG: No results for input(s): GLUCAP in the last 168 hours. Lipid Profile: No results for input(s): CHOL, HDL, LDLCALC, TRIG, CHOLHDL, LDLDIRECT in the last 72 hours. Thyroid Function Tests: No results for input(s): TSH, T4TOTAL, FREET4, T3FREE, THYROIDAB in the last 72 hours. Anemia Panel: No results for input(s): VITAMINB12, FOLATE, FERRITIN, TIBC, IRON, RETICCTPCT in the last 72 hours. Sepsis Labs: No results for input(s): PROCALCITON, LATICACIDVEN in the last 168 hours.  Recent Results (from the past 240 hour(s))  Resp Panel by RT-PCR (Flu A&B, Covid) Nasopharyngeal Swab     Status: None   Collection Time: 10/23/20  5:00 PM   Specimen: Nasopharyngeal Swab; Nasopharyngeal(NP) swabs in vial transport medium  Result Value Ref Range Status   SARS Coronavirus 2 by RT PCR NEGATIVE NEGATIVE Final    Comment: (NOTE) SARS-CoV-2 target nucleic acids are NOT DETECTED.  The SARS-CoV-2 RNA is generally detectable in upper respiratory specimens during the acute phase of infection. The lowest concentration of SARS-CoV-2 viral copies this assay can detect is 138 copies/mL. A negative result does not preclude SARS-Cov-2 infection and should not be used as the sole basis for treatment or other patient management decisions. A negative result may occur with  improper specimen  collection/handling, submission of specimen other than nasopharyngeal swab, presence of viral mutation(s) within the areas targeted by this assay, and inadequate number of viral copies(<138 copies/mL). A negative result must be combined with clinical observations, patient history, and epidemiological information. The expected result is Negative.  Fact Sheet for Patients:  EntrepreneurPulse.com.au  Fact Sheet for Healthcare Providers:  IncredibleEmployment.be  This test is no t yet approved or cleared by the [place] FDA and  has been authorized for detection and/or diagnosis of SARS-CoV-2 by FDA under an Emergency Use Authorization (EUA). This EUA will remain  in effect (meaning this test can be used) for the duration of the COVID-19 declaration under Section 564(b)(1) of the Act, 21 U.S.C.section 360bbb-3(b)(1), unless the authorization is terminated  or revoked sooner.       Influenza A by PCR NEGATIVE NEGATIVE Final   Influenza B by PCR NEGATIVE NEGATIVE Final    Comment: (NOTE) The Xpert Xpress SARS-CoV-2/FLU/RSV plus assay is intended as an aid in the diagnosis of influenza from Nasopharyngeal swab specimens and should not be used as a sole basis for treatment. Nasal washings and aspirates are unacceptable for Xpert Xpress SARS-CoV-2/FLU/RSV testing.  Fact Sheet for Patients: EntrepreneurPulse.com.au  Fact Sheet for Healthcare Providers: IncredibleEmployment.be  This test is not yet approved or cleared by the [place] FDA and has been authorized for detection and/or diagnosis of SARS-CoV-2 by FDA under an Emergency Use Authorization (EUA). This EUA will remain in effect (meaning this test can be used) for the duration of the COVID-19 declaration under Section 564(b)(1) of the Act, 21 U.S.C. section 360bbb-3(b)(1), unless the authorization is terminated or revoked.  Performed at [facility], [address] [place], [place] [address]      Radiology Studies: No results found.  LOS: 2 days   Antonieta Pert, MD [facility]  10/26/2020, 2:56 PM

## 2020-10-27 MED ORDER — CIPROFLOXACIN IN D5W 400 MG/200ML IV SOLN
400.0000 mg | Freq: Two times a day (BID) | INTRAVENOUS | Status: DC
  Administered 2020-10-27: 400 mg via INTRAVENOUS
  Filled 2020-10-27 (×3): qty 200

## 2020-10-27 MED ORDER — METRONIDAZOLE 500 MG PO TABS: 500.0000 mg | ORAL_TABLET | Freq: Three times a day (TID) | ORAL | 0 refills | Status: AC

## 2020-10-27 MED ORDER — PSYLLIUM 95 % PO PACK: 1.0000 | PACK | Freq: Every day | ORAL | 0 refills | Status: AC

## 2020-10-27 MED ORDER — METRONIDAZOLE IN NACL 5-0.79 MG/ML-% IV SOLN
500.0000 mg | Freq: Three times a day (TID) | INTRAVENOUS | Status: DC
  Administered 2020-10-27 – 2020-10-28 (×2): 500 mg via INTRAVENOUS
  Filled 2020-10-27 (×6): qty 100

## 2020-10-27 MED ORDER — CIPROFLOXACIN HCL 500 MG PO TABS: 500.0000 mg | ORAL_TABLET | Freq: Two times a day (BID) | ORAL | 0 refills | Status: AC

## 2020-10-27 NOTE — Progress Notes (Signed)
PROGRESS NOTE Sharon Collins  [id number] DOB: 1950/11/13 DOA: 10/23/2020 PCP: [name], NP   Chief Complaint  Patient presents with  . Abdominal Pain  . Flank Pain    Brief Narrative: 70 year old female with history of anxiety/chronic back pain, history of nephrolithiasis several years ago presented to the ED 10/23/20 with 4-day history of pain over left lower quadrant, which is unrelieved with OTC medication without nausea vomiting or diarrhea. ED Course: On arrival, she was afebrile, BP 116/58, pulse 69 O2 sat 98% on room air.  Blood work including CBC, CMP and lipase were all within normal limits.  Urinalysis without pyuria Imaging: CT renal stone study shows short segment colitis versus acute uncomplicated diverticulitis at the junction of the descending and sigmoid colon without perforation fluid collection or abscess. Patient treated with pain medication and given IV antibiotics however due to intractable pain, hospitalist consulted for admission for pain control. 53-year-old female with history of anxiety/chronic back pain, history of nephrolithiasis several years ago presented to the ED 10/23/20 with 4-day history of pain over left lower quadrant, which is unrelieved with OTC medication without nausea vomiting or diarrhea. Patient was treated with IV Cipro and Flagyl while here seen by GI diet was slowly advanced.    Subjective:  Patient was doing well this morning tolerating diet and plan was for discharge home but she started having nausea, needing IV Zofran, does not feel ready for home today.  Discharge canceled.  Assessment & Plan:  Left lower quadrant abdominal pain 2/2 acute colitis vs diverticulitis vs ischemic colitis ( less likely):  Seen by GI, CT scan of the abdomen reviewed.On soft diet but complains of persistent nausea today, will switch to IV Cipro/Flagyl  . Hold off discharge today.RN to notify GI.   Back pain she reports she has had chronic back  issues from degenerative disease.continue pain control, she usually takes Percocet and sees pain management  Anxiety and depression/bipolar disorder:  Mood is stable. Prediabetes: Diet control and outpatient follow-up History of asthma respiratory status is stable.  Nutrition: Diet Order            Diet - low sodium heart healthy           DIET SOFT Room service appropriate? Yes; Fluid consistency: Thin  Diet effective now                  Body mass index is 28.08 kg/m.   DVT prophylaxis: enoxaparin (LOVENOX) injection 40 mg Start: 10/23/20 1915 Code Status:   Code Status: Full Code  Family Communication: plan of care discussed with patient at bedside.  Status is: admitted as Observation-changed to inpatient,  Patient remains inpatient given her expected hospital stay more than 2 midnights for ongoing management of abdominal pain and for ongoing IV hydration due to poor diet tolerance and ongoing abdominal pain  Dispo: The patient is from: Home              Anticipated d/c is to: Home              Anticipated d/c date is: tomorrow if tolerates diet and no nausea.              Patient currently is not medically stable to d/c.  Consultants:see note  Procedures:see note  Culture/Microbiology No results found for: SDES, SPECREQUEST, CULT, REPTSTATUS  Other culture-see note  Medications: Scheduled Meds: . ciprofloxacin  500 mg Oral BID  . enoxaparin (LOVENOX) injection  40  mg Subcutaneous Q24H  . gabapentin  600 mg Oral TID  . lamoTRIgine  150 mg Oral Daily  . lidocaine  1 patch Transdermal Q24H  . metroNIDAZOLE  500 mg Oral Q8H  . psyllium  1 packet Oral Daily  . sertraline  100 mg Oral QHS   Continuous Infusions:   Antimicrobials: Anti-infectives (From admission, onward)   Start     Dose/Rate Route Frequency Ordered Stop   10/27/20 0000  metroNIDAZOLE (FLAGYL) 500 MG tablet        500 mg Oral Every 8 hours 10/27/20 1017 11/06/20 2359   10/27/20 0000   ciprofloxacin (CIPRO) 500 MG tablet        500 mg Oral 2 times daily 10/27/20 1017 11/06/20 2359   10/26/20 1800  ciprofloxacin (CIPRO) tablet 500 mg        500 mg Oral 2 times daily 10/26/20 1036     10/24/20 0700  ciprofloxacin (CIPRO) IVPB 400 mg  Status:  Discontinued        400 mg 200 mL/hr over 60 Minutes Intravenous Every 12 hours 10/23/20 1900 10/26/20 1036   10/24/20 0400  metroNIDAZOLE (FLAGYL) tablet 500 mg        500 mg Oral Every 8 hours 10/23/20 1900     10/23/20 1845  ciprofloxacin (CIPRO) IVPB 400 mg        400 mg 200 mL/hr over 60 Minutes Intravenous  Once 10/23/20 1838 10/23/20 2038   10/23/20 1845  metroNIDAZOLE (FLAGYL) IVPB 500 mg        500 mg 100 mL/hr over 60 Minutes Intravenous  Once 10/23/20 1838 10/23/20 2137     Objective: Vitals: Today's Vitals   10/27/20 0400 10/27/20 0537 10/27/20 0807 10/27/20 0956  BP: (!) 105/37  (!) 105/53   Pulse: (!) 53  (!) 52   Resp: 14  18   Temp: 98.1 F (36.7 C)  98.3 F (36.8 C)   TempSrc:      SpO2: 99%  99%   Weight:      Height:      PainSc:  0-No pain  0-No pain   No intake or output data in the 24 hours ending 10/27/20 1445 Filed Weights   10/23/20 1322  Weight: 71.9 kg   Weight change:   Intake/Output from previous day: No intake/output data recorded. Intake/Output this shift: No intake/output data recorded.  Examination: General exam: AAOx3 , NAD, weak appearing. HEENT:Oral mucosa moist, Ear/Nose WNL grossly, dentition normal. Respiratory system: bilaterally clear,no wheezing or crackles,no use of accessory muscle Cardiovascular system: S1 & S2 +, No JVD,. Gastrointestinal system: Abdomen soft, mildly Tender left abdomen,ND, BS+ Nervous System:Alert, awake, moving extremities and grossly nonfocal Extremities: No edema, distal peripheral pulses palpable.  Skin: No rashes,no icterus. MSK: Normal muscle bulk,tone, power Data Reviewed: I have personally reviewed following labs and imaging  studies CBC: Recent Labs  Lab 10/23/20 1325 10/23/20 2313 10/24/20 0520 10/25/20 0608  WBC 9.9 8.3 6.9 5.2  NEUTROABS  --  5.1  --   --   HGB 12.8 12.4 11.5* 11.3*  HCT 39.0 38.5 35.9* 35.2*  MCV 81.8 82.6 83.1 82.6  PLT 288 259 242 0000000   Basic Metabolic Panel: Recent Labs  Lab 10/23/20 1325 10/23/20 2313 10/24/20 0520 10/25/20 0608  NA 139  --  139 139  K 4.1  --  4.6 4.0  CL 104  --  104 107  CO2 26  --  27 27  GLUCOSE 95  --  86 112*  BUN 11  --  10 8  CREATININE 0.87 0.86 0.89 0.87  CALCIUM 9.2  --  8.5* 8.4*   GFR: Estimated Creatinine Clearance: 58 mL/min (by C-G formula based on SCr of 0.87 mg/dL). Liver Function Tests: Recent Labs  Lab 10/23/20 1325 10/25/20 0608  AST 17 19  ALT 14 12  ALKPHOS 59 47  BILITOT 0.7 0.6  PROT 6.9 5.8*  ALBUMIN 3.9 3.2*   Comments Recent Labs  Lab 10/23/20 1325  LIPASE 22   No results for input(s): AMMONIA in the last 168 hours. Coagulation Profile: No results for input(s): INR, PROTIME in the last 168 hours. Cardiac Enzymes: No results for input(s): CKTOTAL, CKMB, CKMBINDEX, TROPONINI in the last 168 hours. BNP (last 3 results) No results for input(s): PROBNP in the last 8760 hours. HbA1C: No results for input(s): HGBA1C in the last 72 hours. CBG: No results for input(s): GLUCAP in the last 168 hours. Lipid Profile: No results for input(s): CHOL, HDL, LDLCALC, TRIG, CHOLHDL, LDLDIRECT in the last 72 hours. Thyroid Function Tests: No results for input(s): TSH, T4TOTAL, FREET4, T3FREE, THYROIDAB in the last 72 hours. Anemia Panel: No results for input(s): VITAMINB12, FOLATE, FERRITIN, TIBC, IRON, RETICCTPCT in the last 72 hours. Sepsis Labs: No results for input(s): PROCALCITON, LATICACIDVEN in the last 168 hours.  Recent Results (from the past 240 hour(s))  Resp Panel by RT-PCR (Flu A&B, Covid) Nasopharyngeal Swab     Status: None   Collection Time: 10/23/20  5:00 PM   Specimen: Nasopharyngeal Swab;  Nasopharyngeal(NP) swabs in vial transport medium  Result Value Ref Range Status   SARS Coronavirus 2 by RT PCR NEGATIVE NEGATIVE Final    Comment: (NOTE) SARS-CoV-2 target nucleic acids are NOT DETECTED.  The SARS-CoV-2 RNA is generally detectable in upper respiratory specimens during the acute phase of infection. The lowest concentration of SARS-CoV-2 viral copies this assay can detect is 138 copies/mL. A negative result does not preclude SARS-Cov-2 infection and should not be used as the sole basis for treatment or other patient management decisions. A negative result may occur with  improper specimen collection/handling, submission of specimen other than nasopharyngeal swab, presence of viral mutation(s) within the areas targeted by this assay, and inadequate number of viral copies(<138 copies/mL). A negative result must be combined with clinical observations, patient history, and epidemiological information. The expected result is Negative.  Fact Sheet for Patients:  BloggerCourse.com  Fact Sheet for Healthcare Providers:  SeriousBroker.it  This test is no t yet approved or cleared by the [place] FDA and  has been authorized for detection and/or diagnosis of SARS-CoV-2 by FDA under an Emergency Use Authorization (EUA). This EUA will remain  in effect (meaning this test can be used) for the duration of the COVID-19 declaration under Section 564(b)(1) of the Act, 21 U.S.C.section 360bbb-3(b)(1), unless the authorization is terminated  or revoked sooner.       Influenza A by PCR NEGATIVE NEGATIVE Final   Influenza B by PCR NEGATIVE NEGATIVE Final    Comment: (NOTE) The Xpert Xpress SARS-CoV-2/FLU/RSV plus assay is intended as an aid in the diagnosis of influenza from Nasopharyngeal swab specimens and should not be used as a sole basis for treatment. Nasal washings and aspirates are unacceptable for Xpert Xpress  SARS-CoV-2/FLU/RSV testing.  Fact Sheet for Patients: BloggerCourse.com  Fact Sheet for Healthcare Providers: SeriousBroker.it  This test is not yet approved or cleared by the [place] FDA and has been authorized for detection  and/or diagnosis of SARS-CoV-2 by FDA under an Emergency Use Authorization (EUA). This EUA will remain in effect (meaning this test can be used) for the duration of the COVID-19 declaration under Section 564(b)(1) of the Act, 21 U.S.C. section 360bbb-3(b)(1), unless the authorization is terminated or revoked.  Performed at [facility], [address] [place],  [address]      Radiology Studies: No results found.  LOS: 3 days   Antonieta Pert, MD [facility]  10/27/2020, 2:45 PM

## 2020-10-27 NOTE — Progress Notes (Signed)
Pt still feeling nauseous, does not feel like she can d/c today. MD notified, discharge cancelled for today.

## 2020-10-27 NOTE — Consult Note (Signed)
BRIEF PHARMACY COMMUNICATION NOTE  70 year old female with history of anxiety/chronic back pain, history of nephrolithiasis several years ago presented to the ED 10/23/20 with 4-day history of pain over left lower quadrant. Pharmacy consulted for cipro and metronidazole PO to IV 2/2 nausea. Patient was going to discharge 1/2, however held off d/t persistent nausea.   Raiford Noble, PharmD Pharmacy Resident  10/27/2020 3:17 PM

## 2020-10-27 NOTE — Discharge Summary (Signed)
Physician Discharge Summary  Sharon Collins [id number] DOB: 10-Nov-1950 DOA: 10/23/2020  PCP: [name], NP  Admit date: 10/23/2020 Discharge date: 10/29/2020  Admitted From: hoem Disposition:  home  Recommendations for Outpatient Follow-up:  1. Follow up with PCP in 1-2 weeks 2. Please obtain BMP/CBC in one week 3. Please follow up on the following pending results:  Home Health:no  Equipment/Devices: none  Discharge Condition: Stable Code Status:   Code Status: Full Code Diet recommendation:  Diet Order            Diet - low sodium heart healthy           DIET SOFT Room service appropriate? Yes; Fluid consistency: Thin  Diet effective now                  Brief/Interim Summary:  70 year old female with history of anxiety/chronic back pain, history of nephrolithiasis several years ago presented to the ED 10/23/20 with 4-day history of pain over left lower quadrant, which is unrelieved with OTC medication without nausea vomiting or diarrhea. ED Course:On arrival, she was afebrile, BP 116/58, pulse 69 O2 sat 98% on room air. Blood work including CBC, CMP and lipase were all within normal limits. Urinalysis without pyuria Imaging:CT renal stone study shows short segment colitis versus acute uncomplicated diverticulitis at the junction of the descending and sigmoid colon without perforation fluid collection or abscess. Patient treated with pain medication and given IV antibiotics however due to intractable pain, hospitalist consulted for admission for pain control Patient is treated with IV Cipro and Flagyl while here seen by GI diet was slowly advanced.  At this time he is tolerating diet pain is stable.  Some intermittent nausea and she has a Zofran at home which she can take.  No episode of vomiting. Patient in the hospital stay got prolonged due to episode of nausea and she was monitored to make sure she is able to tolerate oral antibiotics.  At this time she is  tolerating diet twelve twelve antibiotics she'll be discharged home  Discharge Diagnoses:  Left lower quadrant abdominal 2/2 acute colitis versus diverticulitis versus ischemic colitis (less likely), appreciate GI input, CT scan of the abdomen reviewed.   No vomiting on soft diet, discussed with GI okay to discharge home and she will follow up with GI as outpatient.  she will complete cipro Flagyl course.  Patient requesting vaginal cream for yeast infection AS She gets yeast infection while on oral antibiotics   Back pain he reports she has had chronic back issues from degenerative disease.stable, cont pain control She takes percocet and sees pain management  Anxiety and depression/bipolar disorder: mood stable cont home meds Prediabetes: Diet control outpatient follow-up History of asthma respiratory status stable.  Consults:  GI   Subjective: Alert awake oriented, no vomiting tolerating diet.  had bowel movement. Discharge Exam: Vitals:   10/29/20 0346 10/29/20 0803  BP: (!) 112/51 (!) 109/43  Pulse: (!) 54 (!) 56  Resp: 15 18  Temp: 98.1 F (36.7 C) 98.1 F (36.7 C)  SpO2: 98% 96%   General exam: AAOx3 , NAD, weak appearing. HEENT:Oral mucosa moist, Ear/Nose WNL grossly, dentition normal. Respiratory system: bilaterally clear,no wheezing or crackles,no use of accessory muscle Cardiovascular system: S1 & S2 +, No JVD,. Gastrointestinal system: Abdomen soft, mildly tender left abdomen improving, ND, BS+ Nervous System:Alert, awake, moving extremities and grossly nonfocal Extremities: No edema, distal peripheral pulses palpable.  Skin: No rashes,no icterus. MSK: Normal muscle  bulk,tone, power  Discharge Instructions  Discharge Instructions    Diet - low sodium heart healthy   Complete by: As directed    Discharge instructions   Complete by: As directed    Please follow up with gi doctor - call office - no provided.  Please call call MD or return to ER for similar  or worsening recurring problem that brought you to hospital or if any fever,nausea/vomiting,abdominal pain, uncontrolled pain, chest pain,  shortness of breath or any other alarming symptoms.  Please follow-up your doctor as instructed in a week time and call the office for appointment.  Please avoid alcohol, smoking, or any other illicit substance and maintain healthy habits including taking your regular medications as prescribed.  You were cared for by a hospitalist during your hospital stay. If you have any questions about your discharge medications or the care you received while you were in the hospital after you are discharged, you can call the unit and ask to speak with the hospitalist on call if the hospitalist that took care of you is not available.  Once you are discharged, your primary care physician will handle any further medical issues. Please note that NO REFILLS for any discharge medications will be authorized once you are discharged, as it is imperative that you return to your primary care physician (or establish a relationship with a primary care physician if you do not have one) for your aftercare needs so that they can reassess your need for medications and monitor your lab values   Increase activity slowly   Complete by: As directed      Allergies as of 10/29/2020      Reactions   Codeine    Hydrocodone Nausea And Vomiting   Vicodin causes vomiting, but when takes Phenergan 0.25 mg with it, she can tolerate it.   Tramadol Nausea And Vomiting   Causes vomiting & affects her heart rhythm.      Medication List    TAKE these medications   albuterol 108 (90 Base) MCG/ACT inhaler Commonly known as: VENTOLIN HFA Inhale 2 puffs into the lungs every 6 (six) hours as needed for wheezing or shortness of breath.   azelastine 0.1 % nasal spray Commonly known as: ASTELIN Place 1 spray into both nostrils 2 (two) times daily as needed for rhinitis.   bisacodyl 10 MG  suppository Commonly known as: DULCOLAX Place 1 suppository (10 mg total) rectally daily as needed for moderate constipation.   ciprofloxacin 500 MG tablet Commonly known as: CIPRO Take 1 tablet (500 mg total) by mouth 2 (two) times daily for 10 days.   clindamycin-benzoyl peroxide gel Commonly known as: BENZACLIN Apply topically 2 (two) times daily.   desloratadine 5 MG tablet Commonly known as: CLARINEX Take 5 mg by mouth daily as needed (allergies).   etodolac 500 MG tablet Commonly known as: LODINE TAKE 1 TABLET BY MOUTH ONCE DAILY AS NEEDED FOR PAIN What changed: See the new instructions.   gabapentin 300 MG capsule Commonly known as: NEURONTIN Take 600 mg by mouth 3 (three) times daily.   hydrOXYzine 25 MG tablet Commonly known as: ATARAX/VISTARIL Take 25 mg by mouth daily as needed for anxiety (sleep).   lamoTRIgine 150 MG tablet Commonly known as: LAMICTAL Take 150 mg by mouth at bedtime.   metroNIDAZOLE 500 MG tablet Commonly known as: FLAGYL Take 1 tablet (500 mg total) by mouth every 8 (eight) hours for 10 days.   miconazole 2 % vaginal cream Commonly  known as: MONISTAT 7 Place 1 Applicatorful vaginally at bedtime.   montelukast 10 MG tablet Commonly known as: SINGULAIR Take 1 tablet (10 mg total) by mouth at bedtime. For allergies. What changed:   when to take this  reasons to take this  additional instructions   ondansetron 4 MG disintegrating tablet Commonly known as: ZOFRAN-ODT Take 1 tablet (4 mg total) by mouth every 8 (eight) hours as needed for nausea or vomiting.   oxyCODONE-acetaminophen 5-325 MG tablet Commonly known as: PERCOCET/ROXICET Take 1 tablet by mouth 2 (two) times daily as needed for moderate pain.   psyllium 95 % Pack Commonly known as: HYDROCIL/METAMUCIL Take 1 packet by mouth daily for 14 days.   QUEtiapine 100 MG tablet Commonly known as: SEROQUEL Take 100 mg by mouth at bedtime as needed (sleep).   sertraline 100  MG tablet Commonly known as: ZOLOFT Take 100 mg by mouth at bedtime.       Follow-up Information    Doreene Nest, NP Follow up in 1 week(s).   Specialty: Internal Medicine Contact information: [address] [place] [address] [phone]        Stanton Kidney, MD Follow up in 1 week(s).   Specialty: Gastroenterology Contact information: [address] [place] [address] [phone]              Allergies  Allergen Reactions  . Codeine   . Hydrocodone Nausea And Vomiting    Vicodin causes vomiting, but when takes Phenergan 0.25 mg with it, she can tolerate it.  . Tramadol Nausea And Vomiting    Causes vomiting & affects her heart rhythm.    The results of significant diagnostics from this hospitalization (including imaging, microbiology, ancillary and laboratory) are listed below for reference.    Microbiology: Recent Results (from the past 240 hour(s))  Resp Panel by RT-PCR (Flu A&B, Covid) Nasopharyngeal Swab     Status: None   Collection Time: 10/23/20  5:00 PM   Specimen: Nasopharyngeal Swab; Nasopharyngeal(NP) swabs in vial transport medium  Result Value Ref Range Status   SARS Coronavirus 2 by RT PCR NEGATIVE NEGATIVE Final    Comment: (NOTE) SARS-CoV-2 target nucleic acids are NOT DETECTED.  The SARS-CoV-2 RNA is generally detectable in upper respiratory specimens during the acute phase of infection. The lowest concentration of SARS-CoV-2 viral copies this assay can detect is 138 copies/mL. A negative result does not preclude SARS-Cov-2 infection and should not be used as the sole basis for treatment or other patient management decisions. A negative result may occur with  improper specimen collection/handling, submission of specimen other than nasopharyngeal swab, presence of viral mutation(s) within the areas targeted by this assay, and inadequate number of viral copies(<138 copies/mL). A negative result must be combined  with clinical observations, patient history, and epidemiological information. The expected result is Negative.  Fact Sheet for Patients:  BloggerCourse.com  Fact Sheet for Healthcare Providers:  SeriousBroker.it  This test is no t yet approved or cleared by the [place] FDA and  has been authorized for detection and/or diagnosis of SARS-CoV-2 by FDA under an Emergency Use Authorization (EUA). This EUA will remain  in effect (meaning this test can be used) for the duration of the COVID-19 declaration under Section 564(b)(1) of the Act, 21 U.S.C.section 360bbb-3(b)(1), unless the authorization is terminated  or revoked sooner.       Influenza A by PCR NEGATIVE NEGATIVE Final   Influenza B by PCR NEGATIVE NEGATIVE  Final    Comment: (NOTE) The Xpert Xpress SARS-CoV-2/FLU/RSV plus assay is intended as an aid in the diagnosis of influenza from Nasopharyngeal swab specimens and should not be used as a sole basis for treatment. Nasal washings and aspirates are unacceptable for Xpert Xpress SARS-CoV-2/FLU/RSV testing.  Fact Sheet for Patients: BloggerCourse.com  Fact Sheet for Healthcare Providers: SeriousBroker.it  This test is not yet approved or cleared by the [place] FDA and has been authorized for detection and/or diagnosis of SARS-CoV-2 by FDA under an Emergency Use Authorization (EUA). This EUA will remain in effect (meaning this test can be used) for the duration of the COVID-19 declaration under Section 564(b)(1) of the Act, 21 U.S.C. section 360bbb-3(b)(1), unless the authorization is terminated or revoked.  Performed at [facility], [address] [place], [place] [address]     Procedures/Studies: CT Renal Stone Study  Result Date: 10/23/2020 CLINICAL DATA:  Left lower quadrant pain for several days, history of renal calculi EXAM: CT ABDOMEN AND  PELVIS WITHOUT CONTRAST TECHNIQUE: Multidetector CT imaging of the abdomen and pelvis was performed following the standard protocol without IV contrast. COMPARISON:  09/21/2018 FINDINGS: Lower chest: No acute pleural or parenchymal lung disease. Hepatobiliary: 5 cm cyst superior aspect right lobe liver. 5.5 cm hypodense mass inferior aspect right lobe liver previously shown to represent hemangioma. There are other scattered subcentimeter hypodensities within the right lobe liver that are stable and presumed to reflect small cysts. No intrahepatic duct dilation. The gallbladder is unremarkable. Pancreas: Unremarkable. No pancreatic ductal dilatation or surrounding inflammatory changes. Spleen: Normal in size without focal abnormality. Adrenals/Urinary Tract: Right renal cyst again noted. No urinary tract calculi or obstructive uropathy. Bladder is decompressed, which limits its evaluation. The adrenals are normal. Stomach/Bowel: No bowel obstruction or ileus. There is a short segment of wall thickening at the junction of the descending and sigmoid colon, with mild pericolonic fat stranding consistent with focal colitis or acute uncomplicated diverticulitis. No perforation, fluid collection, or abscess. Normal appendix right lower quadrant. Vascular/Lymphatic: Aortic atherosclerosis. No enlarged abdominal or pelvic lymph nodes. Reproductive: Uterus and bilateral adnexa are unremarkable. Other: No free fluid or free gas. Postsurgical changes from previous bilateral inguinal hernia repairs. Musculoskeletal: No acute or destructive bony lesions. Reconstructed images demonstrate no additional findings. IMPRESSION: 1. Short segment colitis versus acute uncomplicated diverticulitis at the junction of the descending and sigmoid colon. No perforation, fluid collection, or abscess. 2. No urinary tract calculi or obstructive uropathy. 3. Stable hepatic cysts and right lobe liver hemangioma. 4.  Aortic Atherosclerosis  (ICD10-I70.0). Electronically Signed   By: Sharlet Salina M.D.   On: 10/23/2020 17:40    Labs: BNP (last 3 results) No results for input(s): BNP in the last 8760 hours. Basic Metabolic Panel: Recent Labs  Lab 10/23/20 1325 10/23/20 2313 10/24/20 0520 10/25/20 0608 10/29/20 0959  NA 139  --  139 139 139  K 4.1  --  4.6 4.0 4.2  CL 104  --  104 107 102  CO2 26  --  27 27 30   GLUCOSE 95  --  86 112* 108*  BUN 11  --  10 8 9   CREATININE 0.87 0.86 0.89 0.87 0.92  CALCIUM 9.2  --  8.5* 8.4* 9.3   Liver Function Tests: Recent Labs  Lab 10/23/20 1325 10/25/20 0608  AST 17 19  ALT 14 12  ALKPHOS 59 47  BILITOT 0.7 0.6  PROT 6.9 5.8*  ALBUMIN 3.9 3.2*   Recent Labs  Lab 10/23/20 1325  LIPASE 22   No results for input(s): AMMONIA in the last 168 hours. CBC: Recent Labs  Lab 10/23/20 1325 10/23/20 2313 10/24/20 0520 10/25/20 0608 10/29/20 0959  WBC 9.9 8.3 6.9 5.2 6.0  NEUTROABS  --  5.1  --   --   --   HGB 12.8 12.4 11.5* 11.3* 13.0  HCT 39.0 38.5 35.9* 35.2* 41.5  MCV 81.8 82.6 83.1 82.6 83.7  PLT 288 259 242 204 267   Cardiac Enzymes: No results for input(s): CKTOTAL, CKMB, CKMBINDEX, TROPONINI in the last 168 hours. BNP: Invalid input(s): POCBNP CBG: No results for input(s): GLUCAP in the last 168 hours. D-Dimer No results for input(s): DDIMER in the last 72 hours. Hgb A1c No results for input(s): HGBA1C in the last 72 hours. Lipid Profile No results for input(s): CHOL, HDL, LDLCALC, TRIG, CHOLHDL, LDLDIRECT in the last 72 hours. Thyroid function studies No results for input(s): TSH, T4TOTAL, T3FREE, THYROIDAB in the last 72 hours.  Invalid input(s): FREET3 Anemia work up No results for input(s): VITAMINB12, FOLATE, FERRITIN, TIBC, IRON, RETICCTPCT in the last 72 hours. Urinalysis    Component Value Date/Time   COLORURINE YELLOW (A) 10/23/2020 1700   APPEARANCEUR CLEAR (A) 10/23/2020 1700   APPEARANCEUR Clear 11/14/2018 1034   LABSPEC 1.015  10/23/2020 1700   PHURINE 5.0 10/23/2020 1700   GLUCOSEU NEGATIVE 10/23/2020 1700   HGBUR NEGATIVE 10/23/2020 1700   BILIRUBINUR NEGATIVE 10/23/2020 1700   BILIRUBINUR Negative 10/23/2020 1216   BILIRUBINUR Negative 11/14/2018 1034   KETONESUR NEGATIVE 10/23/2020 1700   PROTEINUR NEGATIVE 10/23/2020 1700   PROTEINUR Positive (A) 10/23/2020 1216   UROBILINOGEN 0.2 10/23/2020 1216   NITRITE NEGATIVE 10/23/2020 1700   NITRITE Negative 10/23/2020 1216   LEUKOCYTESUR TRACE (A) 10/23/2020 1700   LEUKOCYTESUR Trace (A) 10/23/2020 1216   Sepsis Labs Invalid input(s): PROCALCITONIN,  WBC,  LACTICIDVEN Microbiology Recent Results (from the past 240 hour(s))  Resp Panel by RT-PCR (Flu A&B, Covid) Nasopharyngeal Swab     Status: None   Collection Time: 10/23/20  5:00 PM   Specimen: Nasopharyngeal Swab; Nasopharyngeal(NP) swabs in vial transport medium  Result Value Ref Range Status   SARS Coronavirus 2 by RT PCR NEGATIVE NEGATIVE Final    Comment: (NOTE) SARS-CoV-2 target nucleic acids are NOT DETECTED.  The SARS-CoV-2 RNA is generally detectable in upper respiratory specimens during the acute phase of infection. The lowest concentration of SARS-CoV-2 viral copies this assay can detect is 138 copies/mL. A negative result does not preclude SARS-Cov-2 infection and should not be used as the sole basis for treatment or other patient management decisions. A negative result may occur with  improper specimen collection/handling, submission of specimen other than nasopharyngeal swab, presence of viral mutation(s) within the areas targeted by this assay, and inadequate number of viral copies(<138 copies/mL). A negative result must be combined with clinical observations, patient history, and epidemiological information. The expected result is Negative.  Fact Sheet for Patients:  EntrepreneurPulse.com.au  Fact Sheet for Healthcare Providers:   IncredibleEmployment.be  This test is no t yet approved or cleared by the [place] FDA and  has been authorized for detection and/or diagnosis of SARS-CoV-2 by FDA under an Emergency Use Authorization (EUA). This EUA will remain  in effect (meaning this test can be used) for the duration of the COVID-19 declaration under Section 564(b)(1) of the Act, 21 U.S.C.section 360bbb-3(b)(1), unless the authorization is terminated  or revoked sooner.  Influenza A by PCR NEGATIVE NEGATIVE Final   Influenza B by PCR NEGATIVE NEGATIVE Final    Comment: (NOTE) The Xpert Xpress SARS-CoV-2/FLU/RSV plus assay is intended as an aid in the diagnosis of influenza from Nasopharyngeal swab specimens and should not be used as a sole basis for treatment. Nasal washings and aspirates are unacceptable for Xpert Xpress SARS-CoV-2/FLU/RSV testing.  Fact Sheet for Patients: EntrepreneurPulse.com.au  Fact Sheet for Healthcare Providers: IncredibleEmployment.be  This test is not yet approved or cleared by the [place] FDA and has been authorized for detection and/or diagnosis of SARS-CoV-2 by FDA under an Emergency Use Authorization (EUA). This EUA will remain in effect (meaning this test can be used) for the duration of the COVID-19 declaration under Section 564(b)(1) of the Act, 21 U.S.C. section 360bbb-3(b)(1), unless the authorization is terminated or revoked.  Performed at [facility], [address], [place], [place] [address]      Time coordinating discharge: 25 minutes  SIGNED: Antonieta Pert, MD  [facility] 10/29/2020, 10:56 AM  If 7PM-7AM, please contact night-coverage [email or web address]

## 2020-10-27 NOTE — Care Plan (Signed)
Tolerating diet. No further recs from GI standpoint. Will need to complete therapy for diverticulitis. Will need colonoscopy as an outpatient.  Please call if any concerns or questions.  Merlyn Lot MD, MPH [facility] GI

## 2020-10-28 MED ORDER — BISACODYL 10 MG RE SUPP
10.0000 mg | Freq: Every day | RECTAL | Status: DC | PRN
  Filled 2020-10-28: qty 1

## 2020-10-28 MED ORDER — ONDANSETRON 4 MG PO TBDP
4.0000 mg | ORAL_TABLET | Freq: Three times a day (TID) | ORAL | Status: DC | PRN
  Administered 2020-10-28 – 2020-10-29 (×3): 4 mg via ORAL
  Filled 2020-10-28 (×4): qty 1

## 2020-10-28 MED ORDER — ONDANSETRON HCL 4 MG/2ML IJ SOLN: 4.0000 mg | Freq: Four times a day (QID) | INTRAMUSCULAR | Status: DC | PRN

## 2020-10-28 MED ORDER — METRONIDAZOLE 500 MG PO TABS
500.0000 mg | ORAL_TABLET | Freq: Three times a day (TID) | ORAL | Status: DC
  Administered 2020-10-28 – 2020-10-29 (×4): 500 mg via ORAL
  Filled 2020-10-28 (×5): qty 1

## 2020-10-28 MED ORDER — CIPROFLOXACIN HCL 500 MG PO TABS
500.0000 mg | ORAL_TABLET | Freq: Two times a day (BID) | ORAL | Status: DC
  Administered 2020-10-28 – 2020-10-29 (×3): 500 mg via ORAL
  Filled 2020-10-28 (×3): qty 1

## 2020-10-28 NOTE — Care Management Important Message (Signed)
Important Message  Patient Details  Name: Sharon Collins MRN: [id number] Date of Birth: Mar 26, 1951   Medicare Important Message Given:  Yes     Olegario Messier A Jerolene Kupfer 10/28/2020, 11:03 AM

## 2020-10-28 NOTE — Progress Notes (Signed)
PROGRESS NOTE Sharon Collins  [id number] DOB: 10/17/1951 DOA: 10/23/2020 PCP: [name], NP   Chief Complaint  Patient presents with  . Abdominal Pain  . Flank Pain    Brief Narrative: 70 year old female with history of anxiety/chronic back pain, history of nephrolithiasis several years ago presented to the ED 10/23/20 with 4-day history of pain over left lower quadrant, which is unrelieved with OTC medication without nausea vomiting or diarrhea. ED Course: On arrival, she was afebrile, BP 116/58, pulse 69 O2 sat 98% on room air.  Blood work including CBC, CMP and lipase were all within normal limits.  Urinalysis without pyuria Imaging: CT renal stone study shows short segment colitis versus acute uncomplicated diverticulitis at the junction of the descending and sigmoid colon without perforation fluid collection or abscess. Patient treated with pain medication and given IV antibiotics however due to intractable pain, hospitalist consulted for admission for pain control. 79-year-old female with history of anxiety/chronic back pain, history of nephrolithiasis several years ago presented to the ED 10/23/20 with 4-day history of pain over left lower quadrant, which is unrelieved with OTC medication without nausea vomiting or diarrhea. Patient was treated with IV Cipro and Flagyl while here seen by GI diet was slowly advanced.   Subjective:  C/o nausea, needing IV Zofran and discharge was held yesterday. Some left sided abdomen pain but not " bad" This morning complains of dizziness.  Reports he does not have power at home today.  She would like to make sure she can tolerate oral antibiotics for 1 day before she can go home given her persistent nausea.  Assessment & Plan:  Left lower quadrant abdominal pain 2/2 acute colitis vs diverticulitis vs ischemic colitis ( less likely):  Seen by GI, CT scan of the abdomen reviewed.continue diet soft diet/heart healthy, due to ongoing nausea  antibiotic was changed to IV yesterday, will switch back to oral antibiotic-Cipro and Flagyl to make sure patient can tolerate without any nausea.  Discussed with Dr. Haig Prophet and Dr. Alice Reichert will be rounding on her today.  Nursing to notify GI.  Continue Zofran steroid has Zofran prescription at home.  Continue fiber for bowel movement can try as needed Dulcolax if needed.  Back pain she reports she has had chronic back issues from degenerative disease.continue pain control.    Anxiety and depression/bipolar disorder:  Mood stable. Prediabetes: Continue with diet control and outpatient PCP follow-up. History of asthma respiratory status is stable.  Nutrition: Diet Order            Diet - low sodium heart healthy           DIET SOFT Room service appropriate? Yes; Fluid consistency: Thin  Diet effective now                  Body mass index is 28.08 kg/m.   DVT prophylaxis: enoxaparin (LOVENOX) injection 40 mg Start: 10/23/20 1915 Code Status:   Code Status: Full Code  Family Communication: plan of care discussed with patient at bedside.  Status is: Remains inpatient due to difficulty with nausea, inability to tolerate oral antibiotics  Dispo: The patient is from: Home              Anticipated d/c is to: Home              Anticipated d/c date is: Tomorrow if tolerating oral antibiotics without nausea.              Patient currently  is not medically stable to d/c.  Consultants:see note  Procedures:see note  Culture/Microbiology No results found for: SDES, SPECREQUEST, CULT, REPTSTATUS  Other culture-see note  Medications: Scheduled Meds: . ciprofloxacin  500 mg Oral BID  . enoxaparin (LOVENOX) injection  40 mg Subcutaneous Q24H  . gabapentin  600 mg Oral TID  . lamoTRIgine  150 mg Oral Daily  . lidocaine  1 patch Transdermal Q24H  . metroNIDAZOLE  500 mg Oral Q8H  . psyllium  1 packet Oral Daily  . sertraline  100 mg Oral QHS   Continuous  Infusions:   Antimicrobials: Anti-infectives (From admission, onward)   Start     Dose/Rate Route Frequency Ordered Stop   10/28/20 1000  ciprofloxacin (CIPRO) tablet 500 mg        500 mg Oral 2 times daily 10/28/20 0912     10/28/20 1000  metroNIDAZOLE (FLAGYL) tablet 500 mg        500 mg Oral Every 8 hours 10/28/20 0912     10/27/20 2200  ciprofloxacin (CIPRO) IVPB 400 mg  Status:  Discontinued        400 mg 200 mL/hr over 60 Minutes Intravenous Every 12 hours 10/27/20 1515 10/28/20 0912   10/27/20 1600  metroNIDAZOLE (FLAGYL) IVPB 500 mg  Status:  Discontinued        500 mg 100 mL/hr over 60 Minutes Intravenous Every 8 hours 10/27/20 1515 10/28/20 0912   10/27/20 0000  metroNIDAZOLE (FLAGYL) 500 MG tablet        500 mg Oral Every 8 hours 10/27/20 1017 11/06/20 2359   10/27/20 0000  ciprofloxacin (CIPRO) 500 MG tablet        500 mg Oral 2 times daily 10/27/20 1017 11/06/20 2359   10/26/20 1800  ciprofloxacin (CIPRO) tablet 500 mg  Status:  Discontinued        500 mg Oral 2 times daily 10/26/20 1036 10/27/20 1515   10/24/20 0700  ciprofloxacin (CIPRO) IVPB 400 mg  Status:  Discontinued        400 mg 200 mL/hr over 60 Minutes Intravenous Every 12 hours 10/23/20 1900 10/26/20 1036   10/24/20 0400  metroNIDAZOLE (FLAGYL) tablet 500 mg  Status:  Discontinued        500 mg Oral Every 8 hours 10/23/20 1900 10/27/20 1515   10/23/20 1845  ciprofloxacin (CIPRO) IVPB 400 mg        400 mg 200 mL/hr over 60 Minutes Intravenous  Once 10/23/20 1838 10/23/20 2038   10/23/20 1845  metroNIDAZOLE (FLAGYL) IVPB 500 mg        500 mg 100 mL/hr over 60 Minutes Intravenous  Once 10/23/20 1838 10/23/20 2137     Objective: Vitals: Today's Vitals   10/28/20 0411 10/28/20 0817 10/28/20 1107 10/28/20 1200  BP: (!) 95/38 (!) 118/56  109/62  Pulse: (!) 54 (!) 54  (!) 58  Resp: 19 16  18   Temp: 98.2 F (36.8 C) 98.3 F (36.8 C)  99 F (37.2 C)  TempSrc:  Oral  Oral  SpO2: 95% 100%  97%  Weight:       Height:      PainSc:   5      Intake/Output Summary (Last 24 hours) at 10/28/2020 1503 Last data filed at 10/28/2020 1044 Gross per 24 hour  Intake 640 ml  Output -  Net 640 ml   Filed Weights   10/23/20 1322  Weight: 71.9 kg   Weight change:   Intake/Output from previous  day: 01/02 0701 - 01/03 0700 In: 400 [IV Piggyback:400] Out: -  Intake/Output this shift: Total I/O In: 240 [P.O.:240] Out: -   Examination: General exam: AAOx3 , NAD, weak appearing. HEENT:Oral mucosa moist, Ear/Nose WNL grossly, dentition normal. Respiratory system: bilaterally clear,no wheezing or crackles,no use of accessory muscle Cardiovascular system: S1 & S2 +, No JVD,. Gastrointestinal system: Abdomen soft, mildly tender left abdomen,ND, BS+ Nervous System:Alert, awake, moving extremities and grossly nonfocal Extremities: No edema, distal peripheral pulses palpable.  Skin: No rashes,no icterus. MSK: Normal muscle bulk,tone.  Data Reviewed: I have personally reviewed following labs and imaging studies CBC: Recent Labs  Lab 10/23/20 1325 10/23/20 2313 10/24/20 0520 10/25/20 0608  WBC 9.9 8.3 6.9 5.2  NEUTROABS  --  5.1  --   --   HGB 12.8 12.4 11.5* 11.3*  HCT 39.0 38.5 35.9* 35.2*  MCV 81.8 82.6 83.1 82.6  PLT 288 259 242 0000000   Basic Metabolic Panel: Recent Labs  Lab 10/23/20 1325 10/23/20 2313 10/24/20 0520 10/25/20 0608  NA 139  --  139 139  K 4.1  --  4.6 4.0  CL 104  --  104 107  CO2 26  --  27 27  GLUCOSE 95  --  86 112*  BUN 11  --  10 8  CREATININE 0.87 0.86 0.89 0.87  CALCIUM 9.2  --  8.5* 8.4*   GFR: Estimated Creatinine Clearance: 58 mL/min (by C-G formula based on SCr of 0.87 mg/dL). Liver Function Tests: Recent Labs  Lab 10/23/20 1325 10/25/20 0608  AST 17 19  ALT 14 12  ALKPHOS 59 47  BILITOT 0.7 0.6  PROT 6.9 5.8*  ALBUMIN 3.9 3.2*   Comments Recent Labs  Lab 10/23/20 1325  LIPASE 22   No results for input(s): AMMONIA in the last 168  hours. Coagulation Profile: No results for input(s): INR, PROTIME in the last 168 hours. Cardiac Enzymes: No results for input(s): CKTOTAL, CKMB, CKMBINDEX, TROPONINI in the last 168 hours. BNP (last 3 results) No results for input(s): PROBNP in the last 8760 hours. HbA1C: No results for input(s): HGBA1C in the last 72 hours. CBG: No results for input(s): GLUCAP in the last 168 hours. Lipid Profile: No results for input(s): CHOL, HDL, LDLCALC, TRIG, CHOLHDL, LDLDIRECT in the last 72 hours. Thyroid Function Tests: No results for input(s): TSH, T4TOTAL, FREET4, T3FREE, THYROIDAB in the last 72 hours. Anemia Panel: No results for input(s): VITAMINB12, FOLATE, FERRITIN, TIBC, IRON, RETICCTPCT in the last 72 hours. Sepsis Labs: No results for input(s): PROCALCITON, LATICACIDVEN in the last 168 hours.  Recent Results (from the past 240 hour(s))  Resp Panel by RT-PCR (Flu A&B, Covid) Nasopharyngeal Swab     Status: None   Collection Time: 10/23/20  5:00 PM   Specimen: Nasopharyngeal Swab; Nasopharyngeal(NP) swabs in vial transport medium  Result Value Ref Range Status   SARS Coronavirus 2 by RT PCR NEGATIVE NEGATIVE Final    Comment: (NOTE) SARS-CoV-2 target nucleic acids are NOT DETECTED.  The SARS-CoV-2 RNA is generally detectable in upper respiratory specimens during the acute phase of infection. The lowest concentration of SARS-CoV-2 viral copies this assay can detect is 138 copies/mL. A negative result does not preclude SARS-Cov-2 infection and should not be used as the sole basis for treatment or other patient management decisions. A negative result may occur with  improper specimen collection/handling, submission of specimen other than nasopharyngeal swab, presence of viral mutation(s) within the areas targeted by this assay, and  inadequate number of viral copies(<138 copies/mL). A negative result must be combined with clinical observations, patient history, and  epidemiological information. The expected result is Negative.  Fact Sheet for Patients:  BloggerCourse.com  Fact Sheet for Healthcare Providers:  SeriousBroker.it  This test is no t yet approved or cleared by the [place] FDA and  has been authorized for detection and/or diagnosis of SARS-CoV-2 by FDA under an Emergency Use Authorization (EUA). This EUA will remain  in effect (meaning this test can be used) for the duration of the COVID-19 declaration under Section 564(b)(1) of the Act, 21 U.S.C.section 360bbb-3(b)(1), unless the authorization is terminated  or revoked sooner.       Influenza A by PCR NEGATIVE NEGATIVE Final   Influenza B by PCR NEGATIVE NEGATIVE Final    Comment: (NOTE) The Xpert Xpress SARS-CoV-2/FLU/RSV plus assay is intended as an aid in the diagnosis of influenza from Nasopharyngeal swab specimens and should not be used as a sole basis for treatment. Nasal washings and aspirates are unacceptable for Xpert Xpress SARS-CoV-2/FLU/RSV testing.  Fact Sheet for Patients: BloggerCourse.com  Fact Sheet for Healthcare Providers: SeriousBroker.it  This test is not yet approved or cleared by the [place] FDA and has been authorized for detection and/or diagnosis of SARS-CoV-2 by FDA under an Emergency Use Authorization (EUA). This EUA will remain in effect (meaning this test can be used) for the duration of the COVID-19 declaration under Section 564(b)(1) of the Act, 21 U.S.C. section 360bbb-3(b)(1), unless the authorization is terminated or revoked.  Performed at [facility], [address] [place], [place] [address]      Radiology Studies: No results found.  LOS: 4 days   Lanae Boast, MD [facility]  10/28/2020, 3:03 PM

## 2020-10-29 ENCOUNTER — Telehealth: Payer: Self-pay

## 2020-10-29 LAB — CBC
HCT: 41.5 % (ref 36.0–46.0)
Hemoglobin: 13 g/dL (ref 12.0–15.0)
MCH: 26.2 pg (ref 26.0–34.0)
MCHC: 31.3 g/dL (ref 30.0–36.0)
MCV: 83.7 fL (ref 80.0–100.0)
Platelets: 267 10*3/uL (ref 150–400)
RBC: 4.96 MIL/uL (ref 3.87–5.11)
RDW: 14.4 % (ref 11.5–15.5)
WBC: 6 10*3/uL (ref 4.0–10.5)
nRBC: 0 % (ref 0.0–0.2)

## 2020-10-29 LAB — BASIC METABOLIC PANEL
Anion gap: 7 (ref 5–15)
BUN: 9 mg/dL (ref 8–23)
CO2: 30 mmol/L (ref 22–32)
Calcium: 9.3 mg/dL (ref 8.9–10.3)
Chloride: 102 mmol/L (ref 98–111)
Creatinine, Ser: 0.92 mg/dL (ref 0.44–1.00)
GFR, Estimated: >60 mL/min (ref >60)
Glucose, Bld: 108 mg/dL — ABNORMAL HIGH (ref 70–99)
Potassium: 4.2 mmol/L (ref 3.5–5.1)
Sodium: 139 mmol/L (ref 135–145)

## 2020-10-29 MED ORDER — BISACODYL 10 MG RE SUPP: 10.0000 mg | Freq: Every day | RECTAL | 0 refills | Status: DC | PRN

## 2020-10-29 MED ORDER — MICONAZOLE NITRATE 2 % VA CREA: 1.0000 | TOPICAL_CREAM | Freq: Every day | VAGINAL | 0 refills | Status: DC

## 2020-10-29 NOTE — Telephone Encounter (Signed)
Transition Care Management Unsuccessful Follow-up Telephone Call  Date of discharge and from where:  10/29/2020, [facility]  Attempts:  2nd Attempt  Reason for unsuccessful TCM follow-up call:  No answer/busy

## 2020-10-29 NOTE — Telephone Encounter (Signed)
Transition Care Management Unsuccessful Follow-up Telephone Call  Date of discharge and from where:  10/29/2020, [facility]  Attempts:  1st Attempt  Reason for unsuccessful TCM follow-up call:  Left voice message

## 2020-10-29 NOTE — Progress Notes (Signed)
Discharge note:  [facility] Nurse reviewed discharge instructions with pt.  Pt d/c with all belongings. Staff wheeled pt out. Pt transported to home via private vehicle.

## 2020-10-29 NOTE — Plan of Care (Signed)

## 2020-10-30 NOTE — Telephone Encounter (Signed)
Transition Care Management Follow-up Telephone Call  Date of discharge and from where: 10/29/2020, [facility]  How have you been since you were released from the hospital? Patient states that she is feeling much better. Still some fatigue noted.   Any questions or concerns? No  Items Reviewed:  Did the pt receive and understand the discharge instructions provided? Yes   Medications obtained and verified? Yes   Other? No   Any new allergies since your discharge? No   Dietary orders reviewed? Yes  Do you have support at home? Yes   Home Care and Equipment/Supplies: Were home health services ordered? not applicable If so, what is the name of the agency? N/A  Has the agency set up a time to come to the patient's home? not applicable Were any new equipment or medical supplies ordered?  No What is the name of the medical supply agency? N/A Were you able to get the supplies/equipment? not applicable Do you have any questions related to the use of the equipment or supplies? No  Functional Questionnaire: (I = Independent and D = Dependent) ADLs: I  Bathing/Dressing- I  Meal Prep- I  Eating- I  Maintaining continence- I  Transferring/Ambulation- I  Managing Meds- I  Follow up appointments reviewed:   PCP Hospital f/u appt confirmed? Yes  Scheduled to see Vernona Rieger, NP on 11/05/2020 @ 10 am.  Specialist Hospital f/u appt confirmed? Yes  Scheduled to see gastroenterology on   Are transportation arrangements needed? No   If their condition worsens, is the pt aware to call PCP or go to the Emergency Dept.? Yes  Was the patient provided with contact information for the PCP's office or ED? Yes  Was to pt encouraged to call back with questions or concerns? Yes

## 2020-11-05 ENCOUNTER — Encounter: Payer: Self-pay | Admitting: Primary Care

## 2020-11-05 ENCOUNTER — Other Ambulatory Visit (INDEPENDENT_AMBULATORY_CARE_PROVIDER_SITE_OTHER): Payer: PPO

## 2020-11-05 ENCOUNTER — Telehealth (INDEPENDENT_AMBULATORY_CARE_PROVIDER_SITE_OTHER): Payer: PPO | Admitting: Primary Care

## 2020-11-05 VITALS — Ht 63.0 in | Wt 158.0 lb

## 2020-11-05 DIAGNOSIS — R059 Cough, unspecified: Secondary | ICD-10-CM

## 2020-11-05 DIAGNOSIS — K529 Noninfective gastroenteritis and colitis, unspecified: Secondary | ICD-10-CM | POA: Diagnosis not present

## 2020-11-05 DIAGNOSIS — R051 Acute cough: Secondary | ICD-10-CM | POA: Insufficient documentation

## 2020-11-05 NOTE — Assessment & Plan Note (Addendum)
Recent hospital admission for colitis versus uncomplicated diverticulitis.  Today she appears stable, is compliant to outpatient oral antibiotic regimen, continue ciprofloxacin and metronidazole.  Discussed to advance diet slowly, continue to work on hydration.  It does seem as though her diarrhea is chronic, but I am concerned about her darker stools.  We discussed to notify me if her stools remain dark after completion of antibiotics.  She will see GI in March for colonoscopy and follow-up.  Strict return precautions provided.  Hospital notes, labs, imaging reviewed.

## 2020-11-05 NOTE — Progress Notes (Signed)
Subjective:    Patient ID: Sharon Collins, female    DOB: 11-02-1950, 70 y.o.   MRN: [id number]  HPI  Virtual Visit via Video Note  I connected with Sharon Collins on 11/05/20 at 10:00 AM EST by a video enabled telemedicine application and verified that I am speaking with the correct person using two identifiers.  Location: Patient: Home Provider: Office Participants: Patient and myself   I discussed the limitations of evaluation and management by telemedicine and the availability of in person appointments. The patient expressed understanding and agreed to proceed.  History of Present Illness:  Sharon Collins is a 70 year old female with a history of allergic rhinitis, colitis, chronic back pain, prediabetes, urinary incontinence who presents today with a chief complaint of cough and also for transitions of care hospital follow up.  She also reports body aches, headaches, mild cough. Symptoms began two days ago. Her mother in law had "some sort of virus" a few days prior so her doctor called in a "Z-Pack" and she's better.  She has received three Covid-19 vaccines. She denies nausea, vomiting.   Recent admission for colitis vs acute uncomplicated diverticulitis. Admitted on 10/23/20 and discharged home on 10/29/20. CT abdomen pelvis showed short segment sigmoid colitis vs uncomplicated diverticulitis. She was treated with IV Cipro and Flagyl as well as IV fluids. GI consulted, Dr. Alice Reichert, who recommended outpatient colonoscopy. Fiber was added. She was discharged home on oral Cipro and Flagyl.   Since her hospital stay she's feeling much better. She endorses chronic diarrhea, is having diarrhea at home, is eating and drinking well. She has noticed dark stools since coming home from the hospital She is compliant to her ciprofloxacin and metronidazole regimen. She has an appointment with GI for a colonoscopy in March 2022.    Observations/Objective:  Alert and oriented. Appears well,  not sickly. No distress. Speaking in complete sentences. No cough during visit,  Assessment and Plan:  See problem based charting.  Follow Up Instructions:  Please come by today for your COVID-19 test.  Continue taking ciprofloxacin and metronidazole for your stomach infection.  Advance your diet slowly with solid food, be sure to hydrate well with water.  Follow-up with the GI doctor as scheduled.  Please notify me if your stools remain dark after completion of the antibiotics.  It was a pleasure to see you today! Sharon Bossier, NP-C    I discussed the assessment and treatment plan with the patient. The patient was provided an opportunity to ask questions and all were answered. The patient agreed with the plan and demonstrated an understanding of the instructions.   The patient was advised to call back or seek an in-person evaluation if the symptoms worsen or if the condition fails to improve as anticipated.   Sharon Koch, NP     Review of Systems  Constitutional: Positive for fatigue. Negative for fever.  Respiratory: Positive for cough.   Gastrointestinal: Positive for diarrhea. Negative for abdominal pain, nausea and vomiting.  Musculoskeletal: Positive for myalgias.  Neurological: Positive for headaches.       Past Medical History:  Diagnosis Date  . Acute medial meniscus tear    left  . Allergic rhinitis   . Anxiety   . Anxiety and depression   . Asthma   . Atrophic vaginitis   . Cataracts, bilateral   . Cervical radiculopathy   . Degenerative disc disease, lumbar   . Insomnia   . Renal disorder  kidney stones  . Urinary incontinence      Social History   Socioeconomic History  . Marital status: Married    Spouse name: Not on file  . Number of children: Not on file  . Years of education: Not on file  . Highest education level: Not on file  Occupational History  . Not on file  Tobacco Use  . Smoking status: Never Smoker  . Smokeless  tobacco: Never Used  Vaping Use  . Vaping Use: Never used  Substance and Sexual Activity  . Alcohol use: No  . Drug use: Yes    Comment: prescribed oxy through pain clinic for back  . Sexual activity: Not on file  Other Topics Concern  . Not on file  Social History Narrative   Married.   3 children, 11 grandchildren, 1 great grand child.   Retired. Worked at [employer], [place], [place], [place].   Enjoys spending time with her family.   Social Determinants of Health   Financial Resource Strain: Not on file  Food Insecurity: Not on file  Transportation Needs: Not on file  Physical Activity: Not on file  Stress: Not on file  Social Connections: Not on file  Intimate Partner Violence: Not on file    Past Surgical History:  Procedure Laterality Date  . BLADDER SUSPENSION    . BUNIONECTOMY    . CATARACT EXTRACTION Right 08/2017  . HAMMER TOE SURGERY    . LAMINECTOMY    . MANDIBLE FRACTURE SURGERY    . MANDIBLE FRACTURE SURGERY    . PARTIAL HYSTERECTOMY      Family History  Problem Relation Age of Onset  . Stroke Mother   . Hypertension Mother   . Thyroid disease Mother   . Cancer Father        Brain Tumor  . Hypertension Sister   . Thyroid disease Sister   . Hypertension Brother   . Mental illness Brother   . Thyroid disease Brother   . COPD Brother   . Heart disease Brother   . Cancer Maternal Aunt   . Cancer Maternal Grandmother   . Diabetes Neg Hx     Allergies  Allergen Reactions  . Codeine   . Hydrocodone Nausea And Vomiting    Vicodin causes vomiting, but when takes Phenergan 0.25 mg with it, she can tolerate it.  . Tramadol Nausea And Vomiting    Causes vomiting & affects her heart rhythm.    Current Outpatient Medications on File Prior to Visit  Medication Sig Dispense Refill  . albuterol (VENTOLIN HFA) 108 (90 Base) MCG/ACT inhaler Inhale 2 puffs into the lungs every 6 (six) hours as needed for wheezing or shortness of breath. 8 g 0  .  azelastine (ASTELIN) 0.1 % nasal spray Place 1 spray into both nostrils 2 (two) times daily as needed for rhinitis. 30 mL 5  . bisacodyl (DULCOLAX) 10 MG suppository Place 1 suppository (10 mg total) rectally daily as needed for moderate constipation. 12 suppository 0  . ciprofloxacin (CIPRO) 500 MG tablet Take 1 tablet (500 mg total) by mouth 2 (two) times daily for 10 days. 20 tablet 0  . desloratadine (CLARINEX) 5 MG tablet Take 5 mg by mouth daily as needed (allergies).    Marland Kitchen etodolac (LODINE) 500 MG tablet TAKE 1 TABLET BY MOUTH ONCE DAILY AS NEEDED FOR PAIN (Patient taking differently: Take 500 mg by mouth daily as needed (pain).) 30 tablet 0  . gabapentin (NEURONTIN) 300 MG  capsule Take 600 mg by mouth 3 (three) times daily.     . hydrOXYzine (ATARAX/VISTARIL) 25 MG tablet Take 25 mg by mouth daily as needed for anxiety (sleep).    Marland Kitchen lamoTRIgine (LAMICTAL) 150 MG tablet Take 150 mg by mouth at bedtime.    . metroNIDAZOLE (FLAGYL) 500 MG tablet Take 1 tablet (500 mg total) by mouth every 8 (eight) hours for 10 days. 30 tablet 0  . miconazole (MONISTAT 7) 2 % vaginal cream Place 1 Applicatorful vaginally at bedtime. 45 g 0  . montelukast (SINGULAIR) 10 MG tablet Take 1 tablet (10 mg total) by mouth at bedtime. For allergies. (Patient taking differently: Take 10 mg by mouth at bedtime as needed (allergies).) 90 tablet 3  . ondansetron (ZOFRAN-ODT) 4 MG disintegrating tablet Take 1 tablet (4 mg total) by mouth every 8 (eight) hours as needed for nausea or vomiting. 90 tablet 0  . oxyCODONE-acetaminophen (PERCOCET/ROXICET) 5-325 MG tablet Take 1 tablet by mouth 2 (two) times daily as needed for moderate pain.    Marland Kitchen psyllium (HYDROCIL/METAMUCIL) 95 % PACK Take 1 packet by mouth daily for 14 days. 14 packet 0  . QUEtiapine (SEROQUEL) 100 MG tablet Take 100 mg by mouth at bedtime as needed (sleep).    . sertraline (ZOLOFT) 100 MG tablet Take 100 mg by mouth at bedtime.    . clindamycin-benzoyl peroxide  (BENZACLIN) gel Apply topically 2 (two) times daily. (Patient not taking: No sig reported) 35 g 2   No current facility-administered medications on file prior to visit.    Ht 5\' 3"  (1.6 m)   Wt 158 lb (71.7 kg)   LMP  (LMP Unknown)   BMI 27.99 kg/m    Objective:   Physical Exam Constitutional:      General: She is not in acute distress.    Appearance: She is not ill-appearing.  Pulmonary:     Effort: Pulmonary effort is normal.     Comments: No cough during visit Neurological:     Mental Status: She is alert.  Psychiatric:        Mood and Affect: Mood normal.            Assessment & Plan:

## 2020-11-05 NOTE — Assessment & Plan Note (Signed)
Acute for the last few days, recent hospitalization.  She has had all COVID vaccines. Given recent hospitalization, we will test her for COVID-19 to rule out infection.  She appears stable, not sickly. Discussed conservative treatment. Await results.

## 2020-11-06 LAB — CBC WITH DIFFERENTIAL/PLATELET
Basophils Absolute: 0 10*3/uL (ref 0.0–0.1)
Basophils Relative: 0.8 % (ref 0.0–3.0)
Eosinophils Absolute: 0 10*3/uL (ref 0.0–0.7)
Eosinophils Relative: 0.7 % (ref 0.0–5.0)
HCT: 38.8 % (ref 36.0–46.0)
Hemoglobin: 12.8 g/dL (ref 12.0–15.0)
Lymphocytes Relative: 17.4 % (ref 12.0–46.0)
Lymphs Abs: 0.9 10*3/uL (ref 0.7–4.0)
MCHC: 32.9 g/dL (ref 30.0–36.0)
MCV: 80.3 fl (ref 78.0–100.0)
Monocytes Absolute: 0.9 10*3/uL (ref 0.1–1.0)
Monocytes Relative: 16.7 % — ABNORMAL HIGH (ref 3.0–12.0)
Neutro Abs: 3.5 10*3/uL (ref 1.4–7.7)
Neutrophils Relative %: 64.4 % (ref 43.0–77.0)
Platelets: 308 10*3/uL (ref 150.0–400.0)
RBC: 4.84 Mil/uL (ref 3.87–5.11)
RDW: 15.4 % (ref 11.5–15.5)
WBC: 5.4 10*3/uL (ref 4.0–10.5)

## 2020-11-06 LAB — BASIC METABOLIC PANEL
BUN: 7 mg/dL (ref 6–23)
CO2: 27 mEq/L (ref 19–32)
Calcium: 8.8 mg/dL (ref 8.4–10.5)
Chloride: 101 mEq/L (ref 96–112)
Creatinine, Ser: 0.93 mg/dL (ref 0.40–1.20)
GFR: 62.86 mL/min (ref >60.00)
Glucose, Bld: 106 mg/dL — ABNORMAL HIGH (ref 70–99)
Potassium: 4.3 mEq/L (ref 3.5–5.1)
Sodium: 135 mEq/L (ref 135–145)

## 2020-11-08 LAB — NOVEL CORONAVIRUS, NAA: SARS-CoV-2, NAA: DETECTED — AB

## 2020-11-12 ENCOUNTER — Telehealth: Payer: Self-pay

## 2020-11-12 NOTE — Telephone Encounter (Signed)
Pt was contacted by CMA on 1/15 and given results for covid test.

## 2020-11-12 NOTE — Telephone Encounter (Signed)
Experiment Night - Client TELEPHONE ADVICE RECORD [facility] Patient Name: Sharon Collins Gender: Female DOB: January 02, 1951 Age: 70 Y 2 M 5 D Return Phone Number: [phone] (Primary), [phone] (Secondary) Address: City/State/Zip: [place] [address] Client [facility] Night - Client Client Site [facility] Physician Alma Friendly - NP Contact Type Call Who Is Calling Patient / Member / Family / Caregiver Call Type Triage / Clinical Relationship To Patient Self Return Phone Number [phone] (Primary) Chief Complaint Lab Result Reason for Call Symptomatic / Request for Health Information Initial Comment Caller states, had a Covid test last Tuesday. Needs results today. Friend needs to know if she has Covid or not. Friend needs to go back to work tomorrow. Translation No Nurse Assessment Nurse: Jimmye Norman, RN, Armen Pickup Date/Time Eilene Ghazi Time): 11/08/2020 6:44:29 PM Confirm and document reason for call. If symptomatic, describe symptoms. ---Caller states she was tested for COVID on Tuesday, result pending. Has a cough, at times achy Does the patient have any new or worsening symptoms? ---Yes Will a triage be completed? ---Yes Related visit to physician within the last 2 weeks? ---Yes Does the PT have any chronic conditions? (i.e. diabetes, asthma, this includes High risk factors for pregnancy, etc.) ---No Is this a behavioral health or substance abuse call? ---No Guidelines Guideline Title Affirmed Question Affirmed Notes Nurse Date/Time (Eastern Time) COVID-19 - Diagnosed or Suspected [1] COVID-19 infection suspected by caller or triager AND [2] mild symptoms (cough, fever, or others) AND [3] has not gotten tested yet Jimmye Norman, Therapist, sports, Armen Pickup 11/08/2020 6:48:08 PM Disp. Time Eilene Ghazi Time) Disposition Final User 11/08/2020 6:56:29 PM Call PCP when Office is Open Yes Jimmye Norman, RN, Armen Pickup Caller Disagree/Comply Comply PLEASE NOTE: All timestamps contained within this report are represented as [place] Standard Time. CONFIDENTIALTY NOTICE: This fax transmission is intended only for the addressee. It contains information that is legally privileged, confidential or otherwise protected from use or disclosure. If you are not the intended recipient, you are strictly prohibited from reviewing, disclosing, copying using or disseminating any of this information or taking any action in reliance on or regarding this information. If you have received this fax in error, please notify us immediately by telephone so that we can arrange for its return to [place]. Phone: [phone], Toll-Free: [phone], Fax: [phone] Page: 2 of 2 Call Id: [id number] Kell Understands Yes PreDisposition Call Doctor Care Advice Given Per Guideline CALL PCP WHEN OFFICE IS OPEN: CALL BACK IF: * Fever over 103 F (39.4 C) * Fever returns after being gone for 24 hours * Fever lasts over 3 days * Chest pain or difficulty breathing occurs * You become worse CARE ADVICE given per COVID-19 - DIAGNOSED OR SUSPECTED (Adult) guideline. Comments User: Armen Pickup, Jimmye Norman, RN Date/Time Eilene Ghazi Time): 11/08/2020 6:54:24 PM Virtual visit on the 11th, states she is feeling better now than when she had the virtual visit

## 2020-12-18 DIAGNOSIS — M961 Postlaminectomy syndrome, not elsewhere classified: Secondary | ICD-10-CM | POA: Diagnosis not present

## 2020-12-18 DIAGNOSIS — M4722 Other spondylosis with radiculopathy, cervical region: Secondary | ICD-10-CM | POA: Diagnosis not present

## 2020-12-18 DIAGNOSIS — G8929 Other chronic pain: Secondary | ICD-10-CM | POA: Diagnosis not present

## 2020-12-18 DIAGNOSIS — M25562 Pain in left knee: Secondary | ICD-10-CM | POA: Diagnosis not present

## 2020-12-18 DIAGNOSIS — M533 Sacrococcygeal disorders, not elsewhere classified: Secondary | ICD-10-CM | POA: Diagnosis not present

## 2020-12-18 DIAGNOSIS — M47816 Spondylosis without myelopathy or radiculopathy, lumbar region: Secondary | ICD-10-CM | POA: Diagnosis not present

## 2020-12-18 DIAGNOSIS — G894 Chronic pain syndrome: Secondary | ICD-10-CM | POA: Diagnosis not present

## 2020-12-19 ENCOUNTER — Ambulatory Visit: Payer: PPO | Admitting: Dermatology

## 2021-01-01 ENCOUNTER — Ambulatory Visit: Payer: PPO | Admitting: Dermatology

## 2021-01-15 ENCOUNTER — Ambulatory Visit: Payer: PPO | Admitting: Dermatology

## 2021-01-15 ENCOUNTER — Other Ambulatory Visit: Payer: Self-pay

## 2021-01-15 DIAGNOSIS — D2371 Other benign neoplasm of skin of right lower limb, including hip: Secondary | ICD-10-CM | POA: Diagnosis not present

## 2021-01-15 DIAGNOSIS — L578 Other skin changes due to chronic exposure to nonionizing radiation: Secondary | ICD-10-CM

## 2021-01-15 DIAGNOSIS — D239 Other benign neoplasm of skin, unspecified: Secondary | ICD-10-CM

## 2021-01-15 DIAGNOSIS — L821 Other seborrheic keratosis: Secondary | ICD-10-CM | POA: Diagnosis not present

## 2021-01-15 DIAGNOSIS — L219 Seborrheic dermatitis, unspecified: Secondary | ICD-10-CM

## 2021-01-15 DIAGNOSIS — L820 Inflamed seborrheic keratosis: Secondary | ICD-10-CM | POA: Diagnosis not present

## 2021-01-15 NOTE — Progress Notes (Signed)
   Follow-Up Visit   Subjective  Sharon Collins is a 70 y.o. female who presents for the following: ISK  (Mid back - recheck for persistent ), irritated lesion (On the left hand - patient would like it treated), and Seborrheic Dermatitis (Of the face - patient uses HC cream PRN flares).  The following portions of the chart were reviewed this encounter and updated as appropriate:   Tobacco  Allergies  Meds  Problems  Med Hx  Surg Hx  Fam Hx     Review of Systems:  No other skin or systemic complaints except as noted in HPI or Assessment and Plan.  Objective  Well appearing patient in no apparent distress; mood and affect are within normal limits.  A focused examination was performed including back . Relevant physical exam findings are noted in the Assessment and Plan.  Objective  L hand near the thumb x 1, R pretibial x 2: Erythematous keratotic or waxy stuck-on papule or plaque.  2.0 cm brown plaque of the L hand dorsum near the thumb.   Objective  Face: Pink patches with greasy scale.   Objective  R pretibial: Dermatofibroma   Assessment & Plan  Inflamed seborrheic keratosis L hand near the thumb x 1, R pretibial x 2  Destruction of lesion - L hand near the thumb x 1, R pretibial x 2 Complexity: simple   Destruction method: cryotherapy   Informed consent: discussed and consent obtained   Timeout:  patient name, date of birth, surgical site, and procedure verified Lesion destroyed using liquid nitrogen: Yes   Region frozen until ice ball extended beyond lesion: Yes   Outcome: patient tolerated procedure well with no complications   Post-procedure details: wound care instructions given    Seborrheic dermatitis Face  Seborrheic Dermatitis  -  is a chronic persistent rash characterized by pinkness and scaling most commonly of the mid face but also can occur on the scalp (dandruff), ears; mid chest and mid back. It tends to be exacerbated by stress and cooler  weather.  People who have neurologic disease may experience new onset or exacerbation of existing seborrheic dermatitis.  The condition is not curable but treatable and can be controlled.  Continue HC OTC cream to aa's 2-3 days per week PRN.    Dermatofibroma R pretibial  Benign-appearing.  Observation.  Call clinic for new or changing lesions.  Recommend daily use of broad spectrum spf 30+ sunscreen to sun-exposed areas.     Seborrheic Keratoses - Stuck-on, waxy, tan-brown papules and plaques  - Discussed benign etiology and prognosis. - Observe - Call for any changes  Actinic Damage - chronic, secondary to cumulative UV radiation exposure/sun exposure over time - diffuse scaly erythematous macules with underlying dyspigmentation - Recommend daily broad spectrum sunscreen SPF 30+ to sun-exposed areas, reapply every 2 hours as needed.  - Recommend staying in the shade or wearing long sleeves, sun glasses (UVA+UVB protection) and wide brim hats (4-inch brim around the entire circumference of the hat). - Call for new or changing lesions.  Return in about 1 year (around 01/15/2022) for TBSE.  Luther Redo, CMA, am acting as scribe for Sarina Ser, MD .  Documentation: I have reviewed the above documentation for accuracy and completeness, and I agree with the above.  Sarina Ser, MD

## 2021-01-15 NOTE — Patient Instructions (Signed)

## 2021-01-16 ENCOUNTER — Encounter: Payer: Self-pay | Admitting: Dermatology

## 2021-02-03 ENCOUNTER — Other Ambulatory Visit: Payer: Self-pay

## 2021-02-03 ENCOUNTER — Ambulatory Visit (INDEPENDENT_AMBULATORY_CARE_PROVIDER_SITE_OTHER)
Admission: RE | Admit: 2021-02-03 | Discharge: 2021-02-03 | Disposition: A | Discharge status: Home / Self Care | Payer: PPO | Source: Ambulatory Visit | Attending: Family Medicine | Admitting: Family Medicine

## 2021-02-03 ENCOUNTER — Encounter: Payer: Self-pay | Admitting: Family Medicine

## 2021-02-03 ENCOUNTER — Ambulatory Visit (INDEPENDENT_AMBULATORY_CARE_PROVIDER_SITE_OTHER): Payer: PPO | Admitting: Family Medicine

## 2021-02-03 VITALS — BP 104/68 | HR 73 | Temp 97.8°F | Ht 63.0 in | Wt 161.2 lb

## 2021-02-03 DIAGNOSIS — M65311 Trigger thumb, right thumb: Secondary | ICD-10-CM

## 2021-02-03 DIAGNOSIS — M25641 Stiffness of right hand, not elsewhere classified: Secondary | ICD-10-CM

## 2021-02-03 DIAGNOSIS — M79644 Pain in right finger(s): Secondary | ICD-10-CM

## 2021-02-03 DIAGNOSIS — S6991XA Unspecified injury of right wrist, hand and finger(s), initial encounter: Secondary | ICD-10-CM | POA: Diagnosis not present

## 2021-02-03 MED ORDER — TRIAMCINOLONE ACETONIDE 40 MG/ML IJ SUSP
20.0000 mg | Freq: Once | INTRAMUSCULAR | Status: AC
  Administered 2021-02-03: 20 mg via INTRA_ARTICULAR

## 2021-02-03 NOTE — Progress Notes (Signed)
[name], MD, [facility] [place] [facility] [place], [address]  Phone: [phone]  FAX: [phone]  Sharon Collins - 70 y.o. female  MRN [id number]  Date of Birth: 1951-01-20  Date: 02/03/2021  PCP: [name], NP  Referral: [name], NP  Chief Complaint  Patient presents with  . Wrist Pain    RIght-Fall around a month ago  . Thumb Pain    Right    This visit occurred during the SARS-CoV-2 public health emergency.  Safety protocols were in place, including screening questions prior to the visit, additional usage of staff PPE, and extensive cleaning of exam room while observing appropriate contact time as indicated for disinfecting solutions.   Subjective:   Sharon Collins is a 70 y.o. very pleasant female patient with Body mass index is 28.56 kg/m. who presents with the following:  Thumb pain s/p fall one month ago.  While she was in bed, she did fall out of the bed and hit the floor.  At that point her elbow on the right was the primary issue, and it had a lot of swelling and bruising.  That is since resolved entirely, but the primary concern now is her right thumb.  She has developed a lot of stiffness in both the IP as well as the MCP joints.  She also has developed a nodule and some triggering on the first digit of the flexor tendon as well.  She also does have some swelling at both the IP and MCP joints.   Review of Systems is noted in the HPI, as appropriate   Objective:   BP 104/68   Pulse 73   Temp 97.8 F (36.6 C) (Temporal)   Ht 5\' 3"  (1.6 m)   Wt 161 lb 4 oz (73.1 kg)   LMP  (LMP Unknown)   SpO2 98%   BMI 28.56 kg/m   GEN: No acute distress; alert,appropriate. PULM: Breathing comfortably in no respiratory distress PSYCH: Normally interactive.    At the first digit and MCP on the right, she has some swelling at the IP as well  as MCP joint.  There is no focal tenderness with medial and lateral stress.  There is no hyperextensibility and flexion and extension are preserved.  She has notable loss of motion in flexion in both joints.  She has an obvious nodule on the flexor tendon just caudal to the A1 pulley and she does have some triggering.  Radiology: X-ray, thumb. Indication: Pain Findings: There is no evidence of acute fracture, dislocation, foreign body.  She does have some degenerative changes. Electronically Signed  By: Owens Loffler, MD On: 02/03/2021  9:40 AM EDT   Assessment and Plan:     ICD-10-CM   1. Decreased range of motion of right thumb  M25.641 Ambulatory referral to Occupational Therapy  2. Thumb pain, right  M79.644 DG Finger Thumb Right    Ambulatory referral to Occupational Therapy    triamcinolone acetonide (KENALOG-40) injection 20 mg  3. Trigger finger of right thumb  M65.311    I primarily worry about her loss of motion and decreased functionality at the thumb.  She is having a difficult time doing fine motor tasks and grasping with the thumb.  Notable loss of motion, she is also developed a trigger finger on the first digit as well.  I am going to inject her trigger  finger today, and we went over some aggressive range of motion protocols, and I am going to have her also see hand therapy.  Tendon Sheath Injection Procedure Note Sharon Collins Jul 09, 1971 Date of procedure: 02/03/2021  Procedure: Tendon Sheath Injection for Trigger Finger, 1st R Indications: Pain  Procedure Details Verbal consent was obtained. Risks (including potential risk for skin lightening and potential atrophy), benefits and alternatives were discussed. Prepped with Chloraprep and Ethyl Chloride used for anesthesia. Under sterile conditions, patient injected at palmar crease aiming distally with 45 degree angle towards nodule; injected directly into tendon sheath. Medication flowed freely without resistance.   Needle size: 22 gauge 1 1/2 inch Injection: 1/2 cc of Lidocaine 1% and Kenalog 20 mg Medication: 1/2 cc of Kenalog 40 mg (equaling Kenalog 20 mg)  Meds ordered this encounter  Medications  . triamcinolone acetonide (KENALOG-40) injection 20 mg   Medications Discontinued During This Encounter  Medication Reason  . gabapentin (NEURONTIN) 300 MG capsule Change in therapy  . montelukast (SINGULAIR) 10 MG tablet Duplicate   Orders Placed This Encounter  Procedures  . DG Finger Thumb Right  . Ambulatory referral to Occupational Therapy    Follow-up: 4 to 6 weeks, if she is fully improved then no reason to follow-up.  Signed,  Maud Deed. Mattye Verdone, MD   Outpatient Encounter Medications as of 02/03/2021  Medication Sig  . albuterol (VENTOLIN HFA) 108 (90 Base) MCG/ACT inhaler Inhale 2 puffs into the lungs every 6 (six) hours as needed for wheezing or shortness of breath.  Marland Kitchen azelastine (ASTELIN) 0.1 % nasal spray Place 1 spray into both nostrils 2 (two) times daily as needed for rhinitis.  Marland Kitchen bisacodyl (DULCOLAX) 10 MG suppository Place 1 suppository (10 mg total) rectally daily as needed for moderate constipation.  . clindamycin-benzoyl peroxide (BENZACLIN) gel Apply topically 2 (two) times daily.  Marland Kitchen desloratadine (CLARINEX) 5 MG tablet Take 5 mg by mouth daily as needed (allergies).  Marland Kitchen etodolac (LODINE) 500 MG tablet TAKE 1 TABLET BY MOUTH ONCE DAILY AS NEEDED FOR PAIN  . gabapentin (NEURONTIN) 600 MG tablet Take 600 mg by mouth 3 (three) times daily.  . hydrOXYzine (ATARAX/VISTARIL) 25 MG tablet Take 25 mg by mouth daily as needed for anxiety (sleep).  Marland Kitchen lamoTRIgine (LAMICTAL) 150 MG tablet Take 150 mg by mouth at bedtime.  . miconazole (MONISTAT 7) 2 % vaginal cream Place 1 Applicatorful vaginally at bedtime.  . montelukast (SINGULAIR) 10 MG tablet Take 10 mg by mouth at bedtime as needed (Seasonal).  . ondansetron (ZOFRAN-ODT) 4 MG disintegrating tablet Take 1 tablet (4 mg total) by  mouth every 8 (eight) hours as needed for nausea or vomiting.  Marland Kitchen oxyCODONE-acetaminophen (PERCOCET/ROXICET) 5-325 MG tablet Take 1 tablet by mouth 2 (two) times daily as needed for moderate pain.  Marland Kitchen QUEtiapine (SEROQUEL) 100 MG tablet Take 100 mg by mouth at bedtime as needed (sleep).  . sertraline (ZOLOFT) 100 MG tablet Take 100 mg by mouth at bedtime.  . [DISCONTINUED] gabapentin (NEURONTIN) 300 MG capsule Take 600 mg by mouth 3 (three) times daily.   . [DISCONTINUED] montelukast (SINGULAIR) 10 MG tablet Take 1 tablet (10 mg total) by mouth at bedtime. For allergies. (Patient taking differently: Take 10 mg by mouth at bedtime as needed (allergies).)  . [EXPIRED] triamcinolone acetonide (KENALOG-40) injection 20 mg    No facility-administered encounter medications on file as of 02/03/2021.

## 2021-02-04 ENCOUNTER — Ambulatory Visit: Payer: PPO | Admitting: Primary Care

## 2021-02-24 ENCOUNTER — Ambulatory Visit: Payer: PPO | Attending: Family Medicine | Admitting: Occupational Therapy

## 2021-03-11 DIAGNOSIS — G894 Chronic pain syndrome: Secondary | ICD-10-CM | POA: Diagnosis not present

## 2021-03-11 DIAGNOSIS — Z6828 Body mass index (BMI) 28.0-28.9, adult: Secondary | ICD-10-CM | POA: Diagnosis not present

## 2021-03-11 DIAGNOSIS — M7918 Myalgia, other site: Secondary | ICD-10-CM | POA: Diagnosis not present

## 2021-03-25 DIAGNOSIS — R159 Full incontinence of feces: Secondary | ICD-10-CM | POA: Diagnosis not present

## 2021-03-25 DIAGNOSIS — K5900 Constipation, unspecified: Secondary | ICD-10-CM | POA: Diagnosis not present

## 2021-03-25 DIAGNOSIS — R933 Abnormal findings on diagnostic imaging of other parts of digestive tract: Secondary | ICD-10-CM | POA: Diagnosis not present

## 2021-03-25 DIAGNOSIS — R198 Other specified symptoms and signs involving the digestive system and abdomen: Secondary | ICD-10-CM | POA: Diagnosis not present

## 2021-05-23 DIAGNOSIS — M1712 Unilateral primary osteoarthritis, left knee: Secondary | ICD-10-CM | POA: Diagnosis not present

## 2021-05-23 DIAGNOSIS — M1711 Unilateral primary osteoarthritis, right knee: Secondary | ICD-10-CM | POA: Diagnosis not present

## 2021-05-23 DIAGNOSIS — M170 Bilateral primary osteoarthritis of knee: Secondary | ICD-10-CM | POA: Diagnosis not present

## 2021-05-30 ENCOUNTER — Ambulatory Visit: Payer: PPO | Admitting: Nurse Practitioner

## 2021-06-03 DIAGNOSIS — K5730 Diverticulosis of large intestine without perforation or abscess without bleeding: Secondary | ICD-10-CM | POA: Diagnosis not present

## 2021-06-03 DIAGNOSIS — R198 Other specified symptoms and signs involving the digestive system and abdomen: Secondary | ICD-10-CM | POA: Diagnosis not present

## 2021-06-03 DIAGNOSIS — K640 First degree hemorrhoids: Secondary | ICD-10-CM | POA: Diagnosis not present

## 2021-06-03 DIAGNOSIS — R933 Abnormal findings on diagnostic imaging of other parts of digestive tract: Secondary | ICD-10-CM | POA: Diagnosis not present

## 2021-06-03 DIAGNOSIS — R194 Change in bowel habit: Secondary | ICD-10-CM | POA: Diagnosis not present

## 2021-06-05 DIAGNOSIS — M4722 Other spondylosis with radiculopathy, cervical region: Secondary | ICD-10-CM | POA: Diagnosis not present

## 2021-06-05 DIAGNOSIS — M47812 Spondylosis without myelopathy or radiculopathy, cervical region: Secondary | ICD-10-CM | POA: Diagnosis not present

## 2021-06-05 DIAGNOSIS — G8929 Other chronic pain: Secondary | ICD-10-CM | POA: Diagnosis not present

## 2021-06-05 DIAGNOSIS — M961 Postlaminectomy syndrome, not elsewhere classified: Secondary | ICD-10-CM | POA: Diagnosis not present

## 2021-06-05 DIAGNOSIS — M533 Sacrococcygeal disorders, not elsewhere classified: Secondary | ICD-10-CM | POA: Diagnosis not present

## 2021-06-05 DIAGNOSIS — G894 Chronic pain syndrome: Secondary | ICD-10-CM | POA: Diagnosis not present

## 2021-06-05 DIAGNOSIS — M47816 Spondylosis without myelopathy or radiculopathy, lumbar region: Secondary | ICD-10-CM | POA: Diagnosis not present

## 2021-06-05 DIAGNOSIS — M25562 Pain in left knee: Secondary | ICD-10-CM | POA: Diagnosis not present

## 2021-07-14 ENCOUNTER — Telehealth: Payer: Self-pay

## 2021-07-14 ENCOUNTER — Ambulatory Visit (INDEPENDENT_AMBULATORY_CARE_PROVIDER_SITE_OTHER): Payer: PPO

## 2021-07-14 DIAGNOSIS — Z0000 Encounter for general adult medical examination without abnormal findings: Secondary | ICD-10-CM | POA: Diagnosis not present

## 2021-07-14 DIAGNOSIS — Z1231 Encounter for screening mammogram for malignant neoplasm of breast: Secondary | ICD-10-CM | POA: Diagnosis not present

## 2021-07-14 NOTE — Progress Notes (Signed)
Subjective:   Sharon Collins is a 70 y.o. female who presents for Medicare Annual (Subsequent) preventive examination.  I connected with  Sharon Collins on 07/14/21 by an audio only telemedicine application and verified that I am speaking with the correct person using two identifiers.   I discussed the limitations, risks, security and privacy concerns of performing an evaluation and management service by telephone and the availability of in person appointments. I also discussed with the patient that there may be a patient responsible charge related to this service. The patient expressed understanding and verbally consented to this telephonic visit.  Location of Patient: [employer]le[employer] work Tree surgeon: Office  Persons present for the visit: Sharon Collins, patient and Sharon Collins, RMA  Review of Systems    Defer to PCP       Objective:    There were no vitals filed for this visit. There is no height or weight on file to calculate BMI.  Advanced Directives 07/14/2021 10/23/2020 10/27/2019 03/28/2019  Does Patient Have a Medical Advance Directive? No No No No  Would patient like information on creating a medical advance directive? Yes (MAU/Ambulatory/Procedural Areas - Information given) No - Patient declined No - Patient declined No - Patient declined    Current Medications (verified) Outpatient Encounter Medications as of 07/14/2021  Medication Sig   albuterol (VENTOLIN HFA) 108 (90 Base) MCG/ACT inhaler Inhale 2 puffs into the lungs every 6 (six) hours as needed for wheezing or shortness of breath.   azelastine (ASTELIN) 0.1 % nasal spray Place 1 spray into both nostrils 2 (two) times daily as needed for rhinitis.   bisacodyl (DULCOLAX) 10 MG suppository Place 1 suppository (10 mg total) rectally daily as needed for moderate constipation.   clindamycin-benzoyl peroxide (BENZACLIN) gel Apply topically 2 (two) times daily.   desloratadine (CLARINEX) 5 MG tablet Take 5  mg by mouth daily as needed (allergies).   etodolac (LODINE) 500 MG tablet TAKE 1 TABLET BY MOUTH ONCE DAILY AS NEEDED FOR PAIN   gabapentin (NEURONTIN) 600 MG tablet Take 600 mg by mouth 3 (three) times daily.   hydrOXYzine (ATARAX/VISTARIL) 25 MG tablet Take 25 mg by mouth daily as needed for anxiety (sleep).   lamoTRIgine (LAMICTAL) 150 MG tablet Take 150 mg by mouth at bedtime.   montelukast (SINGULAIR) 10 MG tablet Take 10 mg by mouth at bedtime as needed (Seasonal).   ondansetron (ZOFRAN-ODT) 4 MG disintegrating tablet Take 1 tablet (4 mg total) by mouth every 8 (eight) hours as needed for nausea or vomiting.   oxyCODONE-acetaminophen (PERCOCET/ROXICET) 5-325 MG tablet Take 1 tablet by mouth 2 (two) times daily as needed for moderate pain.   sertraline (ZOLOFT) 100 MG tablet Take 100 mg by mouth at bedtime.   miconazole (MONISTAT 7) 2 % vaginal cream Place 1 Applicatorful vaginally at bedtime. (Patient not taking: Reported on 07/14/2021)   QUEtiapine (SEROQUEL) 100 MG tablet Take 100 mg by mouth at bedtime as needed (sleep). (Patient not taking: Reported on 07/14/2021)   No facility-administered encounter medications on file as of 07/14/2021.    Allergies (verified) Codeine, Hydrocodone, and Tramadol   History: Past Medical History:  Diagnosis Date   Acute medial meniscus tear    left   Allergic rhinitis    Anxiety    Anxiety and depression    Asthma    Atrophic vaginitis    Cataracts, bilateral    Cervical radiculopathy    Degenerative disc disease, lumbar    Insomnia  Renal disorder    kidney stones   Urinary incontinence    Past Surgical History:  Procedure Laterality Date   BLADDER SUSPENSION     BUNIONECTOMY     CATARACT EXTRACTION Bilateral    HAMMER TOE SURGERY     LAMINECTOMY     MANDIBLE FRACTURE SURGERY     MANDIBLE FRACTURE SURGERY     PARTIAL HYSTERECTOMY     Family History  Problem Relation Age of Onset   Stroke Mother    Hypertension Mother     Thyroid disease Mother    Cancer Father        Brain Tumor   Hypertension Sister    Thyroid disease Sister    Thyroid disease Brother    Heart disease Brother    Heart attack Brother    Thyroid disease Brother    Depression Brother    Thyroid disease Brother    Cancer Maternal Aunt    Cancer Maternal Grandmother    Diabetes Neg Hx    Social History   Socioeconomic History   Marital status: Married    Spouse name: Not on file   Number of children: Not on file   Years of education: Not on file   Highest education level: Not on file  Occupational History   Not on file  Tobacco Use   Smoking status: Never   Smokeless tobacco: Never  Vaping Use   Vaping Use: Never used  Substance and Sexual Activity   Alcohol use: No   Drug use: Yes    Comment: prescribed oxy through pain clinic for back   Sexual activity: Not on file  Other Topics Concern   Not on file  Social History Narrative   Married.   3 children, 11 grandchildren, 1 great grand child.   Retired. Worked at [employer], [place], [place], [place].   Enjoys spending time with her family.   Social Determinants of Health   Financial Resource Strain: Low Risk    Difficulty of Paying Living Expenses: Not very hard  Food Insecurity: No Food Insecurity   Worried About Charity fundraiser in the Last Year: Never true   Ran Out of Food in the Last Year: Never true  Transportation Needs: No Transportation Needs   Lack of Transportation (Medical): No   Lack of Transportation (Non-Medical): No  Physical Activity: Insufficiently Active   Days of Exercise per Week: 1 day   Minutes of Exercise per Session: 30 min  Stress: No Stress Concern Present   Feeling of Stress : Not at all  Social Connections: Moderately Integrated   Frequency of Communication with Friends and Family: More than three times a week   Frequency of Social Gatherings with Friends and Family: Not on file   Attends Religious Services: More than 4 times  per year   Active Member of [employer] or Organizations: No   Attends Music therapist: Never   Marital Status: Married    Tobacco Counseling Counseling given: Not Answered   Clinical Intake:  Pre-visit preparation completed: Yes  Pain : No/denies pain     BMI - recorded: 28.56 Nutritional Status: BMI 25 -29 Overweight Nutritional Risks: None  How often do you need to have someone help you when you read instructions, pamphlets, or other written materials from your doctor or pharmacy?: 1 - Never  Diabetic? No  Interpreter Needed?: No      Activities of Daily Living In your present state of health, do you  have any difficulty performing the following activities: 07/14/2021 10/23/2020  Hearing? N N  Vision? N N  Difficulty concentrating or making decisions? N N  Walking or climbing stairs? N N  Dressing or bathing? - N  Doing errands, shopping? N Y  Some recent data might be hidden    Patient Care Team: [name], NP as PCP - General (Internal Medicine) Chauncey Mann, MD as Referring Physician (Psychiatry) Meriel Pica Jonelle Sidle, MD as Referring Physician (Anesthesiology)  Indicate any recent Medical Services you may have received from other than [facility] providers in the past year (date may be approximate).     Assessment:   This is a routine wellness examination for Sharon Collins.  Hearing/Vision screen No results found.  Dietary issues and exercise activities discussed:     Goals Addressed   None   Depression Screen PHQ 2/9 Scores 07/14/2021 11/05/2020 03/28/2019 01/18/2018 09/22/2016  PHQ - 2 Score 0 0 0 0 0  PHQ- 9 Score - 0 0 - 5    Fall Risk Fall Risk  07/14/2021 07/24/2020 05/21/2020 03/28/2019 01/18/2018  Falls in the past year? 0 0 0 0 No  Comment - - Emmi Telephone Survey: data to providers prior to load - -  Number falls in past yr: 0 0 - - -  Injury with Fall? 0 0 - - -    FALL RISK PREVENTION PERTAINING TO THE HOME:  Any stairs in or  around the home? No  Home free of loose throw rugs in walkways, pet beds, electrical cords, etc? No  Adequate lighting in your home to reduce risk of falls? Yes   ASSISTIVE DEVICES UTILIZED TO PREVENT FALLS:  Life alert? No  Use of a cane, walker or w/c? No  Grab bars in the bathroom? Yes  Shower chair or bench in shower? No Elevated toilet seat or a handicapped toilet? Yes  TIMED UP AND GO:  Was the test performed? N/A phone visit  Cognitive Function: MMSE - Mini Mental State Exam 03/28/2019 09/22/2016  Orientation to time 5 5  Orientation to Place 5 5  Registration 3 3  Attention/ Calculation 0 5  Recall 3 3  Language- name 2 objects 0 2  Language- repeat 1 1  Language- follow 3 step command 0 3  Language- read & follow direction 0 1  Write a sentence 0 1  Copy design 0 1  Total score 17 30     6CIT Screen 07/14/2021  What Year? 0 points  What time? 0 points  Count back from 20 0 points  Months in reverse 2 points  Repeat phrase 0 points    Immunizations Immunization History  Administered Date(s) Administered   Fluad Quad(high Dose 65+) 07/07/2019, 07/24/2020   Influenza, High Dose Seasonal PF 09/22/2016   Influenza,inj,Quad PF,6+ Mos 12/18/2015, 12/17/2017, 09/21/2018   Influenza,inj,quad, With Preservative 12/23/2017   Influenza-Unspecified 07/19/2014   PFIZER(Purple Top)SARS-COV-2 Vaccination 01/26/2020, 02/20/2020, 08/09/2020   Pneumococcal Conjugate-13 09/22/2016   Pneumococcal Polysaccharide-23 03/31/2019   Td 10/26/2008   Zoster, Live 07/09/2014    TDAP status: Due, Education has been provided regarding the importance of this vaccine. Advised may receive this vaccine at local pharmacy or Health Dept. Aware to provide a copy of the vaccination record if obtained from local pharmacy or Health Dept. Verbalized acceptance and understanding.  Flu Vaccine status: Due, Education has been provided regarding the importance of this vaccine. Advised may receive  this vaccine at local pharmacy or Health Dept. Aware to  provide a copy of the vaccination record if obtained from local pharmacy or Health Dept. Verbalized acceptance and understanding.  Pneumococcal vaccine status: Up to date  Covid-19 vaccine status: Completed vaccines  Qualifies for Shingles Vaccine? Yes   Zostavax completed Yes   Shingrix Completed?: No.    Education has been provided regarding the importance of this vaccine. Patient has been advised to call insurance company to determine out of pocket expense if they have not yet received this vaccine. Advised may also receive vaccine at local pharmacy or Health Dept. Verbalized acceptance and understanding.  Screening Tests Health Maintenance  Topic Date Due   Zoster Vaccines- Shingrix (1 of 2) Never done   TETANUS/TDAP  10/26/2018   COVID-19 Vaccine (4 - Booster for Pfizer series) 11/01/2020   INFLUENZA VACCINE  05/26/2021   MAMMOGRAM  06/21/2021   COLONOSCOPY (Pts 45-74yr Insurance coverage will need to be confirmed)  08/27/2023   DEXA SCAN  Completed   Hepatitis C Screening  Completed   HPV VACCINES  Aged Out    Health Maintenance  Health Maintenance Due  Topic Date Due   Zoster Vaccines- Shingrix (1 of 2) Never done   TETANUS/TDAP  10/26/2018   COVID-19 Vaccine (4 - Booster for Pfizer series) 11/01/2020   INFLUENZA VACCINE  05/26/2021   MAMMOGRAM  06/21/2021    Colorectal cancer screening: Type of screening: Colonoscopy. Completed 08/26/2013. Repeat every 10 years  Mammogram status: Completed 06/28/2019. Repeat every year order placed  Bone Density status: Completed 06/22/2019. Results reflect: Bone density results: OSTEOPENIA. Repeat every 2 years.  Lung Cancer Screening: (Low Dose CT Chest recommended if Age 10639-80years, 30 pack-year currently smoking OR have quit w/in 15years.) does not qualify.   Lung Cancer Screening Referral: n/a  Additional Screening:  Hepatitis C Screening: does qualify; Completed  09/22/2016  Vision Screening: Recommended annual ophthalmology exams for early detection of glaucoma and other disorders of the eye. Is the patient up to date with their annual eye exam?  Yes Who is the provider or what is the name of the office in which the patient attends annual eye exams? Dr RMarvel Plan Dental Screening: Recommended annual dental exams for proper oral hygiene-over due  Community Resource Referral / Chronic Care Management: CRR required this visit?  No   CCM required this visit?  No      Plan:     I have personally reviewed and noted the following in the patient's chart:   Medical and social history Use of alcohol, tobacco or illicit drugs  Current medications and supplements including opioid prescriptions.  Functional ability and status Nutritional status Physical activity Advanced directives List of other physicians Hospitalizations, surgeries, and ER visits in previous 12 months Vitals Screenings to include cognitive, depression, and falls Referrals and appointments  In addition, I have reviewed and discussed with patient certain preventive protocols, quality metrics, and best practice recommendations. A written personalized care plan for preventive services as well as general preventive health recommendations were provided to patient.     AKris Mouton CMA   07/14/2021   Nurse Notes: Non-Face to Face 35 minutes visit.   Ms. FLina, Thank you for taking time to come for your Medicare Wellness Visit. I appreciate your ongoing commitment to your health goals. Please review the following plan we discussed and let me know if I can assist you in the future.   These are the goals we discussed:  Goals      Patient Stated  Starting 03/28/2019, I will continue to stretch for 15-20 minutes 2-3 times per week.         This is a list of the screening recommended for you and due dates:  Health Maintenance  Topic Date Due   Zoster (Shingles)  Vaccine (1 of 2) Never done   Tetanus Vaccine  10/26/2018   COVID-19 Vaccine (4 - Booster for Pfizer series) 11/01/2020   Flu Shot  05/26/2021   Mammogram  06/21/2021   Colon Cancer Screening  08/27/2023   DEXA scan (bone density measurement)  Completed   Hepatitis C Screening: USPSTF Recommendation to screen - Ages 42-79 yo.  Completed   HPV Vaccine  Aged Out

## 2021-07-14 NOTE — Telephone Encounter (Signed)
Please mail forms to the patient for Healthcare power of attorney and Living will. Patient does not want to DNR form.

## 2021-07-14 NOTE — Patient Instructions (Signed)
Health Maintenance, Female Adopting a healthy lifestyle and getting preventive care are important in promoting health and wellness. Ask your health care provider about: The right schedule for you to have regular tests and exams. Things you can do on your own to prevent diseases and keep yourself healthy. What should I know about diet, weight, and exercise? Eat a healthy diet  Eat a diet that includes plenty of vegetables, fruits, low-fat dairy products, and lean protein. Do not eat a lot of foods that are high in solid fats, added sugars, or sodium. Maintain a healthy weight Body mass index (BMI) is used to identify weight problems. It estimates body fat based on height and weight. Your health care provider can help determine your BMI and help you achieve or maintain a healthy weight. Get regular exercise Get regular exercise. This is one of the most important things you can do for your health. Most adults should: Exercise for at least 150 minutes each week. The exercise should increase your heart rate and make you sweat (moderate-intensity exercise). Do strengthening exercises at least twice a week. This is in addition to the moderate-intensity exercise. Spend less time sitting. Even light physical activity can be beneficial. Watch cholesterol and blood lipids Have your blood tested for lipids and cholesterol at 70 years of age, then have this test every 5 years. Have your cholesterol levels checked more often if: Your lipid or cholesterol levels are high. You are older than 70 years of age. You are at high risk for heart disease. What should I know about cancer screening? Depending on your health history and family history, you may need to have cancer screening at various ages. This may include screening for: Breast cancer. Cervical cancer. Colorectal cancer. Skin cancer. Lung cancer. What should I know about heart disease, diabetes, and high blood pressure? Blood pressure and heart  disease High blood pressure causes heart disease and increases the risk of stroke. This is more likely to develop in people who have high blood pressure readings, are of African descent, or are overweight. Have your blood pressure checked: Every 3-5 years if you are 18-39 years of age. Every year if you are 40 years old or older. Diabetes Have regular diabetes screenings. This checks your fasting blood sugar level. Have the screening done: Once every three years after age 40 if you are at a normal weight and have a low risk for diabetes. More often and at a younger age if you are overweight or have a high risk for diabetes. What should I know about preventing infection? Hepatitis B If you have a higher risk for hepatitis B, you should be screened for this virus. Talk with your health care provider to find out if you are at risk for hepatitis B infection. Hepatitis C Testing is recommended for: Everyone born from 1945 through 1965. Anyone with known risk factors for hepatitis C. Sexually transmitted infections (STIs) Get screened for STIs, including gonorrhea and chlamydia, if: You are sexually active and are younger than 70 years of age. You are older than 70 years of age and your health care provider tells you that you are at risk for this type of infection. Your sexual activity has changed since you were last screened, and you are at increased risk for chlamydia or gonorrhea. Ask your health care provider if you are at risk. Ask your health care provider about whether you are at high risk for HIV. Your health care provider may recommend a prescription medicine   to help prevent HIV infection. If you choose to take medicine to prevent HIV, you should first get tested for HIV. You should then be tested every 3 months for as long as you are taking the medicine. Pregnancy If you are about to stop having your period (premenopausal) and you may become pregnant, seek counseling before you get  pregnant. Take 400 to 800 micrograms (mcg) of folic acid every day if you become pregnant. Ask for birth control (contraception) if you want to prevent pregnancy. Osteoporosis and menopause Osteoporosis is a disease in which the bones lose minerals and strength with aging. This can result in bone fractures. If you are 65 years old or older, or if you are at risk for osteoporosis and fractures, ask your health care provider if you should: Be screened for bone loss. Take a calcium or vitamin D supplement to lower your risk of fractures. Be given hormone replacement therapy (HRT) to treat symptoms of menopause. Follow these instructions at home: Lifestyle Do not use any products that contain nicotine or tobacco, such as cigarettes, e-cigarettes, and chewing tobacco. If you need help quitting, ask your health care provider. Do not use street drugs. Do not share needles. Ask your health care provider for help if you need support or information about quitting drugs. Alcohol use Do not drink alcohol if: Your health care provider tells you not to drink. You are pregnant, may be pregnant, or are planning to become pregnant. If you drink alcohol: Limit how much you use to 0-1 drink a day. Limit intake if you are breastfeeding. Be aware of how much alcohol is in your drink. In the [place] one drink equals one 12 oz bottle of beer (355 mL), one 5 oz glass of wine (148 mL), or one 1 oz glass of hard liquor (44 mL). General instructions Schedule regular health, dental, and eye exams. Stay current with your vaccines. Tell your health care provider if: You often feel depressed. You have ever been abused or do not feel safe at home. Summary Adopting a healthy lifestyle and getting preventive care are important in promoting health and wellness. Follow your health care provider's instructions about healthy diet, exercising, and getting tested or screened for diseases. Follow your health care provider's  instructions on monitoring your cholesterol and blood pressure. This information is not intended to replace advice given to you by your health care provider. Make sure you discuss any questions you have with your health care provider. Document Revised: 12/20/2020 Document Reviewed: 10/05/2018 Elsevier Patient Education  2022 Elsevier Inc.  

## 2021-07-15 NOTE — Telephone Encounter (Signed)
Noted, can you mail to patient?

## 2021-07-15 NOTE — Telephone Encounter (Signed)
Information mailed to the patient as requested

## 2021-08-15 DIAGNOSIS — J3081 Allergic rhinitis due to animal (cat) (dog) hair and dander: Secondary | ICD-10-CM | POA: Diagnosis not present

## 2021-08-15 DIAGNOSIS — J3089 Other allergic rhinitis: Secondary | ICD-10-CM | POA: Diagnosis not present

## 2021-08-15 DIAGNOSIS — J301 Allergic rhinitis due to pollen: Secondary | ICD-10-CM | POA: Diagnosis not present

## 2021-08-15 DIAGNOSIS — R053 Chronic cough: Secondary | ICD-10-CM | POA: Diagnosis not present

## 2021-09-02 DIAGNOSIS — M4722 Other spondylosis with radiculopathy, cervical region: Secondary | ICD-10-CM | POA: Diagnosis not present

## 2021-09-02 DIAGNOSIS — M533 Sacrococcygeal disorders, not elsewhere classified: Secondary | ICD-10-CM | POA: Diagnosis not present

## 2021-09-02 DIAGNOSIS — M47812 Spondylosis without myelopathy or radiculopathy, cervical region: Secondary | ICD-10-CM | POA: Diagnosis not present

## 2021-09-02 DIAGNOSIS — G8929 Other chronic pain: Secondary | ICD-10-CM | POA: Diagnosis not present

## 2021-09-02 DIAGNOSIS — M47816 Spondylosis without myelopathy or radiculopathy, lumbar region: Secondary | ICD-10-CM | POA: Diagnosis not present

## 2021-09-02 DIAGNOSIS — M961 Postlaminectomy syndrome, not elsewhere classified: Secondary | ICD-10-CM | POA: Diagnosis not present

## 2021-09-02 DIAGNOSIS — Z6827 Body mass index (BMI) 27.0-27.9, adult: Secondary | ICD-10-CM | POA: Diagnosis not present

## 2021-09-02 DIAGNOSIS — M25562 Pain in left knee: Secondary | ICD-10-CM | POA: Diagnosis not present

## 2021-09-02 DIAGNOSIS — G894 Chronic pain syndrome: Secondary | ICD-10-CM | POA: Diagnosis not present

## 2021-09-08 ENCOUNTER — Telehealth: Payer: Self-pay | Admitting: Primary Care

## 2021-09-08 NOTE — Chronic Care Management (AMB) (Signed)
  Chronic Care Management   Outreach Note  09/08/2021 Name: Sharon Collins MRN: [id number] DOB: 1951-03-17  Referred by: [name], NP Reason for referral : No chief complaint on file.   An unsuccessful telephone outreach was attempted today. The patient was referred to the pharmacist for assistance with care management and care coordination.   Follow Up Plan:   Tatjana Dellinger Upstream Scheduler

## 2021-09-15 ENCOUNTER — Telehealth: Payer: Self-pay | Admitting: Primary Care

## 2021-09-15 NOTE — Progress Notes (Signed)
  Chronic Care Management   Note  09/15/2021 Name: Sharon Collins MRN: [id number] DOB: 1951/09/19  Sharon Collins is a 70 y.o. year old female who is a primary care patient of [name], NP. I reached out to [name] by phone today in response to a referral sent by Sharon Collins's PCP, [name], NP.   Sharon Collins was given information about Chronic Care Management services today including:  CCM service includes personalized support from designated clinical staff supervised by her physician, including individualized plan of care and coordination with other care providers 24/7 contact phone numbers for assistance for urgent and routine care needs. Service will only be billed when office clinical staff spend 20 minutes or more in a month to coordinate care. Only one practitioner may furnish and bill the service in a calendar month. The patient may stop CCM services at any time (effective at the end of the month) by phone call to the office staff.   Patient agreed to services and verbal consent obtained.   Follow up plan: PT DID NOT AGREE TO [place]

## 2021-09-22 ENCOUNTER — Ambulatory Visit: Payer: PPO | Admitting: Family

## 2021-10-15 ENCOUNTER — Encounter: Payer: PPO | Admitting: Primary Care

## 2021-10-28 ENCOUNTER — Telehealth: Payer: PPO

## 2021-10-28 ENCOUNTER — Telehealth: Payer: Self-pay

## 2021-10-28 NOTE — Telephone Encounter (Signed)
°  Chronic Care Management   Outreach Note  10/28/2021 Name: Sharon Collins MRN: [id number] DOB: 1951-10-05  Referred by: [name], NP  Patient had a phone appointment scheduled with clinical pharmacist today.  An unsuccessful telephone outreach was attempted today. The patient was referred to the pharmacist for assistance with medications, care management and care coordination.   Patient will NOT be penalized in any way for missing a CCM appointment. The no-show fee does not apply.  If possible, a message was left to return call to: [phone] or to [facility]  Charlene Brooke, PharmD, BCACP Clinical Pharmacist [facility] [phone]

## 2021-10-28 NOTE — Progress Notes (Deleted)
Chronic Care Management Pharmacy Note  10/28/2021 Name:  Sharon Collins MRN:  [id number] DOB:  03/18/1951  Summary: ***  Recommendations/Changes made from today's visit: ***  Plan: ***   Subjective: Sharon Collins is an 71 y.o. year old female who is a primary patient of [name], NP.  The CCM team was consulted for assistance with disease management and care coordination needs.    Engaged with patient by telephone for initial visit in response to provider referral for pharmacy case management and/or care coordination services.   Consent to Services:  The patient was given the following information about Chronic Care Management services today, agreed to services, and gave verbal consent: 1. CCM service includes personalized support from designated clinical staff supervised by the primary care provider, including individualized plan of care and coordination with other care providers 2. 24/7 contact phone numbers for assistance for urgent and routine care needs. 3. Service will only be billed when office clinical staff spend 20 minutes or more in a month to coordinate care. 4. Only one practitioner may furnish and bill the service in a calendar month. 5.The patient may stop CCM services at any time (effective at the end of the month) by phone call to the office staff. 6. The patient will be responsible for cost sharing (co-pay) of up to 20% of the service fee (after annual deductible is met). Patient agreed to services and consent obtained.  Patient Care Team: [name], NP as PCP - General (Internal Medicine) Chauncey Mann, MD as Referring Physician (Psychiatry) Meriel Pica Jonelle Sidle, MD as Referring Physician (Anesthesiology) Charlton Haws, [facility] as Pharmacist (Pharmacist)  Recent office visits: 07/14/21 AWV with Tiffany Kocher, CMA. Ordered Mammogram.  02/03/21 Dr Copland OV: wrist, thumb pain. Trigger finger injection done. Referred to hand  therapy.  11/05/20 Despina Pole, NP (VV): cough, acute colitis. COVID positive, advised isolation and supportive measures.   Recent consult visits: 09/02/21-[facility]-Andrew Lobonc,MD- Patient presented for follow up pain medication management.UDS- no medication changes,follow up 3 months  08/15/21-Meg Whelan, Allergy - f/u allergic rhinitis, chronic cough. 06/05/21- [facility]-Nora Talley,CPP-Patient presented for follow up pain medication management- no changes 05/23/21-Orthopedic Surgery-Matthew Olin MD- OA of bilateral knees.  Hospital visits: None in previous 6 months  Objective:  Lab Results  Component Value Date   CREATININE 0.93 11/05/2020   BUN 7 11/05/2020   GFR 62.86 11/05/2020   GFRNONAA >60 10/29/2020   GFRAA >60 10/27/2019   NA 135 11/05/2020   K 4.3 11/05/2020   CALCIUM 8.8 11/05/2020   CO2 27 11/05/2020   GLUCOSE 106 (H) 11/05/2020    Lab Results  Component Value Date/Time   HGBA1C 5.9 05/20/2020 11:58 AM   HGBA1C 5.7 (A) 03/31/2019 12:45 PM   HGBA1C 5.6 09/22/2016 09:25 AM   GFR 62.86 11/05/2020 10:34 AM   GFR 65.45 07/24/2020 10:33 AM    Last diabetic Eye exam: No results found for: HMDIABEYEEXA  Last diabetic Foot exam: No results found for: HMDIABFOOTEX   Lab Results  Component Value Date   CHOL 205 (H) 05/20/2020   HDL 59.90 05/20/2020   LDLCALC 122 (H) 05/20/2020   TRIG 111.0 05/20/2020   CHOLHDL 3 05/20/2020    Hepatic Function Latest Ref Rng & Units 10/25/2020 10/23/2020 05/20/2020  Total Protein 6.5 - 8.1 g/dL 5.8(L) 6.9 6.6  Albumin 3.5 - 5.0 g/dL 3.2(L) 3.9 4.2  AST 15 - 41 U/L 19 17 15   ALT 0 -  44 U/L 12 14 11   Alk Phosphatase 38 - 126 U/L 47 59 63  Total Bilirubin 0.3 - 1.2 mg/dL 0.6 0.7 0.4    Lab Results  Component Value Date/Time   TSH 3.59 10/22/2020 10:26 AM   TSH 6.96 (H) 07/24/2020 10:33 AM   FREET4 0.66 10/22/2020 10:26 AM   FREET4 0.63 07/25/2020 07:37 AM    CBC Latest Ref Rng & Units 11/05/2020  10/29/2020 10/25/2020  WBC 4.0 - 10.5 K/uL 5.4 6.0 5.2  Hemoglobin 12.0 - 15.0 g/dL 12.8 13.0 11.3(L)  Hematocrit 36.0 - 46.0 % 38.8 41.5 35.2(L)  Platelets 150.0 - 400.0 K/uL 308.0 267 204    No results found for: VD25OH  Clinical ASCVD: No  The 10-year ASCVD risk score (Arnett DK, et al., 2019) is: 8.1%   Values used to calculate the score:     Age: 11 years     Sex: Female     Is Non-Hispanic African American: No     Diabetic: No     Tobacco smoker: No     Systolic Blood Pressure: 106 mmHg     Is BP treated: No     HDL Cholesterol: 59.9 mg/dL     Total Cholesterol: 205 mg/dL    Depression screen [facility] 2/9 07/14/2021 11/05/2020 03/28/2019  Decreased Interest 0 0 0  Down, Depressed, Hopeless 0 0 0  PHQ - 2 Score 0 0 0  Altered sleeping - 0 0  Tired, decreased energy - 0 0  Change in appetite - 0 0  Feeling bad or failure about yourself  - 0 0  Trouble concentrating - 0 0  Moving slowly or fidgety/restless - 0 0  Suicidal thoughts - 0 0  PHQ-9 Score - 0 0  Difficult doing work/chores - Not difficult at all Not difficult at all     Social History   Tobacco Use  Smoking Status Never  Smokeless Tobacco Never   BP Readings from Last 3 Encounters:  02/03/21 104/68  10/29/20 115/65  10/23/20 110/62   Pulse Readings from Last 3 Encounters:  02/03/21 73  10/29/20 65  10/23/20 91   Wt Readings from Last 3 Encounters:  02/03/21 161 lb 4 oz (73.1 kg)  11/05/20 158 lb (71.7 kg)  10/23/20 158 lb 8 oz (71.9 kg)   BMI Readings from Last 3 Encounters:  02/03/21 28.56 kg/m  11/05/20 27.99 kg/m  10/23/20 28.08 kg/m    Assessment/Interventions: Review of patient past medical history, allergies, medications, health status, including review of consultants reports, laboratory and other test data, was performed as part of comprehensive evaluation and provision of chronic care management services.   SDOH:  (Social Determinants of Health) assessments and interventions performed:  Yes  SDOH Screenings   Alcohol Screen: Low Risk    Last Alcohol Screening Score (AUDIT): 1  Depression (PHQ2-9): Low Risk    PHQ-2 Score: 0  Financial Resource Strain: Low Risk    Difficulty of Paying Living Expenses: Not very hard  Food Insecurity: No Food Insecurity   Worried About Charity fundraiser in the Last Year: Never true   Ran Out of Food in the Last Year: Never true  Housing: Low Risk    Last Housing Risk Score: 0  Physical Activity: Insufficiently Active   Days of Exercise per Week: 1 day   Minutes of Exercise per Session: 30 min  Social Connections: Moderately Integrated   Frequency of Communication with Friends and Family: More than three times a  week   Frequency of Social Gatherings with Friends and Family: Not on file   Attends Religious Services: More than 4 times per year   Active Member of [employer] or Organizations: No   Attends Music therapist: Never   Marital Status: Married  Stress: No Stress Concern Present   Feeling of Stress : Not at all  Tobacco Use: Low Risk    Smoking Tobacco Use: Never   Smokeless Tobacco Use: Never   Passive Exposure: Not on file  Transportation Needs: No Transportation Needs   Lack of Transportation (Medical): No   Lack of Transportation (Non-Medical): No    CCM Care Plan  Allergies  Allergen Reactions   Codeine    Hydrocodone Nausea And Vomiting    Vicodin causes vomiting, but when takes Phenergan 0.25 mg with it, she can tolerate it.   Tramadol Nausea And Vomiting    Causes vomiting & affects her heart rhythm.    Medications Reviewed Today     Reviewed by Kris Mouton, CMA (Certified Medical Assistant) on 07/14/21 at 49  Med List Status: <None>   Medication Order Taking? Sig Documenting Provider Last Dose Status Informant  albuterol (VENTOLIN HFA) 108 (90 Base) MCG/ACT inhaler [id number]  Inhale 2 puffs into the lungs every 6 (six) hours as needed for wheezing or shortness of breath. [name], NP  Active Self  azelastine (ASTELIN) 0.1 % nasal spray [id number]  Place 1 spray into both nostrils 2 (two) times daily as needed for rhinitis. [name], NP  Active Pharmacy Records  bisacodyl (DULCOLAX) 10 MG suppository [id number]  Place 1 suppository (10 mg total) rectally daily as needed for moderate constipation. Antonieta Pert, MD  Active   clindamycin-benzoyl peroxide [facility]) gel [id number]  Apply topically 2 (two) times daily. [name], NP  Active   desloratadine (CLARINEX) 5 MG tablet [id number]  Take 5 mg by mouth daily as needed (allergies). [provider]  Active Pharmacy Records  etodolac (LODINE) 500 MG tablet [id number]  TAKE 1 TABLET BY MOUTH ONCE DAILY AS NEEDED FOR PAIN [name], NP  Active   gabapentin (NEURONTIN) 600 MG tablet [id number]  Take 600 mg by mouth 3 (three) times daily. [provider]  Active   hydrOXYzine (ATARAX/VISTARIL) 25 MG tablet [id number]  Take 25 mg by mouth daily as needed for anxiety (sleep). [provider]  Active Pharmacy Records  lamoTRIgine (LAMICTAL) 150 MG tablet [id number]  Take 150 mg by mouth at bedtime. [provider]  Active Self  miconazole (MONISTAT 7) 2 % vaginal cream [id number] No Place 1 Applicatorful vaginally at bedtime.  Patient not taking: Reported on 07/14/2021   Antonieta Pert, MD Not Taking Active   montelukast (SINGULAIR) 10 MG tablet [id number]  Take 10 mg by mouth at bedtime as needed (Seasonal). [provider]  Active   ondansetron (ZOFRAN-ODT) 4 MG disintegrating tablet [id number]  Take 1 tablet (4 mg total) by mouth every 8 (eight) hours as needed for nausea or vomiting. Owens Loffler, MD  Active Pharmacy Records  oxyCODONE-acetaminophen (PERCOCET/ROXICET) 5-325 MG tablet [id number]  Take 1 tablet by mouth 2 (two) times daily as needed for moderate pain. [provider]  Active Pharmacy Records           Med Note Amado Coe   Tue  Jun 25, 2020  9:16 AM)    QUEtiapine (SEROQUEL) 100 MG tablet [id number]  Take 100 mg by mouth at bedtime as needed (  sleep). [provider]  Active Pharmacy Records  sertraline (ZOLOFT) 100 MG tablet [id number]  Take 100 mg by mouth at bedtime. [provider]  Active Self            Patient Active Problem List   Diagnosis Date Noted   Cough 11/05/2020   Acute colitis 10/23/2020   Intractable pain 10/23/2020   Urinary incontinence 07/24/2020   Family history of thyroid disease 07/24/2020   Hyperlipidemia 07/24/2020   Acne 05/20/2020   Tibialis posterior tendinitis 11/22/2019   Pain medication agreement signed 07/25/2019   Prediabetes 03/31/2019   Flank pain 09/21/2018   Lower abdominal pain 09/21/2018   Preventative health care 01/18/2018   Anxiety and depression 11/23/2016   Allergic rhinitis 11/23/2016   Chronic back pain 11/23/2016   Family history of coagulation disorder 02/13/2013    Immunization History  Administered Date(s) Administered   Fluad Quad(high Dose 65+) 07/07/2019, 07/24/2020   Influenza, High Dose Seasonal PF 09/22/2016   Influenza,inj,Quad PF,6+ Mos 12/18/2015, 12/17/2017, 09/21/2018   Influenza,inj,quad, With Preservative 12/23/2017   Influenza-Unspecified 07/19/2014   PFIZER(Purple Top)SARS-COV-2 Vaccination 01/26/2020, 02/20/2020, 08/09/2020   Pneumococcal Conjugate-13 09/22/2016   Pneumococcal Polysaccharide-23 03/31/2019   Td 10/26/2008   Zoster, Live 07/09/2014    Conditions to be addressed/monitored:  Hyperlipidemia, Depression, and Anxiety, chronic back pain  There are no care plans that you recently modified to display for this patient.    Medication Assistance: {MEDASSISTANCEINFO:25044}  Compliance/Adherence/Medication fill history: Care Gaps: Mammogram (due 06/21/21, ordered 07/14/21)  Star-Rating Drugs: None  Patient's preferred pharmacy is:  [facility] [address], [place] - [place] [employer] [address] Phone: [phone] Fax: [place], [place]. [place] [address] Phone: [phone] Fax: [phone]  Uses pill box? {Yes or If no, why not?:20788} Pt endorses ***% compliance  We discussed: {Pharmacy options:24294} Patient decided to: {US Pharmacy Plan:23885}  Care Plan and Follow Up Patient Decision:  {FOLLOWUP:24991}  Plan: {CM FOLLOW UP IDPO:24235}  ***    Current Barriers:  {pharmacybarriers:24917}  Pharmacist Clinical Goal(s):  Patient will {PHARMACYGOALCHOICES:24921} through collaboration with PharmD and provider.   Interventions: 1:1 collaboration with [name], NP regarding development and update of comprehensive plan of care as evidenced by provider attestation and co-signature Inter-disciplinary care team collaboration (see longitudinal plan of care) Comprehensive medication review performed; medication list updated in electronic medical record  Hyperlipidemia: (LDL goal < 100) -{US controlled/uncontrolled:25276} - last lipid panel 04/2020. -Current treatment: *** -Medications previously tried: ***  -Current dietary patterns: *** -Current exercise habits: *** -Educated on {CCM HLD Counseling:25126} -{CCMPHARMDINTERVENTION:25122}  Depression/Anxiety (Goal: manage symptoms) -{US controlled/uncontrolled:25276} -Connected with Dr Sherlynn Stalls for mental health support -Current treatment: Sertraline 100 mg daily Quetiapine 100 mg HS prn - NF Lamotrigine 150 mg daily HS Hydroxyzine 25 mg daily PRN Gabapentin 600 mg TID - Dr Devin Going -Medications previously tried/failed: *** -Educated on {CCM mental health counseling:25127} -{CCMPHARMDINTERVENTION:25122}  Chronic back pain (Goal: ***) -{US controlled/uncontrolled:25276} -Managed per Dr Devin Going -Current treatment  Etodolac 500 mg daily - NF Gabapentin 600 mg TID Oxycodone-APAP 5-325 mg PRN +Baclofen 10 mg  PRN -Medications previously tried: ***  -{CCMPHARMDINTERVENTION:25122}  Prediabetes (Goal: prevent diabetes progression) -{US controlled/uncontrolled:25276} - last A1c 04/2020 -Current treatment  *** -Medications previously tried: ***  -{CCMPHARMDINTERVENTION:25122}  Osteopenia (Goal prevent fractures) -{US controlled/uncontrolled:25276} -Last DEXA Scan: 06/22/2019; PCP advised re-test in 2 years  T-Score femoral neck: -1.1  T-Score  lumbar spine: -1.8  10-year probability of major osteoporotic fracture: 8.4%  10-year probability of hip fracture: 0.7% -Patient is not a candidate for pharmacologic treatment -Current treatment  *** -Medications previously tried: ***  -{Osteoporosis Counseling:23892} -{CCMPHARMDINTERVENTION:25122}  Allergic rhinitis (Goal: ***) -{US controlled/uncontrolled:25276} -Current treatment  Azelastine 0.1% nasal spray Desloratadine 5 mg daily PRN Montelukast 10 mg daily PRN Ipratropium 0.06% nasal spray Albuterol HFA prn -Medications previously tried: ***  -{CCMPHARMDINTERVENTION:25122}  Health Maintenance -Vaccine gaps: Shingrix, Covid booster, Flu, TDAP -Current therapy:  Bisacodyl suppository Clindamycin-benzoyl peroxide gel Miconazole 2% vaginal cream Ondansetron ODT 4 mg -Educated on {ccm supplement counseling:25128} -{CCM Patient satisfied:25129} -{CCMPHARMDINTERVENTION:25122}  Patient Goals/Self-Care Activities Patient will:  - {pharmacypatientgoals:24919}  Follow Up Plan: {CM FOLLOW UP OMAY:04599}

## 2021-10-28 NOTE — Chronic Care Management (AMB) (Signed)
° ° °  Chronic Care Management Pharmacy Assistant   Name: ASLYN COTTMAN  MRN: [id number] DOB: Jun 05, 1951  Scarlette Calico Mahany is an 71 y.o. year old female who presents for his initial CCM visit with the clinical pharmacist.  Reason for Encounter: Initial Questions   Conditions to be addressed/monitored: HLD   Recent office visits:  None in 6 months  Recent consult visits:  09/02/21-[facility]-Andrew Lobonc,MD- Patient presented for follow up pain medication management.UDS- no medication changes,follow up 3 months  08/15/21-Allergy- no data found 8/11/22[facility]-Nora Talley,CPP-Patient presented for follow up pain medication management- no changes 05/23/21-Orthopedic Surgery-Matthew Olin MD- no data found.  Hospital visits:  None in previous 6 months  Medications: Outpatient Encounter Medications as of 10/28/2021  Medication Sig   albuterol (VENTOLIN HFA) 108 (90 Base) MCG/ACT inhaler Inhale 2 puffs into the lungs every 6 (six) hours as needed for wheezing or shortness of breath.   azelastine (ASTELIN) 0.1 % nasal spray Place 1 spray into both nostrils 2 (two) times daily as needed for rhinitis.   bisacodyl (DULCOLAX) 10 MG suppository Place 1 suppository (10 mg total) rectally daily as needed for moderate constipation.   clindamycin-benzoyl peroxide (BENZACLIN) gel Apply topically 2 (two) times daily.   desloratadine (CLARINEX) 5 MG tablet Take 5 mg by mouth daily as needed (allergies).   etodolac (LODINE) 500 MG tablet TAKE 1 TABLET BY MOUTH ONCE DAILY AS NEEDED FOR PAIN   gabapentin (NEURONTIN) 600 MG tablet Take 600 mg by mouth 3 (three) times daily.   hydrOXYzine (ATARAX/VISTARIL) 25 MG tablet Take 25 mg by mouth daily as needed for anxiety (sleep).   lamoTRIgine (LAMICTAL) 150 MG tablet Take 150 mg by mouth at bedtime.   miconazole (MONISTAT 7) 2 % vaginal cream Place 1 Applicatorful vaginally at bedtime. (Patient not taking: Reported on 07/14/2021)    montelukast (SINGULAIR) 10 MG tablet Take 10 mg by mouth at bedtime as needed (Seasonal).   ondansetron (ZOFRAN-ODT) 4 MG disintegrating tablet Take 1 tablet (4 mg total) by mouth every 8 (eight) hours as needed for nausea or vomiting.   oxyCODONE-acetaminophen (PERCOCET/ROXICET) 5-325 MG tablet Take 1 tablet by mouth 2 (two) times daily as needed for moderate pain.   QUEtiapine (SEROQUEL) 100 MG tablet Take 100 mg by mouth at bedtime as needed (sleep). (Patient not taking: Reported on 07/14/2021)   sertraline (ZOLOFT) 100 MG tablet Take 100 mg by mouth at bedtime.   No facility-administered encounter medications on file as of 10/28/2021.     Lab Results  Component Value Date/Time   HGBA1C 5.9 05/20/2020 11:58 AM   HGBA1C 5.7 (A) 03/31/2019 12:45 PM   HGBA1C 5.6 09/22/2016 09:25 AM     BP Readings from Last 3 Encounters:  02/03/21 104/68  10/29/20 115/65  10/23/20 110/62      Unsuccessful attempt to reach patient. Left patient message to have all medications, supplements and any blood glucose and blood pressure readings available for review at appointment.   10/28/21  at 11:00am with Charlene Brooke, RPH,CPP   Star Rating Drugs:  Medication:  Last Fill: Day Supply  No star medications identified  Care Gaps: Annual wellness visit in last year? Yes Most Recent BP reading:104/68  73-P  02/03/21  Marjo Bicker, CPP notified  Avel Sensor, [place] Assistant [phone]  Total time spent for month CPA:

## 2021-11-28 DIAGNOSIS — G894 Chronic pain syndrome: Secondary | ICD-10-CM | POA: Diagnosis not present

## 2021-11-28 DIAGNOSIS — G8929 Other chronic pain: Secondary | ICD-10-CM | POA: Diagnosis not present

## 2021-11-28 DIAGNOSIS — M25562 Pain in left knee: Secondary | ICD-10-CM | POA: Diagnosis not present

## 2021-11-28 DIAGNOSIS — M4722 Other spondylosis with radiculopathy, cervical region: Secondary | ICD-10-CM | POA: Diagnosis not present

## 2021-11-28 DIAGNOSIS — M961 Postlaminectomy syndrome, not elsewhere classified: Secondary | ICD-10-CM | POA: Diagnosis not present

## 2021-11-28 DIAGNOSIS — M533 Sacrococcygeal disorders, not elsewhere classified: Secondary | ICD-10-CM | POA: Diagnosis not present

## 2021-11-28 DIAGNOSIS — M47816 Spondylosis without myelopathy or radiculopathy, lumbar region: Secondary | ICD-10-CM | POA: Diagnosis not present

## 2021-11-28 DIAGNOSIS — Z6827 Body mass index (BMI) 27.0-27.9, adult: Secondary | ICD-10-CM | POA: Diagnosis not present

## 2021-12-11 ENCOUNTER — Encounter: Payer: Self-pay | Admitting: Primary Care

## 2021-12-11 ENCOUNTER — Other Ambulatory Visit (HOSPITAL_COMMUNITY)
Admission: RE | Admit: 2021-12-11 | Discharge: 2021-12-11 | Disposition: A | Discharge status: Home / Self Care | Payer: PPO | Source: Ambulatory Visit | Attending: Primary Care | Admitting: Primary Care

## 2021-12-11 ENCOUNTER — Other Ambulatory Visit: Payer: Self-pay

## 2021-12-11 ENCOUNTER — Ambulatory Visit (INDEPENDENT_AMBULATORY_CARE_PROVIDER_SITE_OTHER): Payer: PPO | Admitting: Primary Care

## 2021-12-11 VITALS — BP 104/64 | HR 73 | Temp 99.2°F | Ht 63.0 in | Wt 158.0 lb

## 2021-12-11 DIAGNOSIS — R7303 Prediabetes: Secondary | ICD-10-CM

## 2021-12-11 DIAGNOSIS — F419 Anxiety disorder, unspecified: Secondary | ICD-10-CM

## 2021-12-11 DIAGNOSIS — G8929 Other chronic pain: Secondary | ICD-10-CM | POA: Diagnosis not present

## 2021-12-11 DIAGNOSIS — Z1151 Encounter for screening for human papillomavirus (HPV): Secondary | ICD-10-CM | POA: Insufficient documentation

## 2021-12-11 DIAGNOSIS — J3089 Other allergic rhinitis: Secondary | ICD-10-CM

## 2021-12-11 DIAGNOSIS — E2839 Other primary ovarian failure: Secondary | ICD-10-CM | POA: Diagnosis not present

## 2021-12-11 DIAGNOSIS — E785 Hyperlipidemia, unspecified: Secondary | ICD-10-CM | POA: Diagnosis not present

## 2021-12-11 DIAGNOSIS — Z0000 Encounter for general adult medical examination without abnormal findings: Secondary | ICD-10-CM | POA: Diagnosis not present

## 2021-12-11 DIAGNOSIS — M549 Dorsalgia, unspecified: Secondary | ICD-10-CM | POA: Diagnosis not present

## 2021-12-11 DIAGNOSIS — F32A Depression, unspecified: Secondary | ICD-10-CM | POA: Diagnosis not present

## 2021-12-11 DIAGNOSIS — Z124 Encounter for screening for malignant neoplasm of cervix: Secondary | ICD-10-CM | POA: Insufficient documentation

## 2021-12-11 DIAGNOSIS — Z1231 Encounter for screening mammogram for malignant neoplasm of breast: Secondary | ICD-10-CM | POA: Diagnosis not present

## 2021-12-11 LAB — CBC
HCT: 36.9 % (ref 36.0–46.0)
Hemoglobin: 11.9 g/dL — ABNORMAL LOW (ref 12.0–15.0)
MCHC: 32.2 g/dL (ref 30.0–36.0)
MCV: 79.5 fl (ref 78.0–100.0)
Platelets: 262 10*3/uL (ref 150.0–400.0)
RBC: 4.65 Mil/uL (ref 3.87–5.11)
RDW: 14.8 % (ref 11.5–15.5)
WBC: 6.4 10*3/uL (ref 4.0–10.5)

## 2021-12-11 LAB — COMPREHENSIVE METABOLIC PANEL
ALT: 14 U/L (ref 0–35)
AST: 21 U/L (ref 0–37)
Albumin: 4.4 g/dL (ref 3.5–5.2)
Alkaline Phosphatase: 54 U/L (ref 39–117)
BUN: 18 mg/dL (ref 6–23)
CO2: 33 mEq/L — ABNORMAL HIGH (ref 19–32)
Calcium: 9.4 mg/dL (ref 8.4–10.5)
Chloride: 103 mEq/L (ref 96–112)
Creatinine, Ser: 0.97 mg/dL (ref 0.40–1.20)
GFR: 59.3 mL/min — ABNORMAL LOW (ref >60.00)
Glucose, Bld: 86 mg/dL (ref 70–99)
Potassium: 4.4 mEq/L (ref 3.5–5.1)
Sodium: 138 mEq/L (ref 135–145)
Total Bilirubin: 0.4 mg/dL (ref 0.2–1.2)
Total Protein: 6.6 g/dL (ref 6.0–8.3)

## 2021-12-11 LAB — HEMOGLOBIN A1C: Hgb A1c MFr Bld: 6 % (ref 4.6–6.5)

## 2021-12-11 LAB — LIPID PANEL
Cholesterol: 205 mg/dL — ABNORMAL HIGH (ref 0–200)
HDL: 56.9 mg/dL (ref >39.00)
LDL Cholesterol: 126 mg/dL — ABNORMAL HIGH (ref 0–99)
NonHDL: 147.62
Total CHOL/HDL Ratio: 4
Triglycerides: 108 mg/dL (ref 0.0–149.0)
VLDL: 21.6 mg/dL (ref 0.0–40.0)

## 2021-12-11 NOTE — Assessment & Plan Note (Signed)
Controlled.  Continue Singulair 10 mg, Clarinex 5 mg, albuterol PRN.

## 2021-12-11 NOTE — Assessment & Plan Note (Signed)
Influenza vaccine provided today. Mammogram and bone density scan due, orders placed.  Patient requesting pap smear despite recommendations to discontinue after age 71. We discussed that her insurance may not cover the cost of a pap smear given her age, she would like to proceed anyway.  Colonoscopy UTD, unsure if she's due again. Cannot tell from Care Everywhere. Patient is unsure.  Discussed the importance of a healthy diet and regular exercise in order for weight loss, and to reduce the risk of further co-morbidity.  Exam today stable. Labs pending.

## 2021-12-11 NOTE — Assessment & Plan Note (Signed)
Controlled.  Continue Lamictal 150 mg daily, hydroxyzine 25 mg PRN, sertraline 100 mg.  Following with psychiatry.

## 2021-12-11 NOTE — Assessment & Plan Note (Signed)
Repeat lipid panel pending. 

## 2021-12-11 NOTE — Assessment & Plan Note (Signed)
Discussed the importance of a healthy diet and regular exercise in order for weight loss, and to reduce the risk of further co-morbidity. ? ?Repeat A1C pending. ?

## 2021-12-11 NOTE — Assessment & Plan Note (Signed)
Follows with pain management [place].  Continue Percocet 5-325 mg BID, gabapentin 600 mg TID, etodolac 500 mg daily PRN.

## 2021-12-11 NOTE — Progress Notes (Signed)
Subjective:    Patient ID: Sharon Collins, female    DOB: 28-Jan-1951, 71 y.o.   MRN: [id number]  HPI  Sharon Collins is a very pleasant 71 y.o. female who presents today for complete physical and follow up of chronic conditions.   Immunizations: -Tetanus: 2010 -Influenza: Due today -Covid-19: 3 vaccines  -Shingles: Zostavax in 2015 -Pneumonia: Prevnar 13 in 2017, Pneumovax in 2022  Diet: [place].  Exercise: Walking, chair exercises   Eye exam: Completes annually  Dental exam: Completes semi-annually   Pap: Patient requesting pap smear Mammogram: Completed in 2020 Dexa: Completed in 2020 Colonoscopy: Completed in 2022, unsure when due again.   BP Readings from Last 3 Encounters:  12/11/21 104/64  02/03/21 104/68  10/29/20 115/65      Review of Systems  Constitutional:  Negative for unexpected weight change.  HENT:  Negative for rhinorrhea.   Eyes:  Negative for visual disturbance.  Respiratory:  Negative for cough and shortness of breath.   Cardiovascular:  Negative for chest pain.  Gastrointestinal:  Negative for constipation and diarrhea.  Genitourinary:  Negative for difficulty urinating.  Musculoskeletal:  Positive for arthralgias. Negative for myalgias.  Skin:  Negative for rash.  Allergic/Immunologic: Positive for environmental allergies.  Neurological:  Negative for dizziness and headaches.  Psychiatric/Behavioral:  The patient is not nervous/anxious.         Past Medical History:  Diagnosis Date   Acute medial meniscus tear    left   Allergic rhinitis    Anxiety    Anxiety and depression    Asthma    Atrophic vaginitis    Cataracts, bilateral    Cervical radiculopathy    Degenerative disc disease, lumbar    Insomnia    Renal disorder    kidney stones   Urinary incontinence     Social History   Socioeconomic History   Marital status: Married    Spouse name: Not on file   Number of children: Not on file   Years of education: Not  on file   Highest education level: Not on file  Occupational History   Not on file  Tobacco Use   Smoking status: Never   Smokeless tobacco: Never  Vaping Use   Vaping Use: Never used  Substance and Sexual Activity   Alcohol use: No   Drug use: Yes    Comment: prescribed oxy through pain clinic for back   Sexual activity: Not on file  Other Topics Concern   Not on file  Social History Narrative   Married.   3 children, 11 grandchildren, 1 great grand child.   Retired. Worked at [employer], [place], [place], [place].   Enjoys spending time with her family.   Social Determinants of Health   Financial Resource Strain: Low Risk    Difficulty of Paying Living Expenses: Not very hard  Food Insecurity: No Food Insecurity   Worried About Charity fundraiser in the Last Year: Never true   Ran Out of Food in the Last Year: Never true  Transportation Needs: No Transportation Needs   Lack of Transportation (Medical): No   Lack of Transportation (Non-Medical): No  Physical Activity: Insufficiently Active   Days of Exercise per Week: 1 day   Minutes of Exercise per Session: 30 min  Stress: No Stress Concern Present   Feeling of Stress : Not at all  Social Connections: Moderately Integrated   Frequency of Communication with Friends and Family: More than three  times a week   Frequency of Social Gatherings with Friends and Family: Not on file   Attends Religious Services: More than 4 times per year   Active Member of [employer] or Organizations: No   Attends Music therapist: Never   Marital Status: Married  Human resources officer Violence: Not At Risk   Fear of Current or Ex-Partner: No   Emotionally Abused: No   Physically Abused: No   Sexually Abused: No    Past Surgical History:  Procedure Laterality Date   BLADDER SUSPENSION     BUNIONECTOMY     CATARACT EXTRACTION Bilateral    HAMMER TOE SURGERY     LAMINECTOMY     MANDIBLE FRACTURE SURGERY     MANDIBLE FRACTURE  SURGERY     PARTIAL HYSTERECTOMY      Family History  Problem Relation Age of Onset   Stroke Mother    Hypertension Mother    Thyroid disease Mother    Cancer Father        Brain Tumor   Hypertension Sister    Thyroid disease Sister    Thyroid disease Brother    Heart disease Brother    Heart attack Brother    Thyroid disease Brother    Depression Brother    Thyroid disease Brother    Cancer Maternal Aunt    Cancer Maternal Grandmother    Diabetes Neg Hx     Allergies  Allergen Reactions   Codeine    Hydrocodone-Acetaminophen Other (See Comments)   Hydrocodone Nausea And Vomiting    Vicodin causes vomiting, but when takes Phenergan 0.25 mg with it, she can tolerate it.   Tramadol Nausea And Vomiting    Causes vomiting & affects her heart rhythm.    Current Outpatient Medications on File Prior to Visit  Medication Sig Dispense Refill   albuterol (VENTOLIN HFA) 108 (90 Base) MCG/ACT inhaler Inhale 2 puffs into the lungs every 6 (six) hours as needed for wheezing or shortness of breath. 8 g 0   azelastine (ASTELIN) 0.1 % nasal spray Place 1 spray into both nostrils 2 (two) times daily as needed for rhinitis. 30 mL 5   bisacodyl (DULCOLAX) 10 MG suppository Place 1 suppository (10 mg total) rectally daily as needed for moderate constipation. 12 suppository 0   clindamycin-benzoyl peroxide (BENZACLIN) gel Apply topically 2 (two) times daily. 35 g 2   desloratadine (CLARINEX) 5 MG tablet Take 5 mg by mouth daily as needed (allergies).     etodolac (LODINE) 500 MG tablet TAKE 1 TABLET BY MOUTH ONCE DAILY AS NEEDED FOR PAIN 30 tablet 0   gabapentin (NEURONTIN) 600 MG tablet Take 600 mg by mouth 3 (three) times daily.     hydrOXYzine (ATARAX/VISTARIL) 25 MG tablet Take 25 mg by mouth daily as needed for anxiety (sleep).     lamoTRIgine (LAMICTAL) 150 MG tablet Take 150 mg by mouth at bedtime.     miconazole (MONISTAT 7) 2 % vaginal cream Place 1 Applicatorful vaginally at bedtime.  (Patient not taking: Reported on 07/14/2021) 45 g 0   montelukast (SINGULAIR) 10 MG tablet Take 10 mg by mouth at bedtime as needed (Seasonal).     ondansetron (ZOFRAN-ODT) 4 MG disintegrating tablet Take 1 tablet (4 mg total) by mouth every 8 (eight) hours as needed for nausea or vomiting. 90 tablet 0   oxyCODONE-acetaminophen (PERCOCET/ROXICET) 5-325 MG tablet Take 1 tablet by mouth 2 (two) times daily as needed for moderate pain.  QUEtiapine (SEROQUEL) 100 MG tablet Take 100 mg by mouth at bedtime as needed (sleep). (Patient not taking: Reported on 07/14/2021)     sertraline (ZOLOFT) 100 MG tablet Take 100 mg by mouth at bedtime.     No current facility-administered medications on file prior to visit.    BP 104/64    Pulse 73    Temp 99.2 F (37.3 C) (Oral)    Ht 5\' 3"  (1.6 m)    Wt 158 lb (71.7 kg)    LMP  (LMP Unknown)    SpO2 96%    BMI 27.99 kg/m  Objective:   Physical Exam HENT:     Right Ear: Tympanic membrane and ear canal normal.     Left Ear: Tympanic membrane and ear canal normal.     Nose: Nose normal.  Eyes:     Conjunctiva/sclera: Conjunctivae normal.     Pupils: Pupils are equal, round, and reactive to light.  Neck:     Thyroid: No thyromegaly.  Cardiovascular:     Rate and Rhythm: Normal rate and regular rhythm.     Heart sounds: No murmur heard. Pulmonary:     Effort: Pulmonary effort is normal.     Breath sounds: Normal breath sounds. No rales.  Abdominal:     General: Bowel sounds are normal.     Palpations: Abdomen is soft.     Tenderness: There is no abdominal tenderness.  Genitourinary:    Labia:        Right: No tenderness or lesion.        Left: No tenderness or lesion.      Vagina: No vaginal discharge or erythema.     Cervix: No cervical motion tenderness or discharge.  Musculoskeletal:        General: Normal range of motion.     Cervical back: Neck supple.  Lymphadenopathy:     Cervical: No cervical adenopathy.  Skin:    General: Skin is  warm and dry.     Findings: No rash.  Neurological:     Mental Status: She is alert and oriented to person, place, and time.     Cranial Nerves: No cranial nerve deficit.     Deep Tendon Reflexes: Reflexes are normal and symmetric.  Psychiatric:        Mood and Affect: Mood normal.          Assessment & Plan:      This visit occurred during the SARS-CoV-2 public health emergency.  Safety protocols were in place, including screening questions prior to the visit, additional usage of staff PPE, and extensive cleaning of exam room while observing appropriate contact time as indicated for disinfecting solutions.

## 2021-12-11 NOTE — Patient Instructions (Signed)
Stop by the lab prior to leaving today. I will notify you of your results once received.   [facility] to schedule your mammogram.   It was a pleasure to see you today!  Preventive Care 58 Years and Older, Female Preventive care refers to lifestyle choices and visits with your health care provider that can promote health and wellness. Preventive care visits are also called wellness exams. What can I expect for my preventive care visit? Counseling Your health care provider may ask you questions about your: Medical history, including: Past medical problems. Family medical history. Pregnancy and menstrual history. History of falls. Current health, including: Memory and ability to understand (cognition). Emotional well-being. Home life and relationship well-being. Sexual activity and sexual health. Lifestyle, including: Alcohol, nicotine or tobacco, and drug use. Access to firearms. Diet, exercise, and sleep habits. Work and work Statistician. Sunscreen use. Safety issues such as seatbelt and bike helmet use. Physical exam Your health care provider will check your: Height and weight. These may be used to calculate your BMI (body mass index). BMI is a measurement that tells if you are at a healthy weight. Waist circumference. This measures the distance around your waistline. This measurement also tells if you are at a healthy weight and may help predict your risk of certain diseases, such as type 2 diabetes and high blood pressure. Heart rate and blood pressure. Body temperature. Skin for abnormal spots. What immunizations do I need? Vaccines are usually given at various ages, according to a schedule. Your health care provider will recommend vaccines for you based on your age, medical history, and lifestyle or other factors, such as travel or where you work. What tests do I need? Screening Your health care provider may recommend screening tests for certain conditions. This  may include: Lipid and cholesterol levels. Hepatitis C test. Hepatitis B test. HIV (human immunodeficiency virus) test. STI (sexually transmitted infection) testing, if you are at risk. Lung cancer screening. Colorectal cancer screening. Diabetes screening. This is done by checking your blood sugar (glucose) after you have not eaten for a while (fasting). Mammogram. Talk with your health care provider about how often you should have regular mammograms. BRCA-related cancer screening. This may be done if you have a family history of breast, ovarian, tubal, or peritoneal cancers. Bone density scan. This is done to screen for osteoporosis. Talk with your health care provider about your test results, treatment options, and if necessary, the need for more tests. Follow these instructions at home: Eating and drinking  Eat a diet that includes fresh fruits and vegetables, whole grains, lean protein, and low-fat dairy products. Limit your intake of foods with high amounts of sugar, saturated fats, and salt. Take vitamin and mineral supplements as recommended by your health care provider. Do not drink alcohol if your health care provider tells you not to drink. If you drink alcohol: Limit how much you have to 0-1 drink a day. Know how much alcohol is in your drink. In the [place] one drink equals one 12 oz bottle of beer (355 mL), one 5 oz glass of wine (148 mL), or one 1 oz glass of hard liquor (44 mL). Lifestyle Brush your teeth every morning and night with fluoride toothpaste. Floss one time each day. Exercise for at least 30 minutes 5 or more days each week. Do not use any products that contain nicotine or tobacco. These products include cigarettes, chewing tobacco, and vaping devices, such as e-cigarettes. If you need help  quitting, ask your health care provider. Do not use drugs. If you are sexually active, practice safe sex. Use a condom or other form of protection in order to prevent  STIs. Take aspirin only as told by your health care provider. Make sure that you understand how much to take and what form to take. Work with your health care provider to find out whether it is safe and beneficial for you to take aspirin daily. Ask your health care provider if you need to take a cholesterol-lowering medicine (statin). Find healthy ways to manage stress, such as: Meditation, yoga, or listening to music. Journaling. Talking to a trusted person. Spending time with friends and family. Minimize exposure to UV radiation to reduce your risk of skin cancer. Safety Always wear your seat belt while driving or riding in a vehicle. Do not drive: If you have been drinking alcohol. Do not ride with someone who has been drinking. When you are tired or distracted. While texting. If you have been using any mind-altering substances or drugs. Wear a helmet and other protective equipment during sports activities. If you have firearms in your house, make sure you follow all gun safety procedures. What's next? Visit your health care provider once a year for an annual wellness visit. Ask your health care provider how often you should have your eyes and teeth checked. Stay up to date on all vaccines. This information is not intended to replace advice given to you by your health care provider. Make sure you discuss any questions you have with your health care provider. Document Revised: 04/09/2021 Document Reviewed: 04/09/2021 Elsevier Patient Education  [place].

## 2021-12-12 LAB — CYTOLOGY - PAP
Comment: NEGATIVE
Diagnosis: NEGATIVE
High risk HPV: NEGATIVE

## 2021-12-19 DIAGNOSIS — F331 Major depressive disorder, recurrent, moderate: Secondary | ICD-10-CM | POA: Diagnosis not present

## 2021-12-23 DIAGNOSIS — J301 Allergic rhinitis due to pollen: Secondary | ICD-10-CM | POA: Diagnosis not present

## 2021-12-23 DIAGNOSIS — J3089 Other allergic rhinitis: Secondary | ICD-10-CM | POA: Diagnosis not present

## 2021-12-23 DIAGNOSIS — J209 Acute bronchitis, unspecified: Secondary | ICD-10-CM | POA: Diagnosis not present

## 2021-12-23 DIAGNOSIS — R053 Chronic cough: Secondary | ICD-10-CM | POA: Diagnosis not present

## 2021-12-23 DIAGNOSIS — J3081 Allergic rhinitis due to animal (cat) (dog) hair and dander: Secondary | ICD-10-CM | POA: Diagnosis not present

## 2021-12-24 ENCOUNTER — Ambulatory Visit (INDEPENDENT_AMBULATORY_CARE_PROVIDER_SITE_OTHER): Payer: PPO | Admitting: Family Medicine

## 2021-12-24 ENCOUNTER — Encounter: Payer: Self-pay | Admitting: Family Medicine

## 2021-12-24 ENCOUNTER — Other Ambulatory Visit: Payer: Self-pay

## 2021-12-24 VITALS — BP 110/64 | HR 75 | Temp 98.3°F | Ht 63.0 in | Wt 154.5 lb

## 2021-12-24 DIAGNOSIS — M1810 Unilateral primary osteoarthritis of first carpometacarpal joint, unspecified hand: Secondary | ICD-10-CM

## 2021-12-24 DIAGNOSIS — M65312 Trigger thumb, left thumb: Secondary | ICD-10-CM | POA: Diagnosis not present

## 2021-12-24 MED ORDER — TRIAMCINOLONE ACETONIDE 40 MG/ML IJ SUSP
20.0000 mg | Freq: Once | INTRAMUSCULAR | Status: AC
  Administered 2021-12-24: 20 mg via INTRA_ARTICULAR

## 2021-12-24 NOTE — Progress Notes (Signed)
? ? ?[name] T. Eion Timbrook, MD, [facility] ?Therapist, music at [facility] ?[place] ?[place], [address] ? ?Phone: [phone]  FAX: [phone] ? ?Sharon Collins - 71 y.o. female  MRN [id number]  Date of Birth: 07/25/1951 ? ?Date: 12/24/2021  PCP: [name], NP  Referral: [name], NP ? ?Chief Complaint  ?Patient presents with  ? Hand Pain  ?  Left Thumb  ? ? ?This visit occurred during the SARS-CoV-2 public health emergency.  Safety protocols were in place, including screening questions prior to the visit, additional usage of staff PPE, and extensive cleaning of exam room while observing appropriate contact time as indicated for disinfecting solutions.  ? ?Subjective:  ? ?Sharon Collins is a 71 y.o. pleasant patient with Body mass index is 27.37 kg/m?. who presents with the following: ? ?She is a very nice young lady, and and I have saw her before with some right-sided thumb issues and a trigger finger.  Right now she has been having some intermittent left-sided thumb pain deep in the thumb from the MCP to the [facility] joint.  Does have some extensive triggering and locking up. ? ?She also has some pain at the base with movement.  She has been massaging the nodule, she thinks it has gotten a little bit better ? ?Patient Active Problem List  ? Diagnosis Date Noted  ? Cough 11/05/2020  ? Acute colitis 10/23/2020  ? Intractable pain 10/23/2020  ? Urinary incontinence 07/24/2020  ? Family history of thyroid disease 07/24/2020  ? Hyperlipidemia 07/24/2020  ? Acne 05/20/2020  ? Tibialis posterior tendinitis 11/22/2019  ? Pain medication agreement signed 07/25/2019  ? Prediabetes 03/31/2019  ? Flank pain 09/21/2018  ? Lower abdominal pain 09/21/2018  ? Preventative health care 01/18/2018  ? Anxiety and depression 11/23/2016  ? Allergic rhinitis 11/23/2016  ? Chronic back pain 11/23/2016  ? Family history of coagulation disorder 02/13/2013  ? ?Past Medical History:   ?Diagnosis Date  ? Acute medial meniscus tear   ? left  ? Allergic rhinitis   ? Anxiety   ? Anxiety and depression   ? Asthma   ? Atrophic vaginitis   ? Cataracts, bilateral   ? Cervical radiculopathy   ? Degenerative disc disease, lumbar   ? Insomnia   ? Renal disorder   ? kidney stones  ? Urinary incontinence   ? ?Past Surgical History:  ?Procedure Laterality Date  ? BLADDER SUSPENSION    ? BUNIONECTOMY    ? CATARACT EXTRACTION Bilateral   ? HAMMER TOE SURGERY    ? LAMINECTOMY    ? MANDIBLE FRACTURE SURGERY    ? MANDIBLE FRACTURE SURGERY    ? PARTIAL HYSTERECTOMY    ? ? ?Past Medical History, Surgical History, Social History, Family History, Problem List, Medications, and Allergies have been reviewed and updated if relevant. ? ? ?Review of Systems is noted in the HPI, as appropriate ? ?Objective:  ? ?BP 110/64   Pulse 75   Temp 98.3 ?F (36.8 ?C) (Temporal)   Ht 5\' 3"  (1.6 m)   Wt 154 lb 8 oz (70.1 kg)   LMP  (LMP Unknown)   SpO2 99%   BMI 27.37 kg/m?  ? ?GEN: No acute distress; alert,appropriate. ?PULM: Breathing comfortably in no respiratory distress ?PSYCH: Normally interactive.  ? ?Left hand: Digits 2 through 5 are entirely normal.  Nontender at the MCP and CMC joints as well as the wrist itself.  Nontender at  the distal radius and ulna. ? ?Left first digit: There is some tenderness at the MCP joint as well as more at the [facility] joint.  There is tenderness along the joint line and with compression with the shaft.  There is a large nodule that obviously catches and pops, and this does cause pain with movement. ? ?Laboratory and Imaging Data: ? ?Assessment and Plan:  ? ?  ICD-10-CM   ?1. Trigger thumb of left hand  M65.312 triamcinolone acetonide (KENALOG-40) injection 20 mg  ?  ?2. Localized primary osteoarthritis of carpometacarpal (CMC) joint of thumb  M18.10   ?  ? ?She does have both some arthritic change, but the primary issue is at the nodule and catching at the A1 pulley.  Acute on chronic with  exacerbation. ? ?Using an anatomical drawings, I reviewed anatomy. ? ?We discussed the pathophysiology of trigger fingers. Discussed the inflammatory nature of nodule creation and likely nodule abutting the A1 pulley system, this causing the patient's discomfort and sensations. We discussed that treatments for this include direct injection into the tendon sheath to attempt to shrink catching tissue. This can be done 1-2 times. Other treatments include surgical release. If the patient fails to trigger finger injections, I would recommend trigger finger release if the patient desires relief of the symptoms.  ? ?Tendon Sheath Injection Procedure Note ?Sharon Collins ?25-May-1951 ?Date of procedure: 12/24/2021 ? ?Procedure: Tendon Sheath Injection for Trigger Finger, L 1st ?Indications: Pain ? ?Procedure Details ?Verbal consent was obtained. Risks (including potential risk for skin lightening and potential atrophy), benefits and alternatives were discussed. Prepped with Chloraprep and Ethyl Chloride used for anesthesia. Under sterile conditions, patient injected at palmar crease aiming distally with 45 degree angle towards nodule; injected directly into tendon sheath. Medication flowed freely without resistance.  ?Needle size: 22 gauge 1 1/2 inch ?Injection: 1/2 cc of Lidocaine 1% and Kenalog 20 mg ?Medication: 1/2 cc of Kenalog 40 mg (equaling Kenalog 20 mg)  ? ?Meds ordered this encounter  ?Medications  ? triamcinolone acetonide (KENALOG-40) injection 20 mg  ? ?Medications Discontinued During This Encounter  ?Medication Reason  ? miconazole (MONISTAT 7) 2 % vaginal cream Completed Course  ? hydrOXYzine (ATARAX/VISTARIL) 25 MG tablet Completed Course  ? ?No orders of the defined types were placed in this encounter. ? ? ?Follow-up: No follow-ups on file. ? ?[employer] One speech-to-text software was used for transcription in this dictation.  Possible transcriptional errors can occur using Editor, commissioning.   ? ?Signed, ? ?Lindsey Demonte T. Adamari Frede, MD ? ? ?Outpatient Encounter Medications as of 12/24/2021  ?Medication Sig  ? albuterol (VENTOLIN HFA) 108 (90 Base) MCG/ACT inhaler Inhale 2 puffs into the lungs every 6 (six) hours as needed for wheezing or shortness of breath.  ? azelastine (ASTELIN) 0.1 % nasal spray Place 1 spray into both nostrils 2 (two) times daily as needed for rhinitis.  ? azithromycin (ZITHROMAX) 250 MG tablet Take 250 mg by mouth as directed.  ? bisacodyl (DULCOLAX) 10 MG suppository Place 1 suppository (10 mg total) rectally daily as needed for moderate constipation.  ? budesonide-formoterol (SYMBICORT) 80-4.5 MCG/ACT inhaler 2 puffs  ? clindamycin-benzoyl peroxide (BENZACLIN) gel Apply topically 2 (two) times daily.  ? desloratadine (CLARINEX) 5 MG tablet Take 5 mg by mouth daily as needed (allergies).  ? etodolac (LODINE) 500 MG tablet TAKE 1 TABLET BY MOUTH ONCE DAILY AS NEEDED FOR PAIN  ? ipratropium (ATROVENT) 0.06 % nasal spray Place into both nostrils.  ? lamoTRIgine (  LAMICTAL) 150 MG tablet Take 150 mg by mouth every morning.  ? montelukast (SINGULAIR) 10 MG tablet Take 10 mg by mouth at bedtime as needed (Seasonal).  ? ondansetron (ZOFRAN-ODT) 4 MG disintegrating tablet Take 1 tablet (4 mg total) by mouth every 8 (eight) hours as needed for nausea or vomiting.  ? oxyCODONE-acetaminophen (PERCOCET/ROXICET) 5-325 MG tablet Take 1 tablet by mouth 2 (two) times daily as needed for moderate pain.  ? sertraline (ZOLOFT) 100 MG tablet Take 100 mg by mouth in the morning.  ? gabapentin (NEURONTIN) 600 MG tablet Take 600 mg by mouth 3 (three) times daily.  ? [DISCONTINUED] hydrOXYzine (ATARAX/VISTARIL) 25 MG tablet Take 25 mg by mouth daily as needed for anxiety (sleep).  ? [DISCONTINUED] miconazole (MONISTAT 7) 2 % vaginal cream Place 1 Applicatorful vaginally at bedtime.  ? [EXPIRED] triamcinolone acetonide (KENALOG-40) injection 20 mg   ? ?No facility-administered encounter medications on file as of  12/24/2021.  ?   ?

## 2022-01-15 ENCOUNTER — Encounter: Payer: PPO | Admitting: Dermatology

## 2022-01-17 ENCOUNTER — Emergency Department: Payer: PPO

## 2022-01-17 ENCOUNTER — Emergency Department
Admission: EM | Admit: 2022-01-17 | Discharge: 2022-01-18 | Disposition: A | Discharge status: Home / Self Care | Payer: PPO | Attending: Emergency Medicine | Admitting: Emergency Medicine

## 2022-01-17 ENCOUNTER — Other Ambulatory Visit: Payer: Self-pay

## 2022-01-17 ENCOUNTER — Encounter: Payer: Self-pay | Admitting: Emergency Medicine

## 2022-01-17 DIAGNOSIS — R0789 Other chest pain: Secondary | ICD-10-CM | POA: Diagnosis not present

## 2022-01-17 DIAGNOSIS — J45909 Unspecified asthma, uncomplicated: Secondary | ICD-10-CM | POA: Diagnosis not present

## 2022-01-17 DIAGNOSIS — R079 Chest pain, unspecified: Secondary | ICD-10-CM | POA: Diagnosis not present

## 2022-01-17 LAB — CBC
HCT: 39.3 % (ref 36.0–46.0)
Hemoglobin: 12.4 g/dL (ref 12.0–15.0)
MCH: 25.7 pg — ABNORMAL LOW (ref 26.0–34.0)
MCHC: 31.6 g/dL (ref 30.0–36.0)
MCV: 81.4 fL (ref 80.0–100.0)
Platelets: 280 10*3/uL (ref 150–400)
RBC: 4.83 MIL/uL (ref 3.87–5.11)
RDW: 14.9 % (ref 11.5–15.5)
WBC: 10.8 10*3/uL — ABNORMAL HIGH (ref 4.0–10.5)
nRBC: 0 % (ref 0.0–0.2)

## 2022-01-17 LAB — HEPATIC FUNCTION PANEL
ALT: 15 U/L (ref 0–44)
AST: 21 U/L (ref 15–41)
Albumin: 4 g/dL (ref 3.5–5.0)
Alkaline Phosphatase: 61 U/L (ref 38–126)
Bilirubin, Direct: <0.1 mg/dL (ref 0.0–0.2)
Total Bilirubin: 0.6 mg/dL (ref 0.3–1.2)
Total Protein: 6.6 g/dL (ref 6.5–8.1)

## 2022-01-17 LAB — BASIC METABOLIC PANEL
Anion gap: 8 (ref 5–15)
BUN: 16 mg/dL (ref 8–23)
CO2: 28 mmol/L (ref 22–32)
Calcium: 9.7 mg/dL (ref 8.9–10.3)
Chloride: 101 mmol/L (ref 98–111)
Creatinine, Ser: 0.96 mg/dL (ref 0.44–1.00)
GFR, Estimated: >60 mL/min (ref >60)
Glucose, Bld: 100 mg/dL — ABNORMAL HIGH (ref 70–99)
Potassium: 3.7 mmol/L (ref 3.5–5.1)
Sodium: 137 mmol/L (ref 135–145)

## 2022-01-17 LAB — TROPONIN I (HIGH SENSITIVITY): Troponin I (High Sensitivity): 4 ng/L (ref <18)

## 2022-01-17 LAB — LIPASE, BLOOD: Lipase: 37 U/L (ref 11–51)

## 2022-01-17 MED ORDER — LIDOCAINE VISCOUS HCL 2 % MT SOLN
15.0000 mL | Freq: Once | OROMUCOSAL | Status: AC
  Administered 2022-01-17: 15 mL via ORAL
  Filled 2022-01-17: qty 15

## 2022-01-17 MED ORDER — IOHEXOL 350 MG/ML SOLN
75.0000 mL | Freq: Once | INTRAVENOUS | Status: AC | PRN
  Administered 2022-01-17: 50 mL via INTRAVENOUS

## 2022-01-17 MED ORDER — ALUM & MAG HYDROXIDE-SIMETH 200-200-20 MG/5ML PO SUSP
30.0000 mL | Freq: Once | ORAL | Status: AC
  Administered 2022-01-17: 30 mL via ORAL
  Filled 2022-01-17: qty 30

## 2022-01-17 NOTE — ED Provider Notes (Signed)
? ?[facility] ?Provider Note ? ? ? Event Date/Time  ? First MD Initiated Contact with Patient 01/17/22 2151   ?  (approximate) ? ?History  ? ?Chief Complaint: Chest Pain ? ?HPI ? ?Sharon Collins is a 71 y.o. female with a past medical history of asthma, anxiety, presents to the emergency department for chest pain.  According to the patient since last night she has been experiencing a burning discomfort in the center to the left side of her chest.  Patient states she has been belching but continues to feel some discomfort so she came to the emergency department for evaluation.  Denies any cardiac history.  Denies any shortness of breath does states some nausea at times but no vomiting or diaphoresis. ? ? ?Physical Exam  ? ?Triage Vital Signs: ?ED Triage Vitals  ?Enc Vitals Group  ?   BP 01/17/22 2138 131/64  ?   Pulse Rate 01/17/22 2138 83  ?   Resp --   ?   Temp 01/17/22 2138 98.8 ?F (37.1 ?C)  ?   Temp Source 01/17/22 2138 Oral  ?   SpO2 01/17/22 2138 96 %  ?   Weight 01/17/22 2134 154 lb (69.9 kg)  ?   Height 01/17/22 2134 5' 3.5" (1.613 m)  ?   Head Circumference --   ?   Peak Flow --   ?   Pain Score 01/17/22 2134 5  ?   Pain Loc --   ?   Pain Edu? --   ?   Excl. in [place]? --   ? ? ?Most recent vital signs: ?Vitals:  ? 01/17/22 2138  ?BP: 131/64  ?Pulse: 83  ?Temp: 98.8 ?F (37.1 ?C)  ?SpO2: 96%  ? ? ?General: Awake, no distress.  ?CV:  Good peripheral perfusion.  Regular rate and rhythm  ?Resp:  Normal effort.  Equal breath sounds bilaterally.  ?Abd:  No distention.  Soft, nontender.  No rebound or guarding. ? ? ? ?ED Results / Procedures / Treatments  ? ?EKG ? ?EKG viewed and interpreted by myself shows a normal sinus rhythm 81 bpm with a narrow QRS, normal axis, normal intervals, no concerning ST changes. ? ?RADIOLOGY ? ?I have personally reviewed the chest x-ray images, no acute abnormality seen on my evaluation. ?Radiology is read the chest x-ray is negative ? ? ?MEDICATIONS ORDERED IN  ED: ?Medications  ?alum & mag hydroxide-simeth (MAALOX/MYLANTA) 200-200-20 MG/5ML suspension 30 mL (has no administration in time range)  ?  And  ?lidocaine (XYLOCAINE) 2 % viscous mouth solution 15 mL (has no administration in time range)  ? ? ? ?IMPRESSION / MDM / ASSESSMENT AND PLAN / ED COURSE  ?I reviewed the triage vital signs and the nursing notes. ? ?Patient presents to the emergency department for chest discomfort and burning.  Overall the patient appears well, no distress.  Differential would include ACS, esophagitis or gastritis, reflux, pancreatitis or biliary disease.  We will check labs including cardiac enzymes LFTs and lipase.  We will treat with a GI cocktail and obtain a chest x-ray.  Patient's EKG is reassuring. ? ?Patient's lab work is reassuring.  CBC is normal.  Chemistry is normal.  Troponin is negative.  Lipase is negative.   ? ?Patient states the GI cocktail produced her anterior chest pain but she is now having some pain in her back especially with a deep inspiration.  We will obtain a CTA of the chest as a precaution.  If  CT is negative anticipate likely discharge home.  Patient care signed out to Dr. Karma Greaser.  I discussed this plan of care with the patient was agreeable. ? ?FINAL CLINICAL IMPRESSION(S) / ED DIAGNOSES  ? ?Chest pain ? ?Note:  This document was prepared using Dragon voice recognition software and may include unintentional dictation errors. ?  Harvest Dark, MD ?01/17/22 2318 ? ?

## 2022-01-17 NOTE — ED Provider Notes (Signed)
----------------------------------------- ?  11:14 PM on 01/17/2022 ?----------------------------------------- ? ?Assuming care from Dr. Kerman Passey.  In short, Sharon Collins is a 71 y.o. female with a chief complaint of chest pain.  Refer to the original H&P for additional details. ? ?The current plan of care is to follow up CTA.  If negative for acute findings, patient appropriate for discharge. ? ? ?----------------------------------------- ?12:36 AM on 01/18/2022 ?----------------------------------------- ? ?I personally reviewed the patient's CTA chest I see no evidence of acute abnormality including PE.  The radiologist does not identify any acute abnormalities.  I reassessed the patient and she was sleeping comfortably.  She understands the generally reassuring results and agrees with the plan to go home and follow-up as an outpatient.  I gave my usual and customary return precautions. ?  ?Hinda Kehr, MD ?01/18/22 0036 ? ?

## 2022-01-17 NOTE — ED Triage Notes (Signed)
Pt to ED via POV with c/o substernal chest pain, pt states yesterday woke up with "severe heartburn", pt states has taken tums and chewed an aspirin without relief. Pt states pain radiates to her back. Pt also c/o increased burping.  ?

## 2022-01-18 NOTE — Discharge Instructions (Signed)
Your workup in the Emergency Department today was reassuring.  We did not find any specific abnormalities.  We recommend you drink plenty of fluids, take your regular medications and/or any new ones prescribed today, and follow up with the doctor(s) listed in these documents as recommended.  Return to the Emergency Department if you develop new or worsening symptoms that concern you.  

## 2022-02-19 DIAGNOSIS — G894 Chronic pain syndrome: Secondary | ICD-10-CM | POA: Diagnosis not present

## 2022-02-19 DIAGNOSIS — M533 Sacrococcygeal disorders, not elsewhere classified: Secondary | ICD-10-CM | POA: Diagnosis not present

## 2022-02-19 DIAGNOSIS — M47816 Spondylosis without myelopathy or radiculopathy, lumbar region: Secondary | ICD-10-CM | POA: Diagnosis not present

## 2022-02-19 DIAGNOSIS — G8929 Other chronic pain: Secondary | ICD-10-CM | POA: Diagnosis not present

## 2022-02-19 DIAGNOSIS — M961 Postlaminectomy syndrome, not elsewhere classified: Secondary | ICD-10-CM | POA: Diagnosis not present

## 2022-02-19 DIAGNOSIS — M25562 Pain in left knee: Secondary | ICD-10-CM | POA: Diagnosis not present

## 2022-02-19 DIAGNOSIS — M4722 Other spondylosis with radiculopathy, cervical region: Secondary | ICD-10-CM | POA: Diagnosis not present

## 2022-05-25 DIAGNOSIS — M25562 Pain in left knee: Secondary | ICD-10-CM | POA: Diagnosis not present

## 2022-05-25 DIAGNOSIS — G8929 Other chronic pain: Secondary | ICD-10-CM | POA: Diagnosis not present

## 2022-05-25 DIAGNOSIS — M47816 Spondylosis without myelopathy or radiculopathy, lumbar region: Secondary | ICD-10-CM | POA: Diagnosis not present

## 2022-05-25 DIAGNOSIS — M4722 Other spondylosis with radiculopathy, cervical region: Secondary | ICD-10-CM | POA: Diagnosis not present

## 2022-05-25 DIAGNOSIS — M961 Postlaminectomy syndrome, not elsewhere classified: Secondary | ICD-10-CM | POA: Diagnosis not present

## 2022-05-25 DIAGNOSIS — G894 Chronic pain syndrome: Secondary | ICD-10-CM | POA: Diagnosis not present

## 2022-05-25 DIAGNOSIS — M533 Sacrococcygeal disorders, not elsewhere classified: Secondary | ICD-10-CM | POA: Diagnosis not present

## 2022-06-18 ENCOUNTER — Ambulatory Visit (INDEPENDENT_AMBULATORY_CARE_PROVIDER_SITE_OTHER): Payer: PPO | Admitting: Primary Care

## 2022-06-18 ENCOUNTER — Encounter: Payer: Self-pay | Admitting: Primary Care

## 2022-06-18 VITALS — BP 118/62 | HR 72 | Temp 98.6°F | Ht 63.5 in | Wt 156.0 lb

## 2022-06-18 DIAGNOSIS — Z8349 Family history of other endocrine, nutritional and metabolic diseases: Secondary | ICD-10-CM

## 2022-06-18 DIAGNOSIS — R221 Localized swelling, mass and lump, neck: Secondary | ICD-10-CM

## 2022-06-18 NOTE — Assessment & Plan Note (Addendum)
Unclear etiology.  No significant mass palpated on exam.   Given her family history of thyroid disease and an abnormal TSH result in 2021, will obtain TSH and thyroid/soft tissue neck US.  Await results.   I evaluated patient, was consulted regarding treatment, and agree with assessment and plan per Tinnie Gens, RN, DNP student.   Allie Bossier, NP-C

## 2022-06-18 NOTE — Progress Notes (Signed)
Established Patient Office Visit  Subjective   Patient ID: Sharon Collins, female    DOB: [birth year]  Age: 71 y.o. MRN: [id number]  Chief Complaint  Patient presents with   Cyst    Under chin in neck with sore throat x 1 week.     HPI  Sharon Collins "Sharon Collins" is a 71 year old female with past medical history of anxiety, depression, hyperlipidemia, prediabetes, who presents today for an acute visit.   She noticed a "knot" under her chin along with painful swallowing. Over the last few days, has noticed it has gotten smaller. She has not tried anything over the counter. Painful swallowing has resolved. Her voice is a little hoarse today, feels that it is allergy related.   She has family history of hypothyroidism.     Review of Systems  Constitutional:  Negative for chills and fever.  HENT:  Negative for sore throat.        Hoarseness  Respiratory:  Negative for shortness of breath and wheezing.   Cardiovascular:  Negative for chest pain, palpitations and orthopnea.  Gastrointestinal:  Negative for heartburn.  Musculoskeletal:  Negative for neck pain.  Neurological:  Negative for dizziness and headaches.  All other systems reviewed and are negative.     Objective:     BP 118/62   Pulse 72   Temp 98.6 F (37 C) (Oral)   Ht 5' 3.5" (1.613 m)   Wt 156 lb (70.8 kg)   LMP  (LMP Unknown)   SpO2 96%   BMI 27.20 kg/m  BP Readings from Last 3 Encounters:  06/18/22 118/62  01/18/22 130/61  12/24/21 110/64   Wt Readings from Last 3 Encounters:  06/18/22 156 lb (70.8 kg)  01/17/22 154 lb (69.9 kg)  12/24/21 154 lb 8 oz (70.1 kg)      Physical Exam Nursing note reviewed.  Constitutional:      Appearance: Normal appearance.  HENT:     Nose: No congestion or rhinorrhea.     Mouth/Throat:     Mouth: Mucous membranes are moist.  Cardiovascular:     Rate and Rhythm: Normal rate and regular rhythm.     Pulses: Normal pulses.     Heart sounds: Normal heart sounds.   Pulmonary:     Effort: Pulmonary effort is normal.     Breath sounds: Normal breath sounds.  Musculoskeletal:     Cervical back: Normal range of motion. No tenderness.  Skin:    General: Skin is warm and dry.  Neurological:     Mental Status: She is alert and oriented to person, place, and time.  Psychiatric:        Mood and Affect: Mood normal.        Behavior: Behavior normal.      No results found for any visits on 06/18/22.  The 10-year ASCVD risk score (Arnett DK, et al., 2019) is: 8.2%    Assessment & Plan:   Problem List Items Addressed This Visit       Other   Family history of thyroid disease   Relevant Orders   TSH   Neck mass - Primary    Neck mass vs thyroid nodule.  Given her family history and an abnormal TSH result in 2021, will TSH today.   Will order ultrasound of neck and thyroid.       Relevant Orders   US Soft Tissue Head/Neck (NON-THYROID)   US THYROID    No follow-ups  on file.    Tinnie Gens, BSN-RN, DNP STUDENT

## 2022-06-18 NOTE — Patient Instructions (Addendum)
Stop by the lab prior to leaving today. I will notify you of your results once received.    You will receive a phone call regarding the ultrasound referral.   We will be in touch with the results.   It was a pleasure to see you today!

## 2022-06-18 NOTE — Progress Notes (Signed)
Subjective:    Patient ID: Sharon Collins, female    DOB: 28-Jul-1951, 71 y.o.   MRN: [id number]  HPI  [name] is a very pleasant 71 y.o. female with a history of allergic rhinitis, acute colitis, anxiety and depression who presents today to discuss neck mass.   Her mass is located to the upper anterior neck for which she initially noticed one week ago. At the time she also noticed painful swallowing. Over the last 2-3 days she's noticed the mass has decreased in size and her painful swallowing has resolved.   She has a significant family history of thyroid disease in all immediate family members. Her brother had severe hyperthyroidism requiring immediate surgery. She denies a family history of thyroid cancer.  She denies choking, throat tightness, recent cough/cold symptoms, increased post nasal drip. She is a non smoker.  Review of Systems  HENT:  Negative for sore throat and trouble swallowing.        Neck mass  Respiratory:  Negative for shortness of breath.          Past Medical History:  Diagnosis Date   Acute medial meniscus tear    left   Allergic rhinitis    Anxiety    Anxiety and depression    Asthma    Atrophic vaginitis    Cataracts, bilateral    Cervical radiculopathy    Degenerative disc disease, lumbar    Insomnia    Renal disorder    kidney stones   Urinary incontinence     Social History   Socioeconomic History   Marital status: Married    Spouse name: Not on file   Number of children: Not on file   Years of education: Not on file   Highest education level: Not on file  Occupational History   Not on file  Tobacco Use   Smoking status: Never   Smokeless tobacco: Never  Vaping Use   Vaping Use: Never used  Substance and Sexual Activity   Alcohol use: No   Drug use: Yes    Comment: prescribed oxy through pain clinic for back   Sexual activity: Not on file  Other Topics Concern   Not on file  Social History Narrative   Married.    3 children, 11 grandchildren, 1 great grand child.   Retired. Worked at [employer], [place], [place], [place].   Enjoys spending time with her family.   Social Determinants of Health   Financial Resource Strain: Low Risk  (07/14/2021)   Overall Financial Resource Strain (CARDIA)    Difficulty of Paying Living Expenses: Not very hard  Food Insecurity: No Food Insecurity (07/14/2021)   Hunger Vital Sign    Worried About Running Out of Food in the Last Year: Never true    Ran Out of Food in the Last Year: Never true  Transportation Needs: No Transportation Needs (07/14/2021)   PRAPARE - Hydrologist (Medical): No    Lack of Transportation (Non-Medical): No  Physical Activity: Insufficiently Active (07/14/2021)   Exercise Vital Sign    Days of Exercise per Week: 1 day    Minutes of Exercise per Session: 30 min  Stress: No Stress Concern Present (07/14/2021)   [place]    Feeling of Stress : Not at all  Social Connections: Moderately Integrated (07/14/2021)   Social Connection and Isolation Panel [NHANES]    Frequency of Communication with  Friends and Family: More than three times a week    Frequency of Social Gatherings with Friends and Family: Not on file    Attends Religious Services: More than 4 times per year    Active Member of [employer] or Organizations: No    Attends Archivist Meetings: Never    Marital Status: Married  Human resources officer Violence: Not At Risk (07/14/2021)   Humiliation, Afraid, Rape, and Kick questionnaire    Fear of Current or Ex-Partner: No    Emotionally Abused: No    Physically Abused: No    Sexually Abused: No    Past Surgical History:  Procedure Laterality Date   BLADDER SUSPENSION     BUNIONECTOMY     CATARACT EXTRACTION Bilateral    HAMMER TOE SURGERY     LAMINECTOMY     MANDIBLE FRACTURE SURGERY     MANDIBLE FRACTURE SURGERY     PARTIAL  HYSTERECTOMY      Family History  Problem Relation Age of Onset   Stroke Mother    Hypertension Mother    Thyroid disease Mother    Cancer Father        Brain Tumor   Hypertension Sister    Thyroid disease Sister    Thyroid disease Brother    Heart disease Brother    Heart attack Brother    Thyroid disease Brother    Depression Brother    Thyroid disease Brother    Cancer Maternal Aunt    Cancer Maternal Grandmother    Diabetes Neg Hx     Allergies  Allergen Reactions   Codeine    Hydrocodone-Acetaminophen Other (See Comments)   Hydrocodone Nausea And Vomiting    Vicodin causes vomiting, but when takes Phenergan 0.25 mg with it, she can tolerate it.   Tramadol Nausea And Vomiting    Causes vomiting & affects her heart rhythm.    Current Outpatient Medications on File Prior to Visit  Medication Sig Dispense Refill   albuterol (VENTOLIN HFA) 108 (90 Base) MCG/ACT inhaler Inhale 2 puffs into the lungs every 6 (six) hours as needed for wheezing or shortness of breath. 8 g 0   azelastine (ASTELIN) 0.1 % nasal spray Place 1 spray into both nostrils 2 (two) times daily as needed for rhinitis. 30 mL 5   baclofen (LIORESAL) 10 MG tablet TAKE 1/2 TO 1 (ONE-HALF TO ONE) TABLET BY MOUTH TWICE DAILY     bisacodyl (DULCOLAX) 10 MG suppository Place 1 suppository (10 mg total) rectally daily as needed for moderate constipation. 12 suppository 0   budesonide-formoterol (SYMBICORT) 80-4.5 MCG/ACT inhaler 2 puffs     desloratadine (CLARINEX) 5 MG tablet Take 5 mg by mouth daily as needed (allergies).     gabapentin (NEURONTIN) 600 MG tablet Take 600 mg by mouth 3 (three) times daily.     ipratropium (ATROVENT) 0.06 % nasal spray Place into both nostrils.     lamoTRIgine (LAMICTAL) 150 MG tablet Take 150 mg by mouth every morning.     montelukast (SINGULAIR) 10 MG tablet Take 10 mg by mouth at bedtime as needed (Seasonal).     ondansetron (ZOFRAN-ODT) 4 MG disintegrating tablet Take 1  tablet (4 mg total) by mouth every 8 (eight) hours as needed for nausea or vomiting. 90 tablet 0   oxyCODONE-acetaminophen (PERCOCET/ROXICET) 5-325 MG tablet Take 1 tablet by mouth 2 (two) times daily as needed for moderate pain.     sertraline (ZOLOFT) 100 MG tablet Take 100 mg by  mouth in the morning.     clindamycin-benzoyl peroxide (BENZACLIN) gel Apply topically 2 (two) times daily. (Patient not taking: Reported on 06/18/2022) 35 g 2   No current facility-administered medications on file prior to visit.    BP 118/62   Pulse 72   Temp 98.6 F (37 C) (Oral)   Ht 5' 3.5" (1.613 m)   Wt 156 lb (70.8 kg)   LMP  (LMP Unknown)   SpO2 96%   BMI 27.20 kg/m  Objective:   Physical Exam Neck:     Thyroid: No thyromegaly or thyroid tenderness.     Comments: Firm knot like mass noted to anterior upper neck at top of larynx.  Cardiovascular:     Rate and Rhythm: Normal rate.  Pulmonary:     Effort: Pulmonary effort is normal.  Musculoskeletal:     Cervical back: Normal range of motion.  Lymphadenopathy:     Cervical: No cervical adenopathy.  Skin:    General: Skin is warm and dry.  Neurological:     Mental Status: She is alert.           Assessment & Plan:   Problem List Items Addressed This Visit       Other   Family history of thyroid disease   Relevant Orders   TSH   Neck mass - Primary    Unclear etiology.  No significant mass palpated on exam.   Given her family history of thyroid disease and an abnormal TSH result in 2021, will obtain TSH and thyroid/soft tissue neck US.  Await results.       Relevant Orders   US Soft Tissue Head/Neck (NON-THYROID)   US THYROID       Daylee Delahoz K Azalia Neuberger, NP

## 2022-06-19 LAB — TSH: TSH: 2.34 u[IU]/mL (ref 0.35–5.50)

## 2022-07-14 ENCOUNTER — Ambulatory Visit: Payer: PPO | Attending: Primary Care

## 2022-07-14 ENCOUNTER — Ambulatory Visit: Payer: PPO

## 2022-08-11 ENCOUNTER — Telehealth: Payer: Self-pay | Admitting: Primary Care

## 2022-08-11 NOTE — Telephone Encounter (Signed)
Patient called in about her appointment today at 1130,she did not receive a phone call. She would like a phone call to reschedule.

## 2022-08-12 ENCOUNTER — Ambulatory Visit: Payer: PPO

## 2022-08-12 ENCOUNTER — Telehealth: Payer: Self-pay | Admitting: Primary Care

## 2022-08-12 NOTE — Telephone Encounter (Signed)
LVM for pt to rtn my call to re-schedule AWV /with NHA on  08/11/22 call back # [phone]

## 2022-08-12 NOTE — Telephone Encounter (Signed)
LVM for pt to rtn my call to re-schedule AWV /with NHA on 08/11/22 call back # [phone]

## 2022-08-13 DIAGNOSIS — M961 Postlaminectomy syndrome, not elsewhere classified: Secondary | ICD-10-CM | POA: Diagnosis not present

## 2022-08-13 DIAGNOSIS — M533 Sacrococcygeal disorders, not elsewhere classified: Secondary | ICD-10-CM | POA: Diagnosis not present

## 2022-08-13 DIAGNOSIS — G894 Chronic pain syndrome: Secondary | ICD-10-CM | POA: Diagnosis not present

## 2022-08-13 DIAGNOSIS — M25562 Pain in left knee: Secondary | ICD-10-CM | POA: Diagnosis not present

## 2022-08-13 DIAGNOSIS — M4722 Other spondylosis with radiculopathy, cervical region: Secondary | ICD-10-CM | POA: Diagnosis not present

## 2022-08-13 DIAGNOSIS — Z0289 Encounter for other administrative examinations: Secondary | ICD-10-CM | POA: Diagnosis not present

## 2022-08-13 DIAGNOSIS — M47816 Spondylosis without myelopathy or radiculopathy, lumbar region: Secondary | ICD-10-CM | POA: Diagnosis not present

## 2022-08-13 DIAGNOSIS — G8929 Other chronic pain: Secondary | ICD-10-CM | POA: Diagnosis not present

## 2022-08-18 DIAGNOSIS — J301 Allergic rhinitis due to pollen: Secondary | ICD-10-CM | POA: Diagnosis not present

## 2022-08-18 DIAGNOSIS — R053 Chronic cough: Secondary | ICD-10-CM | POA: Diagnosis not present

## 2022-08-18 DIAGNOSIS — J3081 Allergic rhinitis due to animal (cat) (dog) hair and dander: Secondary | ICD-10-CM | POA: Diagnosis not present

## 2022-08-18 DIAGNOSIS — J3089 Other allergic rhinitis: Secondary | ICD-10-CM | POA: Diagnosis not present

## 2022-08-21 NOTE — Telephone Encounter (Signed)
AWV completed °

## 2022-09-15 ENCOUNTER — Telehealth: Payer: Self-pay

## 2022-09-15 NOTE — Chronic Care Management (AMB) (Signed)
Left a message to inform the patient of one more Breast Screening Clinic in December for the 2023 year. Reminded to contact me if any interest .  Charlene Brooke, CPP notified  Avel Sensor, [place]  [phone]

## 2022-10-08 DIAGNOSIS — M170 Bilateral primary osteoarthritis of knee: Secondary | ICD-10-CM | POA: Diagnosis not present

## 2022-10-08 DIAGNOSIS — M1712 Unilateral primary osteoarthritis, left knee: Secondary | ICD-10-CM | POA: Diagnosis not present

## 2022-10-08 DIAGNOSIS — M1711 Unilateral primary osteoarthritis, right knee: Secondary | ICD-10-CM | POA: Diagnosis not present

## 2022-10-08 DIAGNOSIS — M25562 Pain in left knee: Secondary | ICD-10-CM | POA: Diagnosis not present

## 2022-10-08 DIAGNOSIS — M25561 Pain in right knee: Secondary | ICD-10-CM | POA: Diagnosis not present

## 2022-10-15 ENCOUNTER — Ambulatory Visit (INDEPENDENT_AMBULATORY_CARE_PROVIDER_SITE_OTHER): Payer: PPO | Admitting: Family Medicine

## 2022-10-15 ENCOUNTER — Encounter: Payer: Self-pay | Admitting: Family Medicine

## 2022-10-15 VITALS — BP 110/68 | HR 81 | Temp 98.1°F | Ht 63.5 in | Wt 157.0 lb

## 2022-10-15 DIAGNOSIS — M545 Low back pain, unspecified: Secondary | ICD-10-CM | POA: Insufficient documentation

## 2022-10-15 LAB — POC URINALSYSI DIPSTICK (AUTOMATED)
Bilirubin, UA: NEGATIVE
Blood, UA: NEGATIVE
Glucose, UA: NEGATIVE
Ketones, UA: NEGATIVE
Leukocytes, UA: NEGATIVE
Nitrite, UA: NEGATIVE
Protein, UA: NEGATIVE
Spec Grav, UA: 1.02 (ref 1.010–1.025)
Urobilinogen, UA: 0.2 E.U./dL
pH, UA: 5.5 (ref 5.0–8.0)

## 2022-10-15 NOTE — Patient Instructions (Signed)
Push fluids. Can try cranberry tablets.  Avoid NSAIDs like etodolac.

## 2022-10-15 NOTE — Progress Notes (Signed)
Patient ID: [name], female    DOB: Aug 24, 1951, 71 y.o.   MRN: [id number]  This visit was conducted in person.  BP 110/68   Pulse 81   Temp 98.1 F (36.7 C) (Oral)   Ht 5' 3.5" (1.613 m)   Wt 157 lb (71.2 kg)   LMP  (LMP Unknown)   SpO2 96%   BMI 27.38 kg/m    CC:  Chief Complaint  Patient presents with   Back Pain    Low    Subjective:   HPI: AUDRIANNA DRISKILL is a 71 y.o. female  patient of Allie Bossier , NP presenting on 10/15/2022 for Back Pain (Low)  She reports new onset low back pain in last 2 weeks.  She takes care of her mother in law.  No dysuria, no frequency, no urgency.   Good water intake. Drinks caffeine 9 tea and tea)  No  new meds, no change in diet.   History of recurrent and frequency UTI She does have history of DDD.   Using oxy and gabapentin for chronic back and knee pain.   Relevant past medical, surgical, family and social history reviewed and updated as indicated. Interim medical history since our last visit reviewed. Allergies and medications reviewed and updated. Outpatient Medications Prior to Visit  Medication Sig Dispense Refill   albuterol (VENTOLIN HFA) 108 (90 Base) MCG/ACT inhaler Inhale 2 puffs into the lungs every 6 (six) hours as needed for wheezing or shortness of breath. 8 g 0   azelastine (ASTELIN) 0.1 % nasal spray Place 1 spray into both nostrils 2 (two) times daily as needed for rhinitis. 30 mL 5   baclofen (LIORESAL) 10 MG tablet TAKE 1/2 TO 1 (ONE-HALF TO ONE) TABLET BY MOUTH TWICE DAILY     bisacodyl (DULCOLAX) 10 MG suppository Place 1 suppository (10 mg total) rectally daily as needed for moderate constipation. 12 suppository 0   budesonide-formoterol (SYMBICORT) 80-4.5 MCG/ACT inhaler 2 puffs     desloratadine (CLARINEX) 5 MG tablet Take 5 mg by mouth daily as needed (allergies).     ipratropium (ATROVENT) 0.06 % nasal spray Place into both nostrils.     lamoTRIgine (LAMICTAL) 150 MG tablet Take 150 mg by mouth  every morning.     montelukast (SINGULAIR) 10 MG tablet Take 10 mg by mouth at bedtime as needed (Seasonal).     ondansetron (ZOFRAN-ODT) 4 MG disintegrating tablet Take 1 tablet (4 mg total) by mouth every 8 (eight) hours as needed for nausea or vomiting. 90 tablet 0   oxyCODONE-acetaminophen (PERCOCET/ROXICET) 5-325 MG tablet Take 1 tablet by mouth 2 (two) times daily as needed for moderate pain.     sertraline (ZOLOFT) 100 MG tablet Take 100 mg by mouth in the morning.     gabapentin (NEURONTIN) 600 MG tablet Take 600 mg by mouth 3 (three) times daily.     clindamycin-benzoyl peroxide (BENZACLIN) gel Apply topically 2 (two) times daily. (Patient not taking: Reported on 06/18/2022) 35 g 2   No facility-administered medications prior to visit.     Per HPI unless specifically indicated in ROS section below Review of Systems  Constitutional:  Negative for fatigue and fever.  HENT:  Negative for congestion.   Eyes:  Negative for pain.  Respiratory:  Negative for cough and shortness of breath.   Cardiovascular:  Negative for chest pain, palpitations and leg swelling.  Gastrointestinal:  Negative for abdominal pain.  Genitourinary:  Negative for dysuria and vaginal  bleeding.  Musculoskeletal:  Positive for back pain.  Neurological:  Negative for syncope, light-headedness and headaches.  Psychiatric/Behavioral:  Negative for dysphoric mood.    Objective:  BP 110/68   Pulse 81   Temp 98.1 F (36.7 C) (Oral)   Ht 5' 3.5" (1.613 m)   Wt 157 lb (71.2 kg)   LMP  (LMP Unknown)   SpO2 96%   BMI 27.38 kg/m   Wt Readings from Last 3 Encounters:  10/15/22 157 lb (71.2 kg)  06/18/22 156 lb (70.8 kg)  01/17/22 154 lb (69.9 kg)      Physical Exam Constitutional:      General: She is not in acute distress.    Appearance: Normal appearance. She is well-developed. She is not ill-appearing or toxic-appearing.  HENT:     Head: Normocephalic.     Right Ear: Hearing, tympanic membrane, ear canal  and external ear normal. Tympanic membrane is not erythematous, retracted or bulging.     Left Ear: Hearing, tympanic membrane, ear canal and external ear normal. Tympanic membrane is not erythematous, retracted or bulging.     Nose: No mucosal edema or rhinorrhea.     Right Sinus: No maxillary sinus tenderness or frontal sinus tenderness.     Left Sinus: No maxillary sinus tenderness or frontal sinus tenderness.     Mouth/Throat:     Pharynx: Uvula midline.  Eyes:     General: Lids are normal. Lids are everted, no foreign bodies appreciated.     Conjunctiva/sclera: Conjunctivae normal.     Pupils: Pupils are equal, round, and reactive to light.  Neck:     Thyroid: No thyroid mass or thyromegaly.     Vascular: No carotid bruit.     Trachea: Trachea normal.  Cardiovascular:     Rate and Rhythm: Normal rate and regular rhythm.     Pulses: Normal pulses.     Heart sounds: Normal heart sounds, S1 normal and S2 normal. No murmur heard.    No friction rub. No gallop.  Pulmonary:     Effort: Pulmonary effort is normal. No tachypnea or respiratory distress.     Breath sounds: Normal breath sounds. No decreased breath sounds, wheezing, rhonchi or rales.  Abdominal:     General: Bowel sounds are normal.     Palpations: Abdomen is soft.     Tenderness: There is no abdominal tenderness.  Musculoskeletal:     Cervical back: Normal range of motion and neck supple.  Skin:    General: Skin is warm and dry.     Findings: No rash.  Neurological:     Mental Status: She is alert.  Psychiatric:        Mood and Affect: Mood is not anxious or depressed.        Speech: Speech normal.        Behavior: Behavior normal. Behavior is cooperative.        Thought Content: Thought content normal.        Judgment: Judgment normal.       Results for orders placed or performed in visit on 06/18/22  TSH  Result Value Ref Range   TSH 2.34 0.35 - 5.50 uIU/mL     COVID 19 screen:  No recent travel or  known exposure to [place] The patient denies respiratory symptoms of COVID 19 at this time. The importance of social distancing was discussed today.   Assessment and Plan    Problem List Items Addressed This Visit  Acute bilateral low back pain without sciatica - Primary    Acute, no clear sign of urinary tract infection, pyelo or kidney stone per urinalysis. Most likely musculoskeletal strain of low back or pain secondary to chronic degenerative disc disease versus referred bladder pain from bladder irritation.  I have encouraged her to stop etodolac and any NSAIDs, push fluids today irrigate bladder, start cranberry and call if symptoms are not improving or progressing.      Relevant Orders   POCT Urinalysis Dipstick (Automated) (Completed)     Eliezer Lofts, MD

## 2022-10-15 NOTE — Assessment & Plan Note (Addendum)
Acute, no clear sign of urinary tract infection, pyelo or kidney stone per urinalysis. Most likely musculoskeletal strain of low back or pain secondary to chronic degenerative disc disease versus referred bladder pain from bladder irritation.  I have encouraged her to stop etodolac and any NSAIDs, push fluids today irrigate bladder, start cranberry and call if symptoms are not improving or progressing. Reviewed foods and drinks that she should avoid to avoid bladder irritation.  Recommend decreasing caffeine intake.

## 2022-10-30 ENCOUNTER — Telehealth: Payer: Self-pay | Admitting: Primary Care

## 2022-10-30 NOTE — Telephone Encounter (Signed)
LVM for pt to rtn my call to schedule AWV with [facility] call back # [phone] 

## 2022-11-12 DIAGNOSIS — G8929 Other chronic pain: Secondary | ICD-10-CM | POA: Diagnosis not present

## 2022-11-12 DIAGNOSIS — M47816 Spondylosis without myelopathy or radiculopathy, lumbar region: Secondary | ICD-10-CM | POA: Diagnosis not present

## 2022-11-12 DIAGNOSIS — M533 Sacrococcygeal disorders, not elsewhere classified: Secondary | ICD-10-CM | POA: Diagnosis not present

## 2022-11-12 DIAGNOSIS — M961 Postlaminectomy syndrome, not elsewhere classified: Secondary | ICD-10-CM | POA: Diagnosis not present

## 2022-11-12 DIAGNOSIS — M25562 Pain in left knee: Secondary | ICD-10-CM | POA: Diagnosis not present

## 2022-11-12 DIAGNOSIS — M4722 Other spondylosis with radiculopathy, cervical region: Secondary | ICD-10-CM | POA: Diagnosis not present

## 2022-11-12 DIAGNOSIS — G894 Chronic pain syndrome: Secondary | ICD-10-CM | POA: Diagnosis not present

## 2022-12-01 ENCOUNTER — Ambulatory Visit (INDEPENDENT_AMBULATORY_CARE_PROVIDER_SITE_OTHER): Payer: PPO | Admitting: Family Medicine

## 2022-12-01 ENCOUNTER — Encounter: Payer: Self-pay | Admitting: Family Medicine

## 2022-12-01 VITALS — BP 120/60 | HR 77 | Temp 98.2°F | Ht 62.5 in | Wt 158.1 lb

## 2022-12-01 DIAGNOSIS — J0100 Acute maxillary sinusitis, unspecified: Secondary | ICD-10-CM | POA: Insufficient documentation

## 2022-12-01 HISTORY — DX: Acute maxillary sinusitis, unspecified: J01.00

## 2022-12-01 MED ORDER — AZITHROMYCIN 250 MG PO TABS: ORAL_TABLET | ORAL | 0 refills | Status: DC

## 2022-12-01 NOTE — Patient Instructions (Signed)
Complete course of azithromycin.  Continue allergy medication regimen.  Can use saline drops in eyes to soothe. Tylenol for headache or face pain.

## 2022-12-01 NOTE — Assessment & Plan Note (Signed)
Acute. Given ongoing .7 days  likely viral infection ( possible COVID but pt refused test) with bacterial super-infeciton.  Continue allergy regimen.  Return and ER precautions provided.

## 2022-12-01 NOTE — Progress Notes (Signed)
Patient ID: Sharon Collins, female    DOB: 03-12-1951, 72 y.o.   MRN: [id number]  This visit was conducted in person.  BP 120/60   Pulse 77   Temp 98.2 F (36.8 C) (Temporal)   Ht 5' 2.5" (1.588 m)   Wt 158 lb 2 oz (71.7 kg)   LMP  (LMP Unknown)   SpO2 98%   BMI 28.46 kg/m    CC:  Chief Complaint  Patient presents with   Nasal Congestion   Ear Pain   Cough   Sinus Drainage   Conjunctivitis    Subjective:   HPI: Sharon Collins is a 72 y.o. female patient of [name], NP with histor y of allergies presenting on 12/01/2022 for Nasal Congestion, Ear Pain, Cough, Sinus Drainage, and Conjunctivitis   Date of onset:  7 days Initial symptoms included  fever in first  days, nasal discharge Symptoms progressed to green copius thick chunky nasal discharge.  Bilateral face pain. Decreased taste. Pain in right ear, red weepy eyes. Productive cough from post nasal drip  Soreness from cough in lower chest wall Cough not keeping her up at night. No ST., clarinex  No need for albuterol.  Sick contacts:  granddaughter COVID testing:   none    She has tried to treat with  swimmers ear drops. Astelin, singulair  Oxycodone at night for back pain.     No history of chronic lung disease such   as COPD.  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 Non-smoker.    No antibiotics in last 30 days. She states PCN does not help her.   Relevant past medical, surgical, family and social history reviewed and updated as indicated. Interim medical history since our last visit reviewed. Allergies and medications reviewed and updated. Outpatient Medications Prior to Visit  Medication Sig Dispense Refill   albuterol (VENTOLIN HFA) 108 (90 Base) MCG/ACT inhaler Inhale 2 puffs into the lungs every 6 (six) hours as needed for wheezing or shortness of breath. 8 g 0   azelastine (ASTELIN) 0.1 % nasal spray Place 1 spray into both nostrils 2 (two) times daily as needed for rhinitis. 30 mL 5    baclofen (LIORESAL) 10 MG tablet TAKE 1/2 TO 1 (ONE-HALF TO ONE) TABLET BY MOUTH TWICE DAILY     bisacodyl (DULCOLAX) 10 MG suppository Place 1 suppository (10 mg total) rectally daily as needed for moderate constipation. 12 suppository 0   budesonide-formoterol (SYMBICORT) 80-4.5 MCG/ACT inhaler 2 puffs     desloratadine (CLARINEX) 5 MG tablet Take 5 mg by mouth daily as needed (allergies).     gabapentin (NEURONTIN) 600 MG tablet Take 600 mg by mouth 3 (three) times daily.     ipratropium (ATROVENT) 0.06 % nasal spray Place into both nostrils.     lamoTRIgine (LAMICTAL) 150 MG tablet Take 150 mg by mouth every morning.     montelukast (SINGULAIR) 10 MG tablet Take 10 mg by mouth at bedtime as needed (Seasonal).     ondansetron (ZOFRAN-ODT) 4 MG disintegrating tablet Take 1 tablet (4 mg total) by mouth every 8 (eight) hours as needed for nausea or vomiting. 90 tablet 0   oxyCODONE-acetaminophen (PERCOCET/ROXICET) 5-325 MG tablet Take 1 tablet by mouth 2 (two) times daily as needed for moderate pain.     sertraline (ZOLOFT) 100 MG tablet Take 100 mg by mouth in the morning.     No facility-administered medications prior to visit.     Per HPI unless  specifically indicated in ROS section below Review of Systems  Constitutional:  Positive for fever. Negative for fatigue.  HENT:  Positive for ear pain, rhinorrhea, sinus pressure and sinus pain. Negative for congestion.   Eyes:  Negative for pain.  Respiratory:  Positive for cough. Negative for shortness of breath.   Cardiovascular:  Negative for chest pain, palpitations and leg swelling.  Gastrointestinal:  Negative for abdominal pain.  Genitourinary:  Negative for dysuria and vaginal bleeding.  Musculoskeletal:  Negative for back pain.  Neurological:  Negative for syncope, light-headedness and headaches.  Psychiatric/Behavioral:  Negative for dysphoric mood.    Objective:  BP 120/60   Pulse 77   Temp 98.2 F (36.8 C) (Temporal)   Ht 5'  2.5" (1.588 m)   Wt 158 lb 2 oz (71.7 kg)   LMP  (LMP Unknown)   SpO2 98%   BMI 28.46 kg/m   Wt Readings from Last 3 Encounters:  12/01/22 158 lb 2 oz (71.7 kg)  10/15/22 157 lb (71.2 kg)  06/18/22 156 lb (70.8 kg)      Physical Exam Constitutional:      General: She is not in acute distress.    Appearance: Normal appearance. She is well-developed. She is not ill-appearing or toxic-appearing.  HENT:     Head: Normocephalic.     Right Ear: Hearing, ear canal and external ear normal. Tympanic membrane is erythematous. Tympanic membrane is not retracted or bulging.     Left Ear: Hearing, tympanic membrane, ear canal and external ear normal. Tympanic membrane is not erythematous, retracted or bulging.     Nose: No mucosal edema or rhinorrhea.     Right Turbinates: Swollen.     Left Turbinates: Swollen.     Right Sinus: Maxillary sinus tenderness and frontal sinus tenderness present.     Left Sinus: Maxillary sinus tenderness and frontal sinus tenderness present.     Mouth/Throat:     Pharynx: Uvula midline.  Eyes:     General: Lids are normal. Lids are everted, no foreign bodies appreciated.     Conjunctiva/sclera: Conjunctivae normal.     Pupils: Pupils are equal, round, and reactive to light.  Neck:     Thyroid: No thyroid mass or thyromegaly.     Vascular: No carotid bruit.     Trachea: Trachea normal.  Cardiovascular:     Rate and Rhythm: Normal rate and regular rhythm.     Pulses: Normal pulses.     Heart sounds: Normal heart sounds, S1 normal and S2 normal. No murmur heard.    No friction rub. No gallop.  Pulmonary:     Effort: Pulmonary effort is normal. No tachypnea or respiratory distress.     Breath sounds: Normal breath sounds. No decreased breath sounds, wheezing, rhonchi or rales.  Abdominal:     General: Bowel sounds are normal.     Palpations: Abdomen is soft.     Tenderness: There is no abdominal tenderness.  Musculoskeletal:     Cervical back: Normal range  of motion and neck supple.  Skin:    General: Skin is warm and dry.     Findings: No rash.  Neurological:     Mental Status: She is alert.  Psychiatric:        Mood and Affect: Mood is not anxious or depressed.        Speech: Speech normal.        Behavior: Behavior normal. Behavior is cooperative.  Thought Content: Thought content normal.        Judgment: Judgment normal.       Results for orders placed or performed in visit on 10/15/22  POCT Urinalysis Dipstick (Automated)  Result Value Ref Range   Color, UA Yellow    Clarity, UA Clear    Glucose, UA Negative Negative   Bilirubin, UA Negative    Ketones, UA Negative    Spec Grav, UA 1.020 1.010 - 1.025   Blood, UA Negative    pH, UA 5.5 5.0 - 8.0   Protein, UA Negative Negative   Urobilinogen, UA 0.2 0.2 or 1.0 E.U./dL   Nitrite, UA Negative    Leukocytes, UA Negative Negative    Assessment and Plan  Acute non-recurrent maxillary sinusitis Assessment & Plan: Acute. Given ongoing .7 days  likely viral infection ( possible COVID but pt refused test) with bacterial super-infeciton.  Continue allergy regimen.  Return and ER precautions provided.   Other orders -     Azithromycin; 2 tab po x 1 day then 1 tab po daily  Dispense: 6 tablet; Refill: 0    No follow-ups on file.   Eliezer Lofts, MD

## 2022-12-14 ENCOUNTER — Ambulatory Visit: Payer: PPO | Admitting: Internal Medicine

## 2022-12-14 ENCOUNTER — Ambulatory Visit (INDEPENDENT_AMBULATORY_CARE_PROVIDER_SITE_OTHER): Payer: PPO | Admitting: Internal Medicine

## 2022-12-14 ENCOUNTER — Encounter: Payer: Self-pay | Admitting: Internal Medicine

## 2022-12-14 VITALS — BP 110/70 | HR 58 | Temp 97.4°F | Ht 63.9 in | Wt 157.0 lb

## 2022-12-14 DIAGNOSIS — H6591 Unspecified nonsuppurative otitis media, right ear: Secondary | ICD-10-CM | POA: Insufficient documentation

## 2022-12-14 DIAGNOSIS — H6501 Acute serous otitis media, right ear: Secondary | ICD-10-CM | POA: Diagnosis not present

## 2022-12-14 MED ORDER — PREDNISONE 20 MG PO TABS: 40.0000 mg | ORAL_TABLET | Freq: Every day | ORAL | 0 refills | Status: DC

## 2022-12-14 NOTE — Progress Notes (Signed)
Subjective:    Patient ID: Sharon Collins, female    DOB: 30-May-1951, 72 y.o.   MRN: [id number]  HPI Here due to right ear fullness  Here 2/6---diagnosed with secondary bacterial infection and given z-pak Still has feeling of right ear feeling clogged Head feels closed in when she talks---hearing is off Rest of symptoms improved---sinus pressure and green stuff on right Still coughing though--especially at night No fever No tinnitus--just the decreased hearing  Current Outpatient Medications on File Prior to Visit  Medication Sig Dispense Refill   albuterol (VENTOLIN HFA) 108 (90 Base) MCG/ACT inhaler Inhale 2 puffs into the lungs every 6 (six) hours as needed for wheezing or shortness of breath. 8 g 0   azelastine (ASTELIN) 0.1 % nasal spray Place 1 spray into both nostrils 2 (two) times daily as needed for rhinitis. 30 mL 5   baclofen (LIORESAL) 10 MG tablet TAKE 1/2 TO 1 (ONE-HALF TO ONE) TABLET BY MOUTH TWICE DAILY     bisacodyl (DULCOLAX) 10 MG suppository Place 1 suppository (10 mg total) rectally daily as needed for moderate constipation. 12 suppository 0   budesonide-formoterol (SYMBICORT) 80-4.5 MCG/ACT inhaler 2 puffs     desloratadine (CLARINEX) 5 MG tablet Take 5 mg by mouth daily as needed (allergies).     gabapentin (NEURONTIN) 600 MG tablet Take 600 mg by mouth 3 (three) times daily.     ipratropium (ATROVENT) 0.06 % nasal spray Place into both nostrils.     lamoTRIgine (LAMICTAL) 150 MG tablet Take 150 mg by mouth every morning.     montelukast (SINGULAIR) 10 MG tablet Take 10 mg by mouth at bedtime as needed (Seasonal).     ondansetron (ZOFRAN-ODT) 4 MG disintegrating tablet Take 1 tablet (4 mg total) by mouth every 8 (eight) hours as needed for nausea or vomiting. 90 tablet 0   oxyCODONE-acetaminophen (PERCOCET/ROXICET) 5-325 MG tablet Take 1 tablet by mouth 2 (two) times daily as needed for moderate pain.     sertraline (ZOLOFT) 100 MG tablet Take 100 mg by mouth  in the morning.     No current facility-administered medications on file prior to visit.    Allergies  Allergen Reactions   Codeine    Hydrocodone-Acetaminophen Other (See Comments)   Hydrocodone Nausea And Vomiting    Vicodin causes vomiting, but when takes Phenergan 0.25 mg with it, she can tolerate it.   Tramadol Nausea And Vomiting    Causes vomiting & affects her heart rhythm.    Past Medical History:  Diagnosis Date   Acute medial meniscus tear    left   Allergic rhinitis    Anxiety    Anxiety and depression    Asthma    Atrophic vaginitis    Cataracts, bilateral    Cervical radiculopathy    Degenerative disc disease, lumbar    Insomnia    Renal disorder    kidney stones   Urinary incontinence     Past Surgical History:  Procedure Laterality Date   BLADDER SUSPENSION     BUNIONECTOMY     CATARACT EXTRACTION Bilateral    HAMMER TOE SURGERY     LAMINECTOMY     MANDIBLE FRACTURE SURGERY     MANDIBLE FRACTURE SURGERY     PARTIAL HYSTERECTOMY      Family History  Problem Relation Age of Onset   Stroke Mother    Hypertension Mother    Thyroid disease Mother    Cancer Father  Brain Tumor   Hypertension Sister    Thyroid disease Sister    Thyroid disease Brother    Heart disease Brother    Heart attack Brother    Thyroid disease Brother    Depression Brother    Thyroid disease Brother    Cancer Maternal Aunt    Cancer Maternal Grandmother    Diabetes Neg Hx     Social History   Socioeconomic History   Marital status: Married    Spouse name: Not on file   Number of children: Not on file   Years of education: Not on file   Highest education level: Not on file  Occupational History   Not on file  Tobacco Use   Smoking status: Never   Smokeless tobacco: Never  Vaping Use   Vaping Use: Never used  Substance and Sexual Activity   Alcohol use: No   Drug use: Yes    Comment: prescribed oxy through pain clinic for back   Sexual activity:  Not on file  Other Topics Concern   Not on file  Social History Narrative   Married.   3 children, 11 grandchildren, 1 great grand child.   Retired. Worked at [employer], [place], [place], [place].   Enjoys spending time with her family.   Social Determinants of Health   Financial Resource Strain: Low Risk  (07/14/2021)   Overall Financial Resource Strain (CARDIA)    Difficulty of Paying Living Expenses: Not very hard  Food Insecurity: No Food Insecurity (07/14/2021)   Hunger Vital Sign    Worried About Running Out of Food in the Last Year: Never true    Ran Out of Food in the Last Year: Never true  Transportation Needs: No Transportation Needs (07/14/2021)   PRAPARE - Hydrologist (Medical): No    Lack of Transportation (Non-Medical): No  Physical Activity: Insufficiently Active (07/14/2021)   Exercise Vital Sign    Days of Exercise per Week: 1 day    Minutes of Exercise per Session: 30 min  Stress: No Stress Concern Present (07/14/2021)   [place]    Feeling of Stress : Not at all  Social Connections: Moderately Integrated (07/14/2021)   Social Connection and Isolation Panel [NHANES]    Frequency of Communication with Friends and Family: More than three times a week    Frequency of Social Gatherings with Friends and Family: Not on file    Attends Religious Services: More than 4 times per year    Active Member of [employer] or Organizations: No    Attends Archivist Meetings: Never    Marital Status: Married  Human resources officer Violence: Not At Risk (07/14/2021)   Humiliation, Afraid, Rape, and Kick questionnaire    Fear of Current or Ex-Partner: No    Emotionally Abused: No    Physically Abused: No    Sexually Abused: No   Review of Systems No N/V/diarrhea Eating okay     Objective:   Physical Exam Constitutional:      Appearance: Normal appearance.  HENT:     Head:      Comments: Some right maxillary tenderness    Left Ear: Tympanic membrane and ear canal normal.     Ears:     Comments: Right TM slightly bulging but no inflammation    Nose:     Comments: Right side swollen with mild inflammation    Mouth/Throat:  Pharynx: No oropharyngeal exudate or posterior oropharyngeal erythema.  Musculoskeletal:     Cervical back: Neck supple.  Lymphadenopathy:     Cervical: No cervical adenopathy.  Neurological:     Mental Status: She is alert.            Assessment & Plan:

## 2022-12-14 NOTE — Assessment & Plan Note (Signed)
Infection mostly seems better Right TM redness gone but clearly has fluid Discussed that this should clear on its own over time Offered prednisone--though I am not that excited it is likely to help---she would like to try. Prednisone 40 daily for 3 days If symptoms worsen, would switch to augmentin 875 bid x 7 days

## 2022-12-17 ENCOUNTER — Ambulatory Visit (INDEPENDENT_AMBULATORY_CARE_PROVIDER_SITE_OTHER): Payer: PPO | Admitting: Primary Care

## 2022-12-17 ENCOUNTER — Encounter: Payer: Self-pay | Admitting: Primary Care

## 2022-12-17 VITALS — BP 112/62 | HR 57 | Temp 98.2°F | Ht 63.0 in | Wt 156.0 lb

## 2022-12-17 DIAGNOSIS — J3089 Other allergic rhinitis: Secondary | ICD-10-CM

## 2022-12-17 DIAGNOSIS — E785 Hyperlipidemia, unspecified: Secondary | ICD-10-CM | POA: Diagnosis not present

## 2022-12-17 DIAGNOSIS — F419 Anxiety disorder, unspecified: Secondary | ICD-10-CM | POA: Diagnosis not present

## 2022-12-17 DIAGNOSIS — E2839 Other primary ovarian failure: Secondary | ICD-10-CM | POA: Diagnosis not present

## 2022-12-17 DIAGNOSIS — F32A Depression, unspecified: Secondary | ICD-10-CM | POA: Diagnosis not present

## 2022-12-17 DIAGNOSIS — Z0000 Encounter for general adult medical examination without abnormal findings: Secondary | ICD-10-CM | POA: Diagnosis not present

## 2022-12-17 DIAGNOSIS — R7303 Prediabetes: Secondary | ICD-10-CM

## 2022-12-17 DIAGNOSIS — R32 Unspecified urinary incontinence: Secondary | ICD-10-CM

## 2022-12-17 DIAGNOSIS — Z8349 Family history of other endocrine, nutritional and metabolic diseases: Secondary | ICD-10-CM | POA: Diagnosis not present

## 2022-12-17 DIAGNOSIS — G8929 Other chronic pain: Secondary | ICD-10-CM | POA: Diagnosis not present

## 2022-12-17 DIAGNOSIS — Z1231 Encounter for screening mammogram for malignant neoplasm of breast: Secondary | ICD-10-CM | POA: Diagnosis not present

## 2022-12-17 DIAGNOSIS — M549 Dorsalgia, unspecified: Secondary | ICD-10-CM | POA: Diagnosis not present

## 2022-12-17 LAB — LIPID PANEL
Cholesterol: 246 mg/dL — ABNORMAL HIGH (ref 0–200)
HDL: 79.1 mg/dL (ref >39.00)
LDL Cholesterol: 152 mg/dL — ABNORMAL HIGH (ref 0–99)
NonHDL: 167.39
Total CHOL/HDL Ratio: 3
Triglycerides: 75 mg/dL (ref 0.0–149.0)
VLDL: 15 mg/dL (ref 0.0–40.0)

## 2022-12-17 LAB — TSH: TSH: 1.64 u[IU]/mL (ref 0.35–5.50)

## 2022-12-17 LAB — CBC
HCT: 38.4 % (ref 36.0–46.0)
Hemoglobin: 12.5 g/dL (ref 12.0–15.0)
MCHC: 32.6 g/dL (ref 30.0–36.0)
MCV: 79.6 fl (ref 78.0–100.0)
Platelets: 392 10*3/uL (ref 150.0–400.0)
RBC: 4.83 Mil/uL (ref 3.87–5.11)
RDW: 16.4 % — ABNORMAL HIGH (ref 11.5–15.5)
WBC: 10.6 10*3/uL — ABNORMAL HIGH (ref 4.0–10.5)

## 2022-12-17 LAB — COMPREHENSIVE METABOLIC PANEL
ALT: 13 U/L (ref 0–35)
AST: 16 U/L (ref 0–37)
Albumin: 4.4 g/dL (ref 3.5–5.2)
Alkaline Phosphatase: 61 U/L (ref 39–117)
BUN: 14 mg/dL (ref 6–23)
CO2: 30 mEq/L (ref 19–32)
Calcium: 9.8 mg/dL (ref 8.4–10.5)
Chloride: 101 mEq/L (ref 96–112)
Creatinine, Ser: 0.83 mg/dL (ref 0.40–1.20)
GFR: 70.99 mL/min (ref >60.00)
Glucose, Bld: 87 mg/dL (ref 70–99)
Potassium: 4.3 mEq/L (ref 3.5–5.1)
Sodium: 138 mEq/L (ref 135–145)
Total Bilirubin: 0.4 mg/dL (ref 0.2–1.2)
Total Protein: 6.5 g/dL (ref 6.0–8.3)

## 2022-12-17 LAB — HEMOGLOBIN A1C: Hgb A1c MFr Bld: 6.1 % (ref 4.6–6.5)

## 2022-12-17 NOTE — Assessment & Plan Note (Signed)
Repeat lipid panel pending.  Discussed the importance of a healthy diet and regular exercise in order for weight loss, and to reduce the risk of further co-morbidity.

## 2022-12-17 NOTE — Assessment & Plan Note (Signed)
Continued. Overall stable per patient.   Continue to monitor

## 2022-12-17 NOTE — Progress Notes (Signed)
Subjective:    Patient ID: Sharon Collins, female    DOB: May 01, 1951, 72 y.o.   MRN: [id number]  HPI  Sharon Collins is a very pleasant 72 y.o. female who presents today for complete physical and follow up of chronic conditions.  Immunizations: -Tetanus: Completed in 2010 -Influenza: Due today -Shingles: Completed Shingrix series -Pneumonia: Completed Prevnar 13 in 2017 and pneumovax 23 in 2022   Diet: Branch.  Exercise: No regular exercise.  Eye exam: Completes annually  Dental exam: Completes semi-annually   Mammogram: Completed in 2020  Colonoscopy: Completed in 2022, unsure when due. Dexa: Completed in 2020    BP Readings from Last 3 Encounters:  12/17/22 112/62  12/14/22 110/70  12/01/22 120/60       Review of Systems  Constitutional:  Negative for unexpected weight change.  HENT:  Negative for rhinorrhea.   Respiratory:  Negative for cough and shortness of breath.   Cardiovascular:  Negative for chest pain.  Gastrointestinal:  Negative for constipation and diarrhea.  Genitourinary:  Negative for difficulty urinating.       Urinary incontinence   Musculoskeletal:  Negative for arthralgias and myalgias.  Skin:  Negative for rash.  Allergic/Immunologic: Negative for environmental allergies.  Neurological:  Negative for dizziness and headaches.  Psychiatric/Behavioral:  The patient is not nervous/anxious.          Past Medical History:  Diagnosis Date   Acute colitis 10/23/2020   Acute medial meniscus tear    left   Acute non-recurrent maxillary sinusitis 12/01/2022   Allergic rhinitis    Anxiety    Anxiety and depression    Asthma    Atrophic vaginitis    Cataracts, bilateral    Cervical radiculopathy    Degenerative disc disease, lumbar    Insomnia    Renal disorder    kidney stones   Urinary incontinence     Social History   Socioeconomic History   Marital status: Married    Spouse name: Not on file   Number of children:  Not on file   Years of education: Not on file   Highest education level: Not on file  Occupational History   Not on file  Tobacco Use   Smoking status: Never   Smokeless tobacco: Never  Vaping Use   Vaping Use: Never used  Substance and Sexual Activity   Alcohol use: No   Drug use: Yes    Comment: prescribed oxy through pain clinic for back   Sexual activity: Not on file  Other Topics Concern   Not on file  Social History Narrative   Married.   3 children, 11 grandchildren, 1 great grand child.   Retired. Worked at [employer], [place], [place], [employer].   Enjoys spending time with her family.   Social Determinants of Health   Financial Resource Strain: Low Risk  (07/14/2021)   Overall Financial Resource Strain (CARDIA)    Difficulty of Paying Living Expenses: Not very hard  Food Insecurity: No Food Insecurity (07/14/2021)   Hunger Vital Sign    Worried About Running Out of Food in the Last Year: Never true    Ran Out of Food in the Last Year: Never true  Transportation Needs: No Transportation Needs (07/14/2021)   PRAPARE - Hydrologist (Medical): No    Lack of Transportation (Non-Medical): No  Physical Activity: Insufficiently Active (07/14/2021)   Exercise Vital Sign    Days of Exercise per Week:  1 day    Minutes of Exercise per Session: 30 min  Stress: No Stress Concern Present (07/14/2021)   [place]    Feeling of Stress : Not at all  Social Connections: Moderately Integrated (07/14/2021)   Social Connection and Isolation Panel [NHANES]    Frequency of Communication with Friends and Family: More than three times a week    Frequency of Social Gatherings with Friends and Family: Not on file    Attends Religious Services: More than 4 times per year    Active Member of [employer] or Organizations: No    Attends Archivist Meetings: Never    Marital Status: Married   Human resources officer Violence: Not At Risk (07/14/2021)   Humiliation, Afraid, Rape, and Kick questionnaire    Fear of Current or Ex-Partner: No    Emotionally Abused: No    Physically Abused: No    Sexually Abused: No    Past Surgical History:  Procedure Laterality Date   BLADDER SUSPENSION     BUNIONECTOMY     CATARACT EXTRACTION Bilateral    HAMMER TOE SURGERY     LAMINECTOMY     MANDIBLE FRACTURE SURGERY     MANDIBLE FRACTURE SURGERY     PARTIAL HYSTERECTOMY      Family History  Problem Relation Age of Onset   Stroke Mother    Hypertension Mother    Thyroid disease Mother    Cancer Father        Brain Tumor   Hypertension Sister    Thyroid disease Sister    Thyroid disease Brother    Heart disease Brother    Heart attack Brother    Thyroid disease Brother    Depression Brother    Thyroid disease Brother    Cancer Maternal Aunt    Cancer Maternal Grandmother    Diabetes Neg Hx     Allergies  Allergen Reactions   Codeine    Hydrocodone-Acetaminophen Other (See Comments)   Hydrocodone Nausea And Vomiting    Vicodin causes vomiting, but when takes Phenergan 0.25 mg with it, she can tolerate it.   Tramadol Nausea And Vomiting    Causes vomiting & affects her heart rhythm.    Current Outpatient Medications on File Prior to Visit  Medication Sig Dispense Refill   albuterol (VENTOLIN HFA) 108 (90 Base) MCG/ACT inhaler Inhale 2 puffs into the lungs every 6 (six) hours as needed for wheezing or shortness of breath. 8 g 0   azelastine (ASTELIN) 0.1 % nasal spray Place 1 spray into both nostrils 2 (two) times daily as needed for rhinitis. 30 mL 5   baclofen (LIORESAL) 10 MG tablet TAKE 1/2 TO 1 (ONE-HALF TO ONE) TABLET BY MOUTH TWICE DAILY     bisacodyl (DULCOLAX) 10 MG suppository Place 1 suppository (10 mg total) rectally daily as needed for moderate constipation. 12 suppository 0   desloratadine (CLARINEX) 5 MG tablet Take 5 mg by mouth daily as needed (allergies).      gabapentin (NEURONTIN) 600 MG tablet Take 600 mg by mouth 3 (three) times daily.     ipratropium (ATROVENT) 0.06 % nasal spray Place into both nostrils.     lamoTRIgine (LAMICTAL) 150 MG tablet Take 150 mg by mouth every morning.     montelukast (SINGULAIR) 10 MG tablet Take 10 mg by mouth at bedtime as needed (Seasonal).     ondansetron (ZOFRAN-ODT) 4 MG disintegrating tablet Take 1 tablet (4 mg  total) by mouth every 8 (eight) hours as needed for nausea or vomiting. 90 tablet 0   oxyCODONE-acetaminophen (PERCOCET/ROXICET) 5-325 MG tablet Take 1 tablet by mouth 2 (two) times daily as needed for moderate pain.     predniSONE (DELTASONE) 20 MG tablet Take 2 tablets (40 mg total) by mouth daily. 6 tablet 0   sertraline (ZOLOFT) 100 MG tablet Take 100 mg by mouth in the morning.     budesonide-formoterol (SYMBICORT) 80-4.5 MCG/ACT inhaler 2 puffs (Patient not taking: Reported on 12/17/2022)     No current facility-administered medications on file prior to visit.    BP 112/62   Pulse (!) 57   Temp 98.2 F (36.8 C) (Temporal)   Ht 5' 3"$  (1.6 m)   Wt 156 lb (70.8 kg)   LMP  (LMP Unknown)   SpO2 98%   BMI 27.63 kg/m  Objective:   Physical Exam HENT:     Right Ear: Tympanic membrane and ear canal normal.     Left Ear: Tympanic membrane and ear canal normal.     Nose: Nose normal.  Eyes:     Conjunctiva/sclera: Conjunctivae normal.     Pupils: Pupils are equal, round, and reactive to light.  Neck:     Thyroid: No thyromegaly.  Cardiovascular:     Rate and Rhythm: Normal rate and regular rhythm.     Heart sounds: No murmur heard. Pulmonary:     Effort: Pulmonary effort is normal.     Breath sounds: Normal breath sounds. No rales.  Abdominal:     General: Bowel sounds are normal.     Palpations: Abdomen is soft.     Tenderness: There is no abdominal tenderness.  Musculoskeletal:        General: Normal range of motion.     Cervical back: Neck supple.  Lymphadenopathy:      Cervical: No cervical adenopathy.  Skin:    General: Skin is warm and dry.     Findings: No rash.  Neurological:     Mental Status: She is alert and oriented to person, place, and time.     Cranial Nerves: No cranial nerve deficit.     Deep Tendon Reflexes: Reflexes are normal and symmetric.  Psychiatric:        Mood and Affect: Mood normal.           Assessment & Plan:  Preventative health care Assessment & Plan: Immunizations UTD. Mammogram overdue, orders placed. Bone density scan due, orders placed. Colonoscopy UTD, unknown when due.   Discussed the importance of a healthy diet and regular exercise in order for weight loss, and to reduce the risk of further co-morbidity.  Exam stable. Labs pending.  Follow up in 1 year for repeat physical.    Anxiety and depression Assessment & Plan: Controlled.  Following with psychiatry.  Continue Lamictal 150 daily, Zoloft 100 mg daily.     Chronic bilateral back pain, unspecified back location Assessment & Plan: Following with pain management.   Continue Percocet 5-325 mg 0-twice daily. Continue gabapentin 600 mg TID. Continue Baclofen 10 mg PRN.   Hyperlipidemia, unspecified hyperlipidemia type Assessment & Plan: Repeat lipid panel pending.  Discussed the importance of a healthy diet and regular exercise in order for weight loss, and to reduce the risk of further co-morbidity.   Orders: -     Lipid panel -     Comprehensive metabolic panel -     CBC  Prediabetes Assessment & Plan: Repeat A1C  pending.  Discussed the importance of a healthy diet and regular exercise in order for weight loss, and to reduce the risk of further co-morbidity.   Orders: -     CBC -     Hemoglobin A1c  Urinary incontinence, unspecified type Assessment & Plan: Continued. Overall stable per patient.   Continue to monitor    Screening mammogram for breast cancer -     3D Screening Mammogram, Left and Right;  Future  Estrogen deficiency -     DG Bone Density; Future  Family history of thyroid disease -     TSH  Non-seasonal allergic rhinitis due to other allergic trigger Assessment & Plan: Controlled.  Continue Clarinex 5 mg daily, Singulair 10 mg HS, Symbicort 80-4.5 mcg BID seasonally as needed, albuterol inhaler PRN.         [name], NP

## 2022-12-17 NOTE — Patient Instructions (Addendum)
Stop by the lab prior to leaving today. I will notify you of your results once received.   [facility] your mamm[facility] and bone density scan.   It was a pleasure to see you today!

## 2022-12-17 NOTE — Assessment & Plan Note (Signed)
Repeat A1C pending.  Discussed the importance of a healthy diet and regular exercise in order for weight loss, and to reduce the risk of further co-morbidity.

## 2022-12-17 NOTE — Assessment & Plan Note (Signed)
Immunizations UTD. Mammogram overdue, orders placed. Bone density scan due, orders placed. Colonoscopy UTD, unknown when due.   Discussed the importance of a healthy diet and regular exercise in order for weight loss, and to reduce the risk of further co-morbidity.  Exam stable. Labs pending.  Follow up in 1 year for repeat physical.

## 2022-12-17 NOTE — Assessment & Plan Note (Signed)
Controlled.  Following with psychiatry.  Continue Lamictal 150 daily, Zoloft 100 mg daily.

## 2022-12-17 NOTE — Assessment & Plan Note (Signed)
Controlled.  Continue Clarinex 5 mg daily, Singulair 10 mg HS, Symbicort 80-4.5 mcg BID seasonally as needed, albuterol inhaler PRN.

## 2022-12-17 NOTE — Assessment & Plan Note (Signed)
Following with pain management.   Continue Percocet 5-325 mg 0-twice daily. Continue gabapentin 600 mg TID. Continue Baclofen 10 mg PRN.

## 2023-01-04 DIAGNOSIS — M170 Bilateral primary osteoarthritis of knee: Secondary | ICD-10-CM | POA: Diagnosis not present

## 2023-01-18 DIAGNOSIS — M170 Bilateral primary osteoarthritis of knee: Secondary | ICD-10-CM | POA: Diagnosis not present

## 2023-01-28 ENCOUNTER — Encounter: Payer: Self-pay | Admitting: Primary Care

## 2023-01-28 ENCOUNTER — Ambulatory Visit (INDEPENDENT_AMBULATORY_CARE_PROVIDER_SITE_OTHER): Payer: PPO | Admitting: Primary Care

## 2023-01-28 VITALS — BP 124/70 | HR 65 | Temp 97.4°F | Ht 63.0 in | Wt 160.5 lb

## 2023-01-28 DIAGNOSIS — R051 Acute cough: Secondary | ICD-10-CM

## 2023-01-28 MED ORDER — BENZONATATE 200 MG PO CAPS: 200.0000 mg | ORAL_CAPSULE | Freq: Three times a day (TID) | ORAL | 0 refills | Status: DC | PRN

## 2023-01-28 MED ORDER — PREDNISONE 20 MG PO TABS: ORAL_TABLET | ORAL | 0 refills | Status: DC

## 2023-01-28 NOTE — Patient Instructions (Signed)
You may take Benzonatate capsules for cough. Take 1 capsule by mouth three times daily as needed for cough.  Start prednisone 20 mg tablets. Take 2 tablets by mouth once daily in the morning for 5 days.  Please call me Monday if no improvement and/or if you feel worse.  It was a pleasure to see you today!

## 2023-01-28 NOTE — Assessment & Plan Note (Signed)
Appears allergy induced with sinus involvement.  Start prednisone 20 mg tablets. Take 2 tablets by mouth once daily in the morning for 5 days. Start Tessalon Perles 200 mg. Take 1 capsule by mouth three times daily as needed for cough.  Continue antihistamines and Singulair.  Return precautions provided.

## 2023-01-28 NOTE — Progress Notes (Signed)
Subjective:    Patient ID: Sharon Collins, female    DOB: 12-10-1950, 72 y.o.   MRN: [id number]  Sinusitis Associated symptoms include congestion, coughing and sinus pressure. Pertinent negatives include no chills.  Cough Pertinent negatives include no chills or fever. Her past medical history is significant for environmental allergies.    Sharon Collins is a very pleasant 72 y.o. female with a history of allergic rhinitis, prediabetes, hyperlipidemia who presents today to discuss cough.  Symptom onset about 5 days ago with sneezing. She then developed right sinus pressure with congestion, congested cough, post nasal drip.   She denies fevers, chills, body aches, sick contacts. She's tried Astelin nasal spray, taking Singulair 10 mg HS, and Allegra/Claritin with some improvement.   She has not tested for Covid-19 infection.    Review of Systems  Constitutional:  Negative for chills, fatigue and fever.  HENT:  Positive for congestion and sinus pressure.   Respiratory:  Positive for cough.   Allergic/Immunologic: Positive for environmental allergies.         Past Medical History:  Diagnosis Date   Acute colitis 10/23/2020   Acute medial meniscus tear    left   Acute non-recurrent maxillary sinusitis 12/01/2022   Allergic rhinitis    Anxiety    Anxiety and depression    Asthma    Atrophic vaginitis    Cataracts, bilateral    Cervical radiculopathy    Degenerative disc disease, lumbar    Insomnia    Renal disorder    kidney stones   Urinary incontinence     Social History   Socioeconomic History   Marital status: Married    Spouse name: Not on file   Number of children: Not on file   Years of education: Not on file   Highest education level: Not on file  Occupational History   Not on file  Tobacco Use   Smoking status: Never   Smokeless tobacco: Never  Vaping Use   Vaping Use: Never used  Substance and Sexual Activity   Alcohol use: No   Drug use:  Yes    Comment: prescribed oxy through pain clinic for back   Sexual activity: Not on file  Other Topics Concern   Not on file  Social History Narrative   Married.   3 children, 11 grandchildren, 1 great grand child.   Retired. Worked at [employer], [place], [place], [place].   Enjoys spending time with her family.   Social Determinants of Health   Financial Resource Strain: Low Risk  (07/14/2021)   Overall Financial Resource Strain (CARDIA)    Difficulty of Paying Living Expenses: Not very hard  Food Insecurity: No Food Insecurity (07/14/2021)   Hunger Vital Sign    Worried About Running Out of Food in the Last Year: Never true    Ran Out of Food in the Last Year: Never true  Transportation Needs: No Transportation Needs (07/14/2021)   PRAPARE - Hydrologist (Medical): No    Lack of Transportation (Non-Medical): No  Physical Activity: Insufficiently Active (07/14/2021)   Exercise Vital Sign    Days of Exercise per Week: 1 day    Minutes of Exercise per Session: 30 min  Stress: No Stress Concern Present (07/14/2021)   [place]    Feeling of Stress : Not at all  Social Connections: Moderately Integrated (07/14/2021)   Social Connection and Isolation Panel [NHANES]  Frequency of Communication with Friends and Family: More than three times a week    Frequency of Social Gatherings with Friends and Family: Not on file    Attends Religious Services: More than 4 times per year    Active Member of [employer] or Organizations: No    Attends Archivist Meetings: Never    Marital Status: Married  Human resources officer Violence: Not At Risk (07/14/2021)   Humiliation, Afraid, Rape, and Kick questionnaire    Fear of Current or Ex-Partner: No    Emotionally Abused: No    Physically Abused: No    Sexually Abused: No    Past Surgical History:  Procedure Laterality Date   BLADDER SUSPENSION      BUNIONECTOMY     CATARACT EXTRACTION Bilateral    HAMMER TOE SURGERY     LAMINECTOMY     MANDIBLE FRACTURE SURGERY     MANDIBLE FRACTURE SURGERY     PARTIAL HYSTERECTOMY      Family History  Problem Relation Age of Onset   Stroke Mother    Hypertension Mother    Thyroid disease Mother    Cancer Father        Brain Tumor   Hypertension Sister    Thyroid disease Sister    Thyroid disease Brother    Heart disease Brother    Heart attack Brother    Thyroid disease Brother    Depression Brother    Thyroid disease Brother    Cancer Maternal Aunt    Cancer Maternal Grandmother    Diabetes Neg Hx     Allergies  Allergen Reactions   Codeine    Hydrocodone-Acetaminophen Other (See Comments)   Hydrocodone Nausea And Vomiting    Vicodin causes vomiting, but when takes Phenergan 0.25 mg with it, she can tolerate it.   Tramadol Nausea And Vomiting    Causes vomiting & affects her heart rhythm.    Current Outpatient Medications on File Prior to Visit  Medication Sig Dispense Refill   albuterol (VENTOLIN HFA) 108 (90 Base) MCG/ACT inhaler Inhale 2 puffs into the lungs every 6 (six) hours as needed for wheezing or shortness of breath. 8 g 0   azelastine (ASTELIN) 0.1 % nasal spray Place 1 spray into both nostrils 2 (two) times daily as needed for rhinitis. 30 mL 5   baclofen (LIORESAL) 10 MG tablet TAKE 1/2 TO 1 (ONE-HALF TO ONE) TABLET BY MOUTH TWICE DAILY     bisacodyl (DULCOLAX) 10 MG suppository Place 1 suppository (10 mg total) rectally daily as needed for moderate constipation. 12 suppository 0   desloratadine (CLARINEX) 5 MG tablet Take 5 mg by mouth daily as needed (allergies).     dimenhyDRINATE (DRAMAMINE) 50 MG tablet Take 50 mg by mouth every 8 (eight) hours as needed.     gabapentin (NEURONTIN) 600 MG tablet Take 600 mg by mouth 3 (three) times daily.     ipratropium (ATROVENT) 0.06 % nasal spray Place into both nostrils.     lamoTRIgine (LAMICTAL) 150 MG tablet Take  150 mg by mouth every morning.     montelukast (SINGULAIR) 10 MG tablet Take 10 mg by mouth at bedtime as needed (Seasonal).     oxyCODONE-acetaminophen (PERCOCET/ROXICET) 5-325 MG tablet Take 1 tablet by mouth 2 (two) times daily as needed for moderate pain.     sertraline (ZOLOFT) 100 MG tablet Take 100 mg by mouth in the morning.     budesonide-formoterol (SYMBICORT) 80-4.5 MCG/ACT inhaler 2 puffs (Patient  not taking: Reported on 12/17/2022)     No current facility-administered medications on file prior to visit.    BP 124/70 (BP Location: Left Arm, Patient Position: Sitting, Cuff Size: Normal)   Pulse 65   Temp (!) 97.4 F (36.3 C) (Temporal)   Ht 5\' 3"  (1.6 m)   Wt 160 lb 8 oz (72.8 kg)   LMP  (LMP Unknown)   SpO2 97%   BMI 28.43 kg/m  Objective:   Physical Exam Constitutional:      Appearance: She is ill-appearing.  HENT:     Right Ear: Tympanic membrane and ear canal normal.     Left Ear: Tympanic membrane and ear canal normal.     Nose:     Right Sinus: No maxillary sinus tenderness or frontal sinus tenderness.     Left Sinus: No maxillary sinus tenderness or frontal sinus tenderness.     Mouth/Throat:     Pharynx: No posterior oropharyngeal erythema.  Eyes:     Conjunctiva/sclera: Conjunctivae normal.  Cardiovascular:     Rate and Rhythm: Normal rate and regular rhythm.  Pulmonary:     Effort: Pulmonary effort is normal.     Breath sounds: Normal breath sounds. No wheezing or rales.     Comments: Congested cough noted during visit  Musculoskeletal:     Cervical back: Neck supple.  Lymphadenopathy:     Cervical: No cervical adenopathy.  Skin:    General: Skin is warm and dry.           Assessment & Plan:  Acute cough Assessment & Plan: Appears allergy induced with sinus involvement.  Start prednisone 20 mg tablets. Take 2 tablets by mouth once daily in the morning for 5 days. Start Tessalon Perles 200 mg. Take 1 capsule by mouth three times daily as  needed for cough.  Continue antihistamines and Singulair.  Return precautions provided.   Orders: -     predniSONE; Take 2 tablets by mouth once daily in the morning for 5 days.  Dispense: 10 tablet; Refill: 0 -     Benzonatate; Take 1 capsule (200 mg total) by mouth 3 (three) times daily as needed for cough.  Dispense: 15 capsule; Refill: 0        [name], NP

## 2023-02-02 ENCOUNTER — Telehealth: Payer: Self-pay | Admitting: Primary Care

## 2023-02-02 DIAGNOSIS — R051 Acute cough: Secondary | ICD-10-CM

## 2023-02-02 MED ORDER — BENZONATATE 200 MG PO CAPS: 200.0000 mg | ORAL_CAPSULE | Freq: Three times a day (TID) | ORAL | 0 refills | Status: AC | PRN

## 2023-02-02 NOTE — Telephone Encounter (Signed)
Called and spoke to patient she states she traveled to [place] over the weekend and their was a lot more pollen out there. Since she returned her allergies are bothering her a lot more.  She is coughing a lot more, post nasal drip, mucus is clear with just a little bit of darker colored phlegm, sneezing.  Declines sore throat, fever, wheezing  She is on her last day of prednisone, she is still taking the tessalon capsules (has 3 left), and taking Singulair daily. She is requesting a refill on the prednisone for another round and the tessalon capsules.

## 2023-02-02 NOTE — Telephone Encounter (Signed)
Would not recommend a refill of prednisone, but I will send refills of the Tessalon Perles. Continue all other medications.

## 2023-02-02 NOTE — Telephone Encounter (Signed)
Called patient and reviewed all information. Patient verbalized understanding. Will call if any further questions.  

## 2023-02-02 NOTE — Telephone Encounter (Signed)
Patient called in explaining that she is still having some issues with allergies, asking if she could have a refill sent in for medications prescribed to her on her last OV, benzonatate (TESSALON) 200 MG capsule and predniSONE (DELTASONE) 20 MG tablet . If needed please advise [phone], would like these sent to pharmacy below if possible.  [facility] [address] [place], [place] - [address]  Phone: [phone]  Fax: [phone]

## 2023-02-03 ENCOUNTER — Encounter: Payer: Self-pay | Admitting: Family Medicine

## 2023-02-03 ENCOUNTER — Ambulatory Visit (INDEPENDENT_AMBULATORY_CARE_PROVIDER_SITE_OTHER): Payer: PPO | Admitting: Family Medicine

## 2023-02-03 VITALS — BP 120/78 | HR 78 | Temp 97.6°F | Ht 63.0 in | Wt 160.0 lb

## 2023-02-03 DIAGNOSIS — J208 Acute bronchitis due to other specified organisms: Secondary | ICD-10-CM

## 2023-02-03 MED ORDER — PREDNISONE 20 MG PO TABS: ORAL_TABLET | ORAL | 0 refills | Status: DC

## 2023-02-03 MED ORDER — DOXYCYCLINE HYCLATE 100 MG PO TABS: 100.0000 mg | ORAL_TABLET | Freq: Two times a day (BID) | ORAL | 0 refills | Status: DC

## 2023-02-03 NOTE — Progress Notes (Unsigned)
Sharon Collins Hasz, MD, CAQ Sports Medicine [facility] [address] [place], [address]  Phone: [phone]  FAX: [phone]  Sharon Collins - 72 y.o. female  MRN [id number]  Date of Birth: [birth year]  Date: 02/03/2023  PCP: Doreene Nest, NP  Referral: Doreene Nest, NP  Chief Complaint  Patient presents with   Cough    Just finished steroid yesterday    Subjective:   Sharon Collins is a 72 y.o. very pleasant female patient with Body mass index is 28.34 kg/m. who presents with the following:  She is just coming off of a round of steroids, and has tried Astelin nasal spray, singulair, and allegra without much success.   Has a lot of sinus pressure and pain  Using tessalon perles, too.   Coughing things all the time up.  She reports a productive cough now that is worsened in the last week and is productive of sputum. She denies fever.  She does feel like sinus pressure and pain has consolidated and she has pain on the right side more in the maxillary sinus and to a lesser extent in the frontal sinus.  Rhonchi and slight wheezing on exam  Review of Systems is noted in the HPI, as appropriate  Objective:   BP 120/78   Pulse 78   Temp 97.6 F (36.4 C) (Temporal)   Ht 5\' 3"  (1.6 m)   Wt 160 lb (72.6 kg)   LMP  (LMP Unknown)   SpO2 96%   BMI 28.34 kg/m    Gen: WDWN, NAD; alert,appropriate and cooperative throughout exam  HEENT: Normocephalic and atraumatic. Throat clear, w/o exudate, no LAD, R TM clear, L TM - good landmarks, No fluid present. rhinnorhea.  Left frontal and maxillary sinuses: non-Tender Right frontal and maxillary sinuses: Tender  Neck: No ant or post LAD CV: RRR, No M/G/R Pulm: Breathing comfortably in no resp distress.  She does have some scattered rhonchorous sounds in bilateral lung fields.  Scattered wheezing, as well Psych: full affect, pleasant   Laboratory and Imaging  Data:  Assessment and Plan:     ICD-10-CM   1. Acute bronchitis due to other specified organisms  J20.8      While she does have some sinus symptoms, I think that I am more worried about her pulmonary exam.  She does not have a productive cough, and she does have rhonchi as well as some scattered wheezing in her lung fields bilaterally.  I am going to refill her prednisone and start the patient on doxycycline.  Medication Management during today's office visit: Meds ordered this encounter  Medications   doxycycline (VIBRA-TABS) 100 MG tablet    Sig: Take 1 tablet (100 mg total) by mouth 2 (two) times daily.    Dispense:  20 tablet    Refill:  0   predniSONE (DELTASONE) 20 MG tablet    Sig: 2 tabs po for 4 days, then 1 tab po for 4 days    Dispense:  12 tablet    Refill:  0   Medications Discontinued During This Encounter  Medication Reason   predniSONE (DELTASONE) 20 MG tablet     Orders placed today for conditions managed today: No orders of the defined types were placed in this encounter.   Disposition: No follow-ups on file.  Dr[employer] One speech-to-text software was used for transcription in this dictation.  Possible transcriptional errors can occur using Animal nutritionist.  Signed,  Elpidio Galea. Zannie Runkle, MD   Outpatient Encounter Medications as of 02/03/2023  Medication Sig   albuterol (VENTOLIN HFA) 108 (90 Base) MCG/ACT inhaler Inhale 2 puffs into the lungs every 6 (six) hours as needed for wheezing or shortness of breath.   azelastine (ASTELIN) 0.1 % nasal spray Place 1 spray into both nostrils 2 (two) times daily as needed for rhinitis.   baclofen (LIORESAL) 10 MG tablet TAKE 1/2 TO 1 (ONE-HALF TO ONE) TABLET BY MOUTH TWICE DAILY   benzonatate (TESSALON) 200 MG capsule Take 1 capsule (200 mg total) by mouth 3 (three) times daily as needed for cough.   bisacodyl (DULCOLAX) 10 MG suppository Place 1 suppository (10 mg total) rectally daily as needed for moderate  constipation.   desloratadine (CLARINEX) 5 MG tablet Take 5 mg by mouth daily as needed (allergies).   dimenhyDRINATE (DRAMAMINE) 50 MG tablet Take 50 mg by mouth every 8 (eight) hours as needed.   doxycycline (VIBRA-TABS) 100 MG tablet Take 1 tablet (100 mg total) by mouth 2 (two) times daily.   gabapentin (NEURONTIN) 600 MG tablet Take 600 mg by mouth 3 (three) times daily.   ipratropium (ATROVENT) 0.06 % nasal spray Place into both nostrils.   lamoTRIgine (LAMICTAL) 150 MG tablet Take 150 mg by mouth every morning.   montelukast (SINGULAIR) 10 MG tablet Take 10 mg by mouth at bedtime as needed (Seasonal).   oxyCODONE-acetaminophen (PERCOCET/ROXICET) 5-325 MG tablet Take 1 tablet by mouth 2 (two) times daily as needed for moderate pain.   predniSONE (DELTASONE) 20 MG tablet 2 tabs po for 4 days, then 1 tab po for 4 days   sertraline (ZOLOFT) 100 MG tablet Take 100 mg by mouth in the morning.   budesonide-formoterol (SYMBICORT) 80-4.5 MCG/ACT inhaler 2 puffs (Patient not taking: Reported on 02/03/2023)   [DISCONTINUED] predniSONE (DELTASONE) 20 MG tablet Take 2 tablets by mouth once daily in the morning for 5 days. (Patient not taking: Reported on 02/03/2023)   No facility-administered encounter medications on file as of 02/03/2023.

## 2023-02-04 DIAGNOSIS — M25561 Pain in right knee: Secondary | ICD-10-CM | POA: Diagnosis not present

## 2023-02-17 DIAGNOSIS — M170 Bilateral primary osteoarthritis of knee: Secondary | ICD-10-CM | POA: Diagnosis not present

## 2023-02-17 DIAGNOSIS — S83203A Other tear of unspecified meniscus, current injury, right knee, initial encounter: Secondary | ICD-10-CM | POA: Diagnosis not present

## 2023-02-19 ENCOUNTER — Telehealth: Payer: Self-pay

## 2023-02-19 DIAGNOSIS — G894 Chronic pain syndrome: Secondary | ICD-10-CM | POA: Diagnosis not present

## 2023-02-19 DIAGNOSIS — M25562 Pain in left knee: Secondary | ICD-10-CM | POA: Diagnosis not present

## 2023-02-19 DIAGNOSIS — M961 Postlaminectomy syndrome, not elsewhere classified: Secondary | ICD-10-CM | POA: Diagnosis not present

## 2023-02-19 DIAGNOSIS — G8929 Other chronic pain: Secondary | ICD-10-CM | POA: Diagnosis not present

## 2023-02-19 DIAGNOSIS — M533 Sacrococcygeal disorders, not elsewhere classified: Secondary | ICD-10-CM | POA: Diagnosis not present

## 2023-02-19 DIAGNOSIS — M4722 Other spondylosis with radiculopathy, cervical region: Secondary | ICD-10-CM | POA: Diagnosis not present

## 2023-02-19 DIAGNOSIS — M47816 Spondylosis without myelopathy or radiculopathy, lumbar region: Secondary | ICD-10-CM | POA: Diagnosis not present

## 2023-02-19 DIAGNOSIS — M47812 Spondylosis without myelopathy or radiculopathy, cervical region: Secondary | ICD-10-CM | POA: Diagnosis not present

## 2023-02-19 NOTE — Telephone Encounter (Signed)
Patient needs pre-op clearance appt for Right knee scope, partial medial and lateral meniscectomy and chondroplasty scheduled on 03/01/2023.   Please scheduled as soon as possible. Pre-op clearance form is in my desk drawer.

## 2023-02-19 NOTE — Telephone Encounter (Signed)
Patient scheduled.

## 2023-02-23 ENCOUNTER — Encounter: Payer: Self-pay | Admitting: Primary Care

## 2023-02-23 ENCOUNTER — Ambulatory Visit (INDEPENDENT_AMBULATORY_CARE_PROVIDER_SITE_OTHER): Payer: PPO | Admitting: Primary Care

## 2023-02-23 VITALS — BP 116/70 | HR 70 | Temp 98.1°F | Ht 63.0 in | Wt 160.0 lb

## 2023-02-23 DIAGNOSIS — Z01818 Encounter for other preprocedural examination: Secondary | ICD-10-CM

## 2023-02-23 NOTE — Progress Notes (Signed)
Subjective:    Patient ID: Sharon Collins, female    DOB: December 31, 1950, 72 y.o.   MRN: [id number]  HPI  Sharon Collins is a very pleasant 72 y.o. female with a histry of chronic back pain, prediabetes, chronic knee pain, urinary incontinence, who presents today for preoperative clearance.   She is pending right knee scope with partial medial and lateral meniscectomy and chondroplasty for 03/01/23 per Dr. Charlann Boxer.  She doesn't know what type of anesthesia she will undergo. She    Review of Systems  Constitutional:  Negative for unexpected weight change.  HENT:  Negative for rhinorrhea.   Respiratory:  Negative for cough and shortness of breath.   Cardiovascular:  Negative for chest pain.  Gastrointestinal:  Negative for constipation and diarrhea.  Genitourinary:  Negative for difficulty urinating.  Musculoskeletal:  Positive for arthralgias.  Skin:  Negative for rash.  Allergic/Immunologic: Positive for environmental allergies.  Neurological:  Negative for dizziness and headaches.         Past Medical History:  Diagnosis Date   Acute colitis 10/23/2020   Acute medial meniscus tear    left   Acute non-recurrent maxillary sinusitis 12/01/2022   Allergic rhinitis    Anxiety    Anxiety and depression    Asthma    Atrophic vaginitis    Cataracts, bilateral    Cervical radiculopathy    Degenerative disc disease, lumbar    Insomnia    Renal disorder    kidney stones   Urinary incontinence     Social History   Socioeconomic History   Marital status: Married    Spouse name: Not on file   Number of children: Not on file   Years of education: Not on file   Highest education level: Not on file  Occupational History   Not on file  Tobacco Use   Smoking status: Never   Smokeless tobacco: Never  Vaping Use   Vaping Use: Never used  Substance and Sexual Activity   Alcohol use: No   Drug use: Yes    Comment: prescribed oxy through pain clinic for back   Sexual  activity: Not on file  Other Topics Concern   Not on file  Social History Narrative   Married.   3 children, 11 grandchildren, 1 great grand child.   Retired. Worked at [employer], [place], [employer] [place], [place].   Enjoys spending time with her family.   Social Determinants of Health   Financial Resource Strain: Low Risk  (07/14/2021)   Overall Financial Resource Strain (CARDIA)    Difficulty of Paying Living Expenses: Not very hard  Food Insecurity: No Food Insecurity (07/14/2021)   Hunger Vital Sign    Worried About Running Out of Food in the Last Year: Never true    Ran Out of Food in the Last Year: Never true  Transportation Needs: No Transportation Needs (07/14/2021)   PRAPARE - Administrator, Civil Service (Medical): No    Lack of Transportation (Non-Medical): No  Physical Activity: Insufficiently Active (07/14/2021)   Exercise Vital Sign    Days of Exercise per Week: 1 day    Minutes of Exercise per Session: 30 min  Stress: No Stress Concern Present (07/14/2021)   [employer] of Occupational Health - Occupational Stress Questionnaire    Feeling of Stress : Not at all  Social Connections: Moderately Integrated (07/14/2021)   Social Connection and Isolation Panel [NHANES]    Frequency of Communication with Friends and  Family: More than three times a week    Frequency of Social Gatherings with Friends and Family: Not on file    Attends Religious Services: More than 4 times per year    Active Member of [employer] or Organizations: No    Attends Banker Meetings: Never    Marital Status: Married  Catering manager Violence: Not At Risk (07/14/2021)   Humiliation, Afraid, Rape, and Kick questionnaire    Fear of Current or Ex-Partner: No    Emotionally Abused: No    Physically Abused: No    Sexually Abused: No    Past Surgical History:  Procedure Laterality Date   BLADDER SUSPENSION     BUNIONECTOMY     CATARACT EXTRACTION Bilateral    HAMMER TOE  SURGERY     LAMINECTOMY     MANDIBLE FRACTURE SURGERY     MANDIBLE FRACTURE SURGERY     PARTIAL HYSTERECTOMY      Family History  Problem Relation Age of Onset   Stroke Mother    Hypertension Mother    Thyroid disease Mother    Cancer Father        Brain Tumor   Hypertension Sister    Thyroid disease Sister    Thyroid disease Brother    Heart disease Brother    Heart attack Brother    Thyroid disease Brother    Depression Brother    Thyroid disease Brother    Cancer Maternal Aunt    Cancer Maternal Grandmother    Diabetes Neg Hx     Allergies  Allergen Reactions   Codeine    Hydrocodone-Acetaminophen Other (See Comments)   Hydrocodone Nausea And Vomiting    Vicodin causes vomiting, but when takes Phenergan 0.25 mg with it, she can tolerate it.   Tramadol Nausea And Vomiting    Causes vomiting & affects her heart rhythm.    Current Outpatient Medications on File Prior to Visit  Medication Sig Dispense Refill   albuterol (VENTOLIN HFA) 108 (90 Base) MCG/ACT inhaler Inhale 2 puffs into the lungs every 6 (six) hours as needed for wheezing or shortness of breath. 8 g 0   azelastine (ASTELIN) 0.1 % nasal spray Place 1 spray into both nostrils 2 (two) times daily as needed for rhinitis. 30 mL 5   baclofen (LIORESAL) 10 MG tablet TAKE 1/2 TO 1 (ONE-HALF TO ONE) TABLET BY MOUTH TWICE DAILY     benzonatate (TESSALON) 200 MG capsule Take 1 capsule (200 mg total) by mouth 3 (three) times daily as needed for cough. 15 capsule 0   desloratadine (CLARINEX) 5 MG tablet Take 5 mg by mouth daily as needed (allergies).     gabapentin (NEURONTIN) 600 MG tablet Take 600 mg by mouth 3 (three) times daily.     lamoTRIgine (LAMICTAL) 150 MG tablet Take 150 mg by mouth every morning.     montelukast (SINGULAIR) 10 MG tablet Take 10 mg by mouth at bedtime as needed (Seasonal).     oxyCODONE-acetaminophen (PERCOCET/ROXICET) 5-325 MG tablet Take 1 tablet by mouth 2 (two) times daily as needed for  moderate pain.     sertraline (ZOLOFT) 100 MG tablet Take 100 mg by mouth in the morning.     No current facility-administered medications on file prior to visit.    BP 116/70   Pulse 70   Temp 98.1 F (36.7 C) (Temporal)   Ht 5\' 3"  (1.6 m)   Wt 160 lb (72.6 kg)   LMP  (LMP  Unknown)   SpO2 98%   BMI 28.34 kg/m  Objective:   Physical Exam HENT:     Right Ear: Tympanic membrane and ear canal normal.     Left Ear: Tympanic membrane and ear canal normal.     Nose: Nose normal.  Eyes:     Conjunctiva/sclera: Conjunctivae normal.     Pupils: Pupils are equal, round, and reactive to light.  Neck:     Thyroid: No thyromegaly.  Cardiovascular:     Rate and Rhythm: Normal rate and regular rhythm.     Heart sounds: No murmur heard. Pulmonary:     Effort: Pulmonary effort is normal.     Breath sounds: Normal breath sounds. No rales.  Abdominal:     General: Bowel sounds are normal.     Palpations: Abdomen is soft.     Tenderness: There is no abdominal tenderness.  Musculoskeletal:        General: Normal range of motion.     Cervical back: Neck supple.  Lymphadenopathy:     Cervical: No cervical adenopathy.  Skin:    General: Skin is warm and dry.     Findings: No rash.  Neurological:     Mental Status: She is alert and oriented to person, place, and time.     Cranial Nerves: No cranial nerve deficit.     Deep Tendon Reflexes: Reflexes are normal and symmetric.  Psychiatric:        Mood and Affect: Mood normal.           Assessment & Plan:  Preoperative clearance Assessment & Plan: Exam today stable. Reviewed labs from February 2024.  No history of cardiac problems. She is not on blood thinner treatment.   Cleared for surgery.           Doreene Nest, NP

## 2023-02-23 NOTE — Patient Instructions (Addendum)
You are cleared for surgery.  It was a pleasure to see you today!   

## 2023-02-23 NOTE — Assessment & Plan Note (Addendum)
Exam today stable. Reviewed labs from February 2024.  No history of cardiac problems. She is not on blood thinner treatment.   Cleared for surgery.

## 2023-02-24 ENCOUNTER — Ambulatory Visit
Admission: RE | Admit: 2023-02-24 | Discharge: 2023-02-24 | Disposition: A | Discharge status: Home / Self Care | Payer: PPO | Source: Ambulatory Visit | Attending: Primary Care | Admitting: Primary Care

## 2023-02-24 DIAGNOSIS — M8588 Other specified disorders of bone density and structure, other site: Secondary | ICD-10-CM | POA: Diagnosis not present

## 2023-02-24 DIAGNOSIS — E2839 Other primary ovarian failure: Secondary | ICD-10-CM | POA: Diagnosis not present

## 2023-02-24 DIAGNOSIS — Z1231 Encounter for screening mammogram for malignant neoplasm of breast: Secondary | ICD-10-CM

## 2023-02-24 DIAGNOSIS — Z780 Asymptomatic menopausal state: Secondary | ICD-10-CM | POA: Diagnosis not present

## 2023-03-01 DIAGNOSIS — M1711 Unilateral primary osteoarthritis, right knee: Secondary | ICD-10-CM | POA: Diagnosis not present

## 2023-03-01 DIAGNOSIS — M1712 Unilateral primary osteoarthritis, left knee: Secondary | ICD-10-CM | POA: Diagnosis not present

## 2023-03-01 DIAGNOSIS — M94261 Chondromalacia, right knee: Secondary | ICD-10-CM | POA: Diagnosis not present

## 2023-03-01 DIAGNOSIS — G8918 Other acute postprocedural pain: Secondary | ICD-10-CM | POA: Diagnosis not present

## 2023-03-01 DIAGNOSIS — S83281A Other tear of lateral meniscus, current injury, right knee, initial encounter: Secondary | ICD-10-CM | POA: Diagnosis not present

## 2023-03-01 DIAGNOSIS — S83231A Complex tear of medial meniscus, current injury, right knee, initial encounter: Secondary | ICD-10-CM | POA: Diagnosis not present

## 2023-05-07 DIAGNOSIS — M25562 Pain in left knee: Secondary | ICD-10-CM | POA: Diagnosis not present

## 2023-05-07 DIAGNOSIS — M533 Sacrococcygeal disorders, not elsewhere classified: Secondary | ICD-10-CM | POA: Diagnosis not present

## 2023-05-07 DIAGNOSIS — M47816 Spondylosis without myelopathy or radiculopathy, lumbar region: Secondary | ICD-10-CM | POA: Diagnosis not present

## 2023-05-07 DIAGNOSIS — G8929 Other chronic pain: Secondary | ICD-10-CM | POA: Diagnosis not present

## 2023-05-07 DIAGNOSIS — G894 Chronic pain syndrome: Secondary | ICD-10-CM | POA: Diagnosis not present

## 2023-05-07 DIAGNOSIS — M4722 Other spondylosis with radiculopathy, cervical region: Secondary | ICD-10-CM | POA: Diagnosis not present

## 2023-05-07 DIAGNOSIS — M961 Postlaminectomy syndrome, not elsewhere classified: Secondary | ICD-10-CM | POA: Diagnosis not present

## 2023-05-20 DIAGNOSIS — M4722 Other spondylosis with radiculopathy, cervical region: Secondary | ICD-10-CM | POA: Diagnosis not present

## 2023-05-20 DIAGNOSIS — G894 Chronic pain syndrome: Secondary | ICD-10-CM | POA: Diagnosis not present

## 2023-05-20 DIAGNOSIS — M47816 Spondylosis without myelopathy or radiculopathy, lumbar region: Secondary | ICD-10-CM | POA: Diagnosis not present

## 2023-05-20 DIAGNOSIS — M961 Postlaminectomy syndrome, not elsewhere classified: Secondary | ICD-10-CM | POA: Diagnosis not present

## 2023-05-20 DIAGNOSIS — M47817 Spondylosis without myelopathy or radiculopathy, lumbosacral region: Secondary | ICD-10-CM | POA: Diagnosis not present

## 2023-05-20 DIAGNOSIS — M25562 Pain in left knee: Secondary | ICD-10-CM | POA: Diagnosis not present

## 2023-05-20 DIAGNOSIS — M533 Sacrococcygeal disorders, not elsewhere classified: Secondary | ICD-10-CM | POA: Diagnosis not present

## 2023-05-31 DIAGNOSIS — M47816 Spondylosis without myelopathy or radiculopathy, lumbar region: Secondary | ICD-10-CM | POA: Diagnosis not present

## 2023-05-31 DIAGNOSIS — M2578 Osteophyte, vertebrae: Secondary | ICD-10-CM | POA: Diagnosis not present

## 2023-05-31 DIAGNOSIS — M5137 Other intervertebral disc degeneration, lumbosacral region: Secondary | ICD-10-CM | POA: Diagnosis not present

## 2023-05-31 DIAGNOSIS — M48061 Spinal stenosis, lumbar region without neurogenic claudication: Secondary | ICD-10-CM | POA: Diagnosis not present

## 2023-05-31 DIAGNOSIS — M533 Sacrococcygeal disorders, not elsewhere classified: Secondary | ICD-10-CM | POA: Diagnosis not present

## 2023-05-31 DIAGNOSIS — M5136 Other intervertebral disc degeneration, lumbar region: Secondary | ICD-10-CM | POA: Diagnosis not present

## 2023-06-04 DIAGNOSIS — M25562 Pain in left knee: Secondary | ICD-10-CM | POA: Diagnosis not present

## 2023-06-04 DIAGNOSIS — M25561 Pain in right knee: Secondary | ICD-10-CM | POA: Diagnosis not present

## 2023-06-23 ENCOUNTER — Ambulatory Visit (INDEPENDENT_AMBULATORY_CARE_PROVIDER_SITE_OTHER): Payer: PPO

## 2023-06-23 VITALS — Wt 154.0 lb

## 2023-06-23 DIAGNOSIS — Z0000 Encounter for general adult medical examination without abnormal findings: Secondary | ICD-10-CM

## 2023-06-23 NOTE — Progress Notes (Signed)
Subjective:   FALESHA HENRICK is a 72 y.o. female who presents for Medicare Annual (Subsequent) preventive examination.  Visit Complete: Virtual  I connected with  Linnell Fulling Gaby on 06/23/23 by a audio enabled telemedicine application and verified that I am speaking with the correct person using two identifiers.  Patient Location: Home  Provider Location: Home Office  I discussed the limitations of evaluation and management by telemedicine. The patient expressed understanding and agreed to proceed.   Review of Systems     Cardiac Risk Factors include: advanced age (>32men, >35 women);dyslipidemia     Objective:    Today's Vitals   06/23/23 1424  Weight: 154 lb (69.9 kg)   Body mass index is 27.28 kg/m.     06/23/2023    2:30 PM 01/17/2022    9:35 PM 07/14/2021    5:22 PM 10/23/2020    1:23 PM 10/27/2019    5:31 PM 03/28/2019   12:15 PM  Advanced Directives  Does Patient Have a Medical Advance Directive? No No No No No No  Would patient like information on creating a medical advance directive? No - Patient declined No - Patient declined Yes (MAU/Ambulatory/Procedural Areas - Information given) No - Patient declined No - Patient declined No - Patient declined    Current Medications (verified) Outpatient Encounter Medications as of 06/23/2023  Medication Sig   albuterol (VENTOLIN HFA) 108 (90 Base) MCG/ACT inhaler Inhale 2 puffs into the lungs every 6 (six) hours as needed for wheezing or shortness of breath.   azelastine (ASTELIN) 0.1 % nasal spray Place 1 spray into both nostrils 2 (two) times daily as needed for rhinitis.   baclofen (LIORESAL) 10 MG tablet TAKE 1/2 TO 1 (ONE-HALF TO ONE) TABLET BY MOUTH TWICE DAILY   benzonatate (TESSALON) 200 MG capsule Take 1 capsule (200 mg total) by mouth 3 (three) times daily as needed for cough.   desloratadine (CLARINEX) 5 MG tablet Take 5 mg by mouth daily as needed (allergies).   gabapentin (NEURONTIN) 600 MG tablet Take 600 mg  by mouth 3 (three) times daily.   lamoTRIgine (LAMICTAL) 150 MG tablet Take 150 mg by mouth every morning.   montelukast (SINGULAIR) 10 MG tablet Take 10 mg by mouth at bedtime as needed (Seasonal).   oxyCODONE-acetaminophen (PERCOCET/ROXICET) 5-325 MG tablet Take 1 tablet by mouth 2 (two) times daily as needed for moderate pain.   sertraline (ZOLOFT) 100 MG tablet Take 100 mg by mouth in the morning.   [DISCONTINUED] oxyCODONE-acetaminophen (PERCOCET/ROXICET) 5-325 MG tablet Take by mouth.   No facility-administered encounter medications on file as of 06/23/2023.    Allergies (verified) Codeine, Hydrocodone-acetaminophen, Hydrocodone, and Tramadol   History: Past Medical History:  Diagnosis Date   Acute colitis 10/23/2020   Acute medial meniscus tear    left   Acute non-recurrent maxillary sinusitis 12/01/2022   Allergic rhinitis    Anxiety    Anxiety and depression    Asthma    Atrophic vaginitis    Cataracts, bilateral    Cervical radiculopathy    Degenerative disc disease, lumbar    Insomnia    Renal disorder    kidney stones   Urinary incontinence    Past Surgical History:  Procedure Laterality Date   BLADDER SUSPENSION     BUNIONECTOMY     CATARACT EXTRACTION Bilateral    HAMMER TOE SURGERY     LAMINECTOMY     MANDIBLE FRACTURE SURGERY     MANDIBLE FRACTURE SURGERY  PARTIAL HYSTERECTOMY     Family History  Problem Relation Age of Onset   Stroke Mother    Hypertension Mother    Thyroid disease Mother    Cancer Father        Brain Tumor   Hypertension Sister    Thyroid disease Sister    Cancer Maternal Aunt    Cancer Maternal Grandmother    Thyroid disease Brother    Heart disease Brother    Heart attack Brother    Thyroid disease Brother    Depression Brother    Thyroid disease Brother    Diabetes Neg Hx    Breast cancer Neg Hx    Social History   Socioeconomic History   Marital status: Married    Spouse name: Not on file   Number of  children: Not on file   Years of education: Not on file   Highest education level: Not on file  Occupational History   Not on file  Tobacco Use   Smoking status: Never   Smokeless tobacco: Never  Vaping Use   Vaping status: Never Used  Substance and Sexual Activity   Alcohol use: No   Drug use: Yes    Comment: prescribed oxy through pain clinic for back   Sexual activity: Not on file  Other Topics Concern   Not on file  Social History Narrative   Married.   3 children, 11 grandchildren, 1 great grand child.   Retired. Worked at [employer], [place], [place], [place].   Enjoys spending time with her family.   Social Determinants of Health   Financial Resource Strain: Low Risk  (06/23/2023)   Overall Financial Resource Strain (CARDIA)    Difficulty of Paying Living Expenses: Not hard at all  Food Insecurity: No Food Insecurity (06/23/2023)   Hunger Vital Sign    Worried About Running Out of Food in the Last Year: Never true    Ran Out of Food in the Last Year: Never true  Transportation Needs: No Transportation Needs (06/23/2023)   PRAPARE - Administrator, Civil Service (Medical): No    Lack of Transportation (Non-Medical): No  Physical Activity: Insufficiently Active (06/23/2023)   Exercise Vital Sign    Days of Exercise per Week: 1 day    Minutes of Exercise per Session: 30 min  Stress: No Stress Concern Present (06/23/2023)   [employer] of Occupational Health - Occupational Stress Questionnaire    Feeling of Stress : Not at all  Social Connections: Moderately Integrated (06/23/2023)   Social Connection and Isolation Panel [NHANES]    Frequency of Communication with Friends and Family: More than three times a week    Frequency of Social Gatherings with Friends and Family: More than three times a week    Attends Religious Services: More than 4 times per year    Active Member of [employer] or Organizations: No    Attends Engineer, structural: Never     Marital Status: Married    Tobacco Counseling Counseling given: Not Answered   Clinical Intake:  Pre-visit preparation completed: Yes  Pain : No/denies pain     BMI - recorded: 27.28 Nutritional Status: BMI 25 -29 Overweight Nutritional Risks: None Diabetes: No  How often do you need to have someone help you when you read instructions, pamphlets, or other written materials from your doctor or pharmacy?: 1 - Never  Interpreter Needed?: No  Information entered by :: Lanier Ensign, LPN   Activities of  Daily Living    06/23/2023    2:26 PM  In your present state of health, do you have any difficulty performing the following activities:  Hearing? 0  Vision? 0  Difficulty concentrating or making decisions? 0  Walking or climbing stairs? 0  Dressing or bathing? 0  Doing errands, shopping? 0  Preparing Food and eating ? N  Using the Toilet? N  In the past six months, have you accidently leaked urine? Y  Comment at times  Do you have problems with loss of bowel control? N  Managing your Medications? N  Managing your Finances? N  Housekeeping or managing your Housekeeping? N    Patient Care Team: Doreene Nest, NP as PCP - General (Internal Medicine) Darliss Ridgel, MD as Referring Physician (Psychiatry) Oda Kilts Ammie Ferrier, MD as Referring Physician (Anesthesiology) Kathyrn Sheriff, [facility] (Inactive) as Pharmacist (Pharmacist)  Indicate any recent Medical Services you may have received from other than [facility] providers in the past year (date may be approximate).     Assessment:   This is a routine wellness examination for Anissa.  Hearing/Vision screen Hearing Screening - Comments:: Pt denies any hearing issues  Vision Screening - Comments:: Pt follows up with Dr Senaida Ores for annual eye exams  Dietary issues and exercise activities discussed:     Goals Addressed             This Visit's Progress    Patient Stated       Stay active and healthy        Depression Screen    06/23/2023    2:28 PM 02/23/2023    2:40 PM 01/28/2023    2:35 PM 12/17/2022   12:09 PM 12/01/2022    3:14 PM 07/14/2021    5:25 PM 11/05/2020    9:55 AM  PHQ 2/9 Scores  PHQ - 2 Score 0 0 0 0 0 0 0  PHQ- 9 Score  0 0    0    Fall Risk    06/23/2023    2:30 PM 02/23/2023    2:40 PM 01/28/2023    2:35 PM 12/17/2022   12:09 PM 12/01/2022    3:14 PM  Fall Risk   Falls in the past year? 0 0 0 0 0  Number falls in past yr: 0 0 0 0 0  Injury with Fall? 0 0 0 0 0  Risk for fall due to : Impaired vision No Fall Risks No Fall Risks No Fall Risks No Fall Risks  Follow up Falls prevention discussed Falls evaluation completed Falls evaluation completed Falls evaluation completed Falls evaluation completed    MEDICARE RISK AT HOME: Medicare Risk at Home Any stairs in or around the home?: Yes If so, are there any without handrails?: No Home free of loose throw rugs in walkways, pet beds, electrical cords, etc?: Yes Adequate lighting in your home to reduce risk of falls?: Yes Life alert?: No Use of a cane, walker or w/c?: No Grab bars in the bathroom?: No Shower chair or bench in shower?: No Elevated toilet seat or a handicapped toilet?: No  TIMED UP AND GO:  Was the test performed?  No    Cognitive Function:    03/28/2019   12:16 PM 09/22/2016    8:37 AM  MMSE - Mini Mental State Exam  Orientation to time 5 5  Orientation to Place 5 5  Registration 3 3  Attention/ Calculation 0 5  Recall 3 3  Language-  name 2 objects 0 2  Language- repeat 1 1  Language- follow 3 step command 0 3  Language- read & follow direction 0 1  Write a sentence 0 1  Copy design 0 1  Total score 17 30        06/23/2023    2:31 PM 07/14/2021    5:25 PM  6CIT Screen  What Year? 0 points 0 points  What month? 0 points   What time? 0 points 0 points  Count back from 20 0 points 0 points  Months in reverse 0 points 2 points  Repeat phrase 0 points 0 points  Total Score 0  points     Immunizations Immunization History  Administered Date(s) Administered   Fluad Quad(high Dose 65+) 07/07/2019, 07/24/2020   Influenza, High Dose Seasonal PF 09/22/2016, 12/23/2021   Influenza,inj,Quad PF,6+ Mos 12/18/2015, 12/17/2017, 09/21/2018   Influenza,inj,quad, With Preservative 12/23/2017   Influenza-Unspecified 07/19/2014   PFIZER(Purple Top)SARS-COV-2 Vaccination 01/26/2020, 02/20/2020, 08/09/2020   Pneumococcal Conjugate-13 09/22/2016   Pneumococcal Polysaccharide-23 03/31/2019, 08/15/2021   Td 10/26/2008   Zoster, Live 07/09/2014    TDAP status: Due, Education has been provided regarding the importance of this vaccine. Advised may receive this vaccine at local pharmacy or Health Dept. Aware to provide a copy of the vaccination record if obtained from local pharmacy or Health Dept. Verbalized acceptance and understanding.  Flu Vaccine status: Due, Education has been provided regarding the importance of this vaccine. Advised may receive this vaccine at local pharmacy or Health Dept. Aware to provide a copy of the vaccination record if obtained from local pharmacy or Health Dept. Verbalized acceptance and understanding.  Pneumococcal vaccine status: Up to date  Covid-19 vaccine status: Declined, Education has been provided regarding the importance of this vaccine but patient still declined. Advised may receive this vaccine at local pharmacy or Health Dept.or vaccine clinic. Aware to provide a copy of the vaccination record if obtained from local pharmacy or Health Dept. Verbalized acceptance and understanding.  Qualifies for Shingles Vaccine? Yes   Zostavax completed No   Shingrix Completed?: No.    Education has been provided regarding the importance of this vaccine. Patient has been advised to call insurance company to determine out of pocket expense if they have not yet received this vaccine. Advised may also receive vaccine at local pharmacy or Health Dept.  Verbalized acceptance and understanding.  Screening Tests Health Maintenance  Topic Date Due   Zoster Vaccines- Shingrix (1 of 2) 09/03/1970   COVID-19 Vaccine (4 - 2023-24 season) 06/26/2022   INFLUENZA VACCINE  05/27/2023   Medicare Annual Wellness (AWV)  06/22/2024   MAMMOGRAM  02/23/2025   Colonoscopy  06/04/2031   Pneumonia Vaccine 43+ Years old  Completed   DEXA SCAN  Completed   Hepatitis C Screening  Completed   HPV VACCINES  Aged Out   DTaP/Tdap/Td  Discontinued    Health Maintenance  Health Maintenance Due  Topic Date Due   Zoster Vaccines- Shingrix (1 of 2) 09/03/1970   COVID-19 Vaccine (4 - 2023-24 season) 06/26/2022   INFLUENZA VACCINE  05/27/2023    Colorectal cancer screening: Type of screening: Colonoscopy. Completed 06/03/21. Repeat every 10 years  Mammogram status: Completed 02/24/23. Repeat every year  Bone Density status: Completed 02/24/23. Results reflect: Bone density results: OSTEOPENIA. Repeat every 2 years.   Additional Screening:  Hepatitis C Screening: Completed 09/22/16  Vision Screening: Recommended annual ophthalmology exams for early detection of glaucoma and other disorders of the eye.  Is the patient up to date with their annual eye exam?  Yes  Who is the provider or what is the name of the office in which the patient attends annual eye exams? Dr Senaida Ores If pt is not established with a provider, would they like to be referred to a provider to establish care? No .   Dental Screening: Recommended annual dental exams for proper oral hygiene   Community Resource Referral / Chronic Care Management: CRR required this visit?  No   CCM required this visit?  No     Plan:     I have personally reviewed and noted the following in the patient's chart:   Medical and social history Use of alcohol, tobacco or illicit drugs  Current medications and supplements including opioid prescriptions. Patient is currently taking opioid prescriptions.  Information provided to patient regarding non-opioid alternatives. Patient advised to discuss non-opioid treatment plan with their provider. Functional ability and status Nutritional status Physical activity Advanced directives List of other physicians Hospitalizations, surgeries, and ER visits in previous 12 months Vitals Screenings to include cognitive, depression, and falls Referrals and appointments  In addition, I have reviewed and discussed with patient certain preventive protocols, quality metrics, and best practice recommendations. A written personalized care plan for preventive services as well as general preventive health recommendations were provided to patient.     Marzella Schlein, LPN   1/61/0960   After Visit Summary: (Declined) Due to this being a telephonic visit, with patients personalized plan was offered to patient but patient Declined AVS at this time   Nurse Notes: none

## 2023-06-23 NOTE — Patient Instructions (Signed)
Ms. Juncker , Thank you for taking time to come for your Medicare Wellness Visit. I appreciate your ongoing commitment to your health goals. Please review the following plan we discussed and let me know if I can assist you in the future.   Referrals/Orders/Follow-Ups/Clinician Recommendations: stay healthy and active   This is a list of the screening recommended for you and due dates:  Health Maintenance  Topic Date Due   Zoster (Shingles) Vaccine (1 of 2) 09/03/1970   COVID-19 Vaccine (4 - 2023-24 season) 06/26/2022   Medicare Annual Wellness Visit  07/14/2022   Flu Shot  05/27/2023   Mammogram  02/23/2025   Colon Cancer Screening  06/04/2031   Pneumonia Vaccine  Completed   DEXA scan (bone density measurement)  Completed   Hepatitis C Screening  Completed   HPV Vaccine  Aged Out   DTaP/Tdap/Td vaccine  Discontinued    Advanced directives: (Declined) Advance directive discussed with you today. Even though you declined this today, please call our office should you change your mind, and we can give you the proper paperwork for you to fill out.  Next Medicare Annual Wellness Visit scheduled for next year: Yes

## 2023-07-06 DIAGNOSIS — M48062 Spinal stenosis, lumbar region with neurogenic claudication: Secondary | ICD-10-CM | POA: Diagnosis not present

## 2023-08-02 DIAGNOSIS — G894 Chronic pain syndrome: Secondary | ICD-10-CM | POA: Diagnosis not present

## 2023-08-02 DIAGNOSIS — M961 Postlaminectomy syndrome, not elsewhere classified: Secondary | ICD-10-CM | POA: Diagnosis not present

## 2023-08-02 DIAGNOSIS — M25562 Pain in left knee: Secondary | ICD-10-CM | POA: Diagnosis not present

## 2023-08-02 DIAGNOSIS — M4722 Other spondylosis with radiculopathy, cervical region: Secondary | ICD-10-CM | POA: Diagnosis not present

## 2023-08-02 DIAGNOSIS — Z0289 Encounter for other administrative examinations: Secondary | ICD-10-CM | POA: Diagnosis not present

## 2023-08-02 DIAGNOSIS — G8929 Other chronic pain: Secondary | ICD-10-CM | POA: Diagnosis not present

## 2023-08-02 DIAGNOSIS — M47816 Spondylosis without myelopathy or radiculopathy, lumbar region: Secondary | ICD-10-CM | POA: Diagnosis not present

## 2023-08-02 DIAGNOSIS — M533 Sacrococcygeal disorders, not elsewhere classified: Secondary | ICD-10-CM | POA: Diagnosis not present

## 2023-08-20 DIAGNOSIS — R053 Chronic cough: Secondary | ICD-10-CM | POA: Diagnosis not present

## 2023-08-20 DIAGNOSIS — J3089 Other allergic rhinitis: Secondary | ICD-10-CM | POA: Diagnosis not present

## 2023-08-20 DIAGNOSIS — J301 Allergic rhinitis due to pollen: Secondary | ICD-10-CM | POA: Diagnosis not present

## 2023-08-20 DIAGNOSIS — J3081 Allergic rhinitis due to animal (cat) (dog) hair and dander: Secondary | ICD-10-CM | POA: Diagnosis not present

## 2023-10-14 DIAGNOSIS — M25562 Pain in left knee: Secondary | ICD-10-CM | POA: Diagnosis not present

## 2023-10-14 DIAGNOSIS — M533 Sacrococcygeal disorders, not elsewhere classified: Secondary | ICD-10-CM | POA: Diagnosis not present

## 2023-10-14 DIAGNOSIS — M4722 Other spondylosis with radiculopathy, cervical region: Secondary | ICD-10-CM | POA: Diagnosis not present

## 2023-10-14 DIAGNOSIS — M47812 Spondylosis without myelopathy or radiculopathy, cervical region: Secondary | ICD-10-CM | POA: Diagnosis not present

## 2023-10-14 DIAGNOSIS — G8929 Other chronic pain: Secondary | ICD-10-CM | POA: Diagnosis not present

## 2023-10-14 DIAGNOSIS — G894 Chronic pain syndrome: Secondary | ICD-10-CM | POA: Diagnosis not present

## 2023-10-14 DIAGNOSIS — M961 Postlaminectomy syndrome, not elsewhere classified: Secondary | ICD-10-CM | POA: Diagnosis not present

## 2023-10-14 DIAGNOSIS — M47816 Spondylosis without myelopathy or radiculopathy, lumbar region: Secondary | ICD-10-CM | POA: Diagnosis not present

## 2023-10-21 DIAGNOSIS — M170 Bilateral primary osteoarthritis of knee: Secondary | ICD-10-CM | POA: Diagnosis not present

## 2023-12-03 ENCOUNTER — Ambulatory Visit (INDEPENDENT_AMBULATORY_CARE_PROVIDER_SITE_OTHER): Payer: PPO | Admitting: Primary Care

## 2023-12-03 VITALS — BP 136/80 | HR 85 | Temp 97.2°F | Ht 63.0 in

## 2023-12-03 DIAGNOSIS — K047 Periapical abscess without sinus: Secondary | ICD-10-CM | POA: Diagnosis not present

## 2023-12-03 MED ORDER — AMOXICILLIN 875 MG PO TABS
875.0000 mg | ORAL_TABLET | Freq: Two times a day (BID) | ORAL | 0 refills | Status: AC
Start: 2023-12-03 — End: 2023-12-10

## 2023-12-03 NOTE — Progress Notes (Signed)
 Subjective:    Patient ID: Sharon Collins, female    DOB: Jan 05, 1951, 73 y.o.   MRN: [id number]  HPI  Sharon Collins is a very pleasant 73 y.o. female with a history of allergic rhinitis, prediabetes, neck mass who presents today to discuss facial swelling.   Her facial swelling began one week ago and is located to the left lower jaw. She's noticed a swollen lymph node to the site which is tender.   She has a dental implant to the left molar region without the tooth which needs to be removed as it's hitting a nerve. She's been unable to get the implant removed due to family illness. She is scheduled to see her dentist in March.   She denies fevers, erythema, post nasal drip.     Review of Systems  Constitutional:  Negative for fever.  HENT:  Positive for dental problem. Negative for congestion, ear pain and sore throat.   Respiratory:  Negative for shortness of breath.          Past Medical History:  Diagnosis Date   Acute colitis 10/23/2020   Acute medial meniscus tear    left   Acute non-recurrent maxillary sinusitis 12/01/2022   Allergic rhinitis    Anxiety    Anxiety and depression    Asthma    Atrophic vaginitis    Cataracts, bilateral    Cervical radiculopathy    Degenerative disc disease, lumbar    Insomnia    Renal disorder    kidney stones   Urinary incontinence     Social History   Socioeconomic History   Marital status: Married    Spouse name: Not on file   Number of children: Not on file   Years of education: Not on file   Highest education level: Not on file  Occupational History   Not on file  Tobacco Use   Smoking status: Never   Smokeless tobacco: Never  Vaping Use   Vaping status: Never Used  Substance and Sexual Activity   Alcohol use: No   Drug use: Yes    Comment: prescribed oxy through pain clinic for back   Sexual activity: Not on file  Other Topics Concern   Not on file  Social History Narrative   Married.   3 children,  11 grandchildren, 1 great grand child.   Retired. Worked at [employer], [place], [employer] [place], [place].   Enjoys spending time with her family.   Social Drivers of Corporate Investment Banker Strain: Low Risk  (06/23/2023)   Overall Financial Resource Strain (CARDIA)    Difficulty of Paying Living Expenses: Not hard at all  Food Insecurity: No Food Insecurity (06/23/2023)   Hunger Vital Sign    Worried About Running Out of Food in the Last Year: Never true    Ran Out of Food in the Last Year: Never true  Transportation Needs: No Transportation Needs (06/23/2023)   PRAPARE - Administrator, Civil Service (Medical): No    Lack of Transportation (Non-Medical): No  Physical Activity: Insufficiently Active (06/23/2023)   Exercise Vital Sign    Days of Exercise per Week: 1 day    Minutes of Exercise per Session: 30 min  Stress: No Stress Concern Present (06/23/2023)   [employer] of Occupational Health - Occupational Stress Questionnaire    Feeling of Stress : Not at all  Social Connections: Moderately Integrated (06/23/2023)   Social Connection and Isolation Panel [NHANES]  Frequency of Communication with Friends and Family: More than three times a week    Frequency of Social Gatherings with Friends and Family: More than three times a week    Attends Religious Services: More than 4 times per year    Active Member of [employer] or Organizations: No    Attends Banker Meetings: Never    Marital Status: Married  Catering Manager Violence: Not At Risk (06/23/2023)   Humiliation, Afraid, Rape, and Kick questionnaire    Fear of Current or Ex-Partner: No    Emotionally Abused: No    Physically Abused: No    Sexually Abused: No    Past Surgical History:  Procedure Laterality Date   BLADDER SUSPENSION     BUNIONECTOMY     CATARACT EXTRACTION Bilateral    HAMMER TOE SURGERY     LAMINECTOMY     MANDIBLE FRACTURE SURGERY     MANDIBLE FRACTURE SURGERY     PARTIAL  HYSTERECTOMY      Family History  Problem Relation Age of Onset   Stroke Mother    Hypertension Mother    Thyroid  disease Mother    Cancer Father        Brain Tumor   Hypertension Sister    Thyroid  disease Sister    Cancer Maternal Aunt    Cancer Maternal Grandmother    Thyroid  disease Brother    Heart disease Brother    Heart attack Brother    Thyroid  disease Brother    Depression Brother    Thyroid  disease Brother    Diabetes Neg Hx    Breast cancer Neg Hx     Allergies  Allergen Reactions   Codeine    Hydrocodone -Acetaminophen  Other (See Comments)   Hydrocodone  Nausea And Vomiting    Vicodin causes vomiting, but when takes Phenergan 0.25 mg with it, she can tolerate it.   Tramadol Nausea And Vomiting    Causes vomiting & affects her heart rhythm.    Current Outpatient Medications on File Prior to Visit  Medication Sig Dispense Refill   albuterol  (VENTOLIN  HFA) 108 (90 Base) MCG/ACT inhaler Inhale 2 puffs into the lungs every 6 (six) hours as needed for wheezing or shortness of breath. 8 g 0   azelastine  (ASTELIN ) 0.1 % nasal spray Place 1 spray into both nostrils 2 (two) times daily as needed for rhinitis. 30 mL 5   baclofen (LIORESAL) 10 MG tablet TAKE 1/2 TO 1 (ONE-HALF TO ONE) TABLET BY MOUTH TWICE DAILY     benzonatate  (TESSALON ) 200 MG capsule Take 1 capsule (200 mg total) by mouth 3 (three) times daily as needed for cough. 15 capsule 0   desloratadine  (CLARINEX ) 5 MG tablet Take 5 mg by mouth daily as needed (allergies).     gabapentin  (NEURONTIN ) 600 MG tablet Take 600 mg by mouth 3 (three) times daily.     lamoTRIgine  (LAMICTAL ) 150 MG tablet Take 150 mg by mouth every morning.     montelukast  (SINGULAIR ) 10 MG tablet Take 10 mg by mouth at bedtime as needed (Seasonal).     oxyCODONE -acetaminophen  (PERCOCET/ROXICET) 5-325 MG tablet Take 1 tablet by mouth 2 (two) times daily as needed for moderate pain.     sertraline  (ZOLOFT ) 100 MG tablet Take 100 mg by mouth  in the morning.     No current facility-administered medications on file prior to visit.    BP 136/80   Pulse 85   Temp (!) 97.2 F (36.2 C) (Temporal)  Ht 5' 3 (1.6 m)   LMP  (LMP Unknown)   SpO2 98%   BMI 27.28 kg/m  Objective:   Physical Exam Constitutional:      Appearance: She is not ill-appearing.  HENT:     Head:     Jaw: Swelling present. No tenderness.     Comments: Moderate swelling to left jaw line involving cheek. Not involving parotid gland.    Mouth/Throat:     Mouth: Mucous membranes are moist.     Dentition: No dental tenderness or gingival swelling.  Neck:     Comments: Enlarged lymph node to left lateral submandibular region. Cardiovascular:     Rate and Rhythm: Normal rate and regular rhythm.  Pulmonary:     Effort: Pulmonary effort is normal.     Breath sounds: Normal breath sounds.  Lymphadenopathy:     Cervical: Cervical adenopathy present.           Assessment & Plan:  Dental infection Assessment & Plan: HPI and exam representative.  Start amoxicillin  875 mg BID x 7 days. Follow up with dentist as scheduled.      Orders: -     Amoxicillin ; Take 1 tablet (875 mg total) by mouth 2 (two) times daily for 7 days.  Dispense: 14 tablet; Refill: 0        Comer MARLA Gaskins, NP

## 2023-12-03 NOTE — Patient Instructions (Signed)
 Start amoxicillin  antibiotics for the infection. Take 1 tablet by mouth twice daily for 7 days.  It was a pleasure to see you today!

## 2023-12-03 NOTE — Assessment & Plan Note (Signed)
 HPI and exam representative.  Start amoxicillin  875 mg BID x 7 days. Follow up with dentist as scheduled.

## 2024-01-04 DIAGNOSIS — M533 Sacrococcygeal disorders, not elsewhere classified: Secondary | ICD-10-CM | POA: Diagnosis not present

## 2024-01-04 DIAGNOSIS — G8929 Other chronic pain: Secondary | ICD-10-CM | POA: Diagnosis not present

## 2024-01-04 DIAGNOSIS — M961 Postlaminectomy syndrome, not elsewhere classified: Secondary | ICD-10-CM | POA: Diagnosis not present

## 2024-01-04 DIAGNOSIS — G894 Chronic pain syndrome: Secondary | ICD-10-CM | POA: Diagnosis not present

## 2024-01-04 DIAGNOSIS — M47816 Spondylosis without myelopathy or radiculopathy, lumbar region: Secondary | ICD-10-CM | POA: Diagnosis not present

## 2024-01-04 DIAGNOSIS — M25562 Pain in left knee: Secondary | ICD-10-CM | POA: Diagnosis not present

## 2024-01-04 DIAGNOSIS — M4722 Other spondylosis with radiculopathy, cervical region: Secondary | ICD-10-CM | POA: Diagnosis not present

## 2024-04-20 DIAGNOSIS — Z79891 Long term (current) use of opiate analgesic: Secondary | ICD-10-CM | POA: Diagnosis not present

## 2024-04-20 DIAGNOSIS — M961 Postlaminectomy syndrome, not elsewhere classified: Secondary | ICD-10-CM | POA: Diagnosis not present

## 2024-04-20 DIAGNOSIS — Z791 Long term (current) use of non-steroidal anti-inflammatories (NSAID): Secondary | ICD-10-CM | POA: Diagnosis not present

## 2024-04-20 DIAGNOSIS — Z0289 Encounter for other administrative examinations: Secondary | ICD-10-CM | POA: Diagnosis not present

## 2024-04-20 DIAGNOSIS — M4807 Spinal stenosis, lumbosacral region: Secondary | ICD-10-CM | POA: Diagnosis not present

## 2024-04-20 DIAGNOSIS — M533 Sacrococcygeal disorders, not elsewhere classified: Secondary | ICD-10-CM | POA: Diagnosis not present

## 2024-04-20 DIAGNOSIS — Z885 Allergy status to narcotic agent status: Secondary | ICD-10-CM | POA: Diagnosis not present

## 2024-04-20 DIAGNOSIS — M25562 Pain in left knee: Secondary | ICD-10-CM | POA: Diagnosis not present

## 2024-04-20 DIAGNOSIS — G894 Chronic pain syndrome: Secondary | ICD-10-CM | POA: Diagnosis not present

## 2024-04-20 DIAGNOSIS — R202 Paresthesia of skin: Secondary | ICD-10-CM | POA: Diagnosis not present

## 2024-04-20 DIAGNOSIS — M25561 Pain in right knee: Secondary | ICD-10-CM | POA: Diagnosis not present

## 2024-04-20 DIAGNOSIS — M47812 Spondylosis without myelopathy or radiculopathy, cervical region: Secondary | ICD-10-CM | POA: Diagnosis not present

## 2024-04-20 DIAGNOSIS — M47816 Spondylosis without myelopathy or radiculopathy, lumbar region: Secondary | ICD-10-CM | POA: Diagnosis not present

## 2024-04-20 DIAGNOSIS — F419 Anxiety disorder, unspecified: Secondary | ICD-10-CM | POA: Diagnosis not present

## 2024-04-24 ENCOUNTER — Other Ambulatory Visit (HOSPITAL_COMMUNITY): Payer: Self-pay

## 2024-06-30 ENCOUNTER — Ambulatory Visit: Payer: Self-pay | Admitting: *Deleted

## 2024-06-30 ENCOUNTER — Encounter: Payer: Self-pay | Admitting: Nurse Practitioner

## 2024-06-30 ENCOUNTER — Ambulatory Visit (INDEPENDENT_AMBULATORY_CARE_PROVIDER_SITE_OTHER): Admitting: Nurse Practitioner

## 2024-06-30 VITALS — BP 108/62 | HR 82 | Temp 97.8°F | Resp 16 | Ht 63.0 in | Wt 155.4 lb

## 2024-06-30 DIAGNOSIS — K047 Periapical abscess without sinus: Secondary | ICD-10-CM | POA: Diagnosis not present

## 2024-06-30 MED ORDER — AMOXICILLIN-POT CLAVULANATE 875-125 MG PO TABS: 1.0000 | ORAL_TABLET | Freq: Two times a day (BID) | ORAL | 0 refills | Status: AC

## 2024-06-30 NOTE — Telephone Encounter (Signed)
 FYI Only or Action Required?: FYI only for provider.  Patient was last seen in primary care on 12/03/2023 by Gretta Comer POUR, NP.  Called Nurse Triage reporting Jaw Pain (Left implant has slipped- causing pain, tingling, swelling).  Symptoms began several days ago.  Interventions attempted: Nothing.  Symptoms are: gradually worsening.  Triage Disposition: See PCP When Office is Open (Within 3 Days)  Patient/caregiver understands and will follow disposition?: Yes   Reason for Disposition  [1] MILD-MODERATE mouth pain AND [2] present > 3 days  Answer Assessment - Initial Assessment Questions 1. ONSET: When did your mouth start hurting? (e.g., hours or days ago)      Chronic- on/off- second occurrence  2. SEVERITY: How bad is the pain? (Scale 1-10; or mild, moderate or severe)     Tight with swelling- numbness in jaw 3. SORES: Are there any sores or ulcers in the mouth? If Yes, ask: What part of the mouth are the sores in?     no 4. FEVER: Do you have a fever? If Yes, ask: What is your temperature, how was it measured, and when did it start?     no 5. CAUSE: What do you think is causing the mouth pain?     Implant in jaw- effecting the nerves in jaw 6. OTHER SYMPTOMS: Do you have any other symptoms? (e.g., difficulty breathing)     Swelling, pain with wearing partial- patient states she needs to have implant removed- but can not financially. She is requesting antibiotic.  Protocols used: Mouth Pain-A-AH   Copied from CRM [id number]. Topic: Clinical - Red Word Triage >> Jun 30, 2024  8:19 AM Robinson H wrote: [facility] that prompted transfer to Nurse Triage: Has implant in left side lower jaw has slipped, some swelling in jaw and some pain, kind of numb

## 2024-06-30 NOTE — Progress Notes (Signed)
 BP 108/62   Pulse 82   Temp 97.8 F (36.6 C)   Resp 16   Ht 5' 3 (1.6 m)   Wt 155 lb 6.4 oz (70.5 kg)   LMP  (LMP Unknown)   SpO2 97%   BMI 27.53 kg/m    Subjective:    Patient ID: Kendell JINNY Brighter, female    DOB: [birth year], 73 y.o.   MRN: [id number]  HPI: BIRDA DIDONATO is a 73 y.o. female  Chief Complaint  Patient presents with   Dental Pain    Left side jaw with swelling   Discussed the use of AI scribe software for clinical note transcription with the patient, who gave verbal consent to proceed.  History of Present Illness Jozy Mcphearson is a 73 year old female who presents with dental pain on the left side of her jaw.  Left mandibular dental pain and swelling - Dental pain localized to the left side of the jaw - Presence of a dental implant on the bottom left side of the jaw, in place for an extended period - Irritation and swelling associated with the implant, worsened by use of the bottom partial denture - Swelling present since Wednesday - No fever  Barriers to dental care - Unable to visit the dentist due to caregiving responsibilities for husband with stomach cancer and 87 year old mother-in-law           06/23/2023    2:28 PM 02/23/2023    2:40 PM 01/28/2023    2:35 PM  Depression screen PHQ 2/9  Decreased Interest 0 0 0  Down, Depressed, Hopeless 0 0 0  PHQ - 2 Score 0 0 0  Altered sleeping  0 0  Tired, decreased energy  0 0  Change in appetite  0 0  Feeling bad or failure about yourself   0 0  Trouble concentrating  0 0  Moving slowly or fidgety/restless  0 0  Suicidal thoughts  0 0  PHQ-9 Score  0 0  Difficult doing work/chores  Not difficult at all Not difficult at all    Relevant past medical, surgical, family and social history reviewed and updated as indicated. Interim medical history since our last visit reviewed. Allergies and medications reviewed and updated.  Review of Systems  Ten systems reviewed and is negative  except as mentioned in HPI      Objective:      BP 108/62   Pulse 82   Temp 97.8 F (36.6 C)   Resp 16   Ht 5' 3 (1.6 m)   Wt 155 lb 6.4 oz (70.5 kg)   LMP  (LMP Unknown)   SpO2 97%   BMI 27.53 kg/m    Wt Readings from Last 3 Encounters:  06/30/24 155 lb 6.4 oz (70.5 kg)  06/23/23 154 lb (69.9 kg)  02/23/23 160 lb (72.6 kg)    Physical Exam GENERAL: Alert, cooperative, well developed, no acute distress. HEENT: Normocephalic, normal oropharynx, moist mucous membranes. Swelling on left side of jaw visible externally. CHEST: Clear to auscultation bilaterally. No wheezes, rhonchi, or crackles. CARDIOVASCULAR: Normal heart rate and rhythm. S1 and S2 normal without murmurs. ABDOMEN: Soft, non-tender, non-distended, without organomegaly. Normal bowel sounds. EXTREMITIES: No cyanosis or edema. NEUROLOGICAL: Cranial nerves grossly intact. Moves all extremities without gross motor or sensory deficit.  Results for orders placed or performed in visit on 12/17/22  Lipid panel   Collection Time: 12/17/22 12:45 PM  Result Value Ref Range  Cholesterol 246 (H) 0 - 200 mg/dL   Triglycerides 24.9 0.0 - 149.0 mg/dL   HDL 20.89 >60.99 mg/dL   VLDL 84.9 0.0 - 59.9 mg/dL   LDL Cholesterol 847 (H) 0 - 99 mg/dL   Total CHOL/HDL Ratio 3    NonHDL 167.39   Comprehensive metabolic panel   Collection Time: 12/17/22 12:45 PM  Result Value Ref Range   Sodium 138 135 - 145 mEq/L   Potassium 4.3 3.5 - 5.1 mEq/L   Chloride 101 96 - 112 mEq/L   CO2 30 19 - 32 mEq/L   Glucose, Bld 87 70 - 99 mg/dL   BUN 14 6 - 23 mg/dL   Creatinine, Ser 9.16 0.40 - 1.20 mg/dL   Total Bilirubin 0.4 0.2 - 1.2 mg/dL   Alkaline Phosphatase 61 39 - 117 U/L   AST 16 0 - 37 U/L   ALT 13 0 - 35 U/L   Total Protein 6.5 6.0 - 8.3 g/dL   Albumin 4.4 3.5 - 5.2 g/dL   GFR 29.00 >39.99 mL/min   Calcium 9.8 8.4 - 10.5 mg/dL  CBC   Collection Time: 12/17/22 12:45 PM  Result Value Ref Range   WBC 10.6 (H) 4.0 - 10.5  K/uL   RBC 4.83 3.87 - 5.11 Mil/uL   Platelets 392.0 150.0 - 400.0 K/uL   Hemoglobin 12.5 12.0 - 15.0 g/dL   HCT 61.5 63.9 - 53.9 %   MCV 79.6 78.0 - 100.0 fl   MCHC 32.6 30.0 - 36.0 g/dL   RDW 83.5 (H) 88.4 - 84.4 %  Hemoglobin A1c   Collection Time: 12/17/22 12:45 PM  Result Value Ref Range   Hgb A1c MFr Bld 6.1 4.6 - 6.5 %  TSH   Collection Time: 12/17/22 12:45 PM  Result Value Ref Range   TSH 1.64 0.35 - 5.50 uIU/mL          Assessment & Plan:   Problem List Items Addressed This Visit   None Visit Diagnoses       Abscess, dental    -  Primary   Relevant Medications   amoxicillin -clavulanate (AUGMENTIN ) 875-125 MG tablet        Assessment and Plan Assessment & Plan Periapical abscess of left lower jaw 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 Swelling present since Wednesday, visible externally. No fever reported. No recent dental intervention due to personal circumstances and financial constraints. Allergies to collagen, lactam, and tramadol. No yeast infections with short-term antibiotic use. - Prescribe Augmentin  to address the infection. - Advise monitoring for worsening symptoms or fever and seek emergency care if these occur.        Follow up plan: Return if symptoms worsen or fail to improve.

## 2024-06-30 NOTE — Telephone Encounter (Signed)
 Noted

## 2024-08-18 DIAGNOSIS — G894 Chronic pain syndrome: Secondary | ICD-10-CM | POA: Diagnosis not present

## 2024-08-18 DIAGNOSIS — M961 Postlaminectomy syndrome, not elsewhere classified: Secondary | ICD-10-CM | POA: Diagnosis not present

## 2024-08-18 DIAGNOSIS — Z0289 Encounter for other administrative examinations: Secondary | ICD-10-CM | POA: Diagnosis not present

## 2024-08-18 DIAGNOSIS — M25562 Pain in left knee: Secondary | ICD-10-CM | POA: Diagnosis not present

## 2024-08-18 DIAGNOSIS — M533 Sacrococcygeal disorders, not elsewhere classified: Secondary | ICD-10-CM | POA: Diagnosis not present

## 2024-08-18 DIAGNOSIS — M47816 Spondylosis without myelopathy or radiculopathy, lumbar region: Secondary | ICD-10-CM | POA: Diagnosis not present

## 2024-08-18 DIAGNOSIS — M4722 Other spondylosis with radiculopathy, cervical region: Secondary | ICD-10-CM | POA: Diagnosis not present

## 2024-08-29 DIAGNOSIS — J3081 Allergic rhinitis due to animal (cat) (dog) hair and dander: Secondary | ICD-10-CM | POA: Diagnosis not present

## 2024-08-29 DIAGNOSIS — J3089 Other allergic rhinitis: Secondary | ICD-10-CM | POA: Diagnosis not present

## 2024-08-29 DIAGNOSIS — R053 Chronic cough: Secondary | ICD-10-CM | POA: Diagnosis not present

## 2024-08-29 DIAGNOSIS — J301 Allergic rhinitis due to pollen: Secondary | ICD-10-CM | POA: Diagnosis not present
